# Patient Record
Sex: Female | Born: 1965 | Race: Black or African American | Hispanic: No | Marital: Single | Smoking: Never smoker
Health system: Southern US, Community
[De-identification: ages and names within clinical notes are randomized; demographics above are authoritative.]

## PROBLEM LIST (undated history)

## (undated) DIAGNOSIS — Z931 Gastrostomy status: Secondary | ICD-10-CM

## (undated) DIAGNOSIS — G8929 Other chronic pain: Secondary | ICD-10-CM

## (undated) DIAGNOSIS — G2582 Stiff-man syndrome: Secondary | ICD-10-CM

## (undated) DIAGNOSIS — I619 Nontraumatic intracerebral hemorrhage, unspecified: Secondary | ICD-10-CM

## (undated) DIAGNOSIS — E46 Unspecified protein-calorie malnutrition: Secondary | ICD-10-CM

---

## 2007-03-03 ENCOUNTER — Other Ambulatory Visit: Payer: Self-pay

## 2007-03-03 ENCOUNTER — Emergency Department: Payer: Self-pay | Admitting: Emergency Medicine

## 2007-04-06 ENCOUNTER — Emergency Department: Payer: Self-pay

## 2007-07-12 ENCOUNTER — Emergency Department: Payer: Self-pay | Admitting: Emergency Medicine

## 2007-08-04 ENCOUNTER — Inpatient Hospital Stay: Payer: Self-pay | Admitting: Internal Medicine

## 2007-08-04 ENCOUNTER — Other Ambulatory Visit: Payer: Self-pay

## 2007-08-25 ENCOUNTER — Encounter: Payer: Self-pay | Admitting: Physical Medicine & Rehabilitation

## 2007-09-01 ENCOUNTER — Encounter: Payer: Self-pay | Admitting: Physical Medicine & Rehabilitation

## 2007-09-05 ENCOUNTER — Emergency Department: Payer: Self-pay | Admitting: Emergency Medicine

## 2007-09-05 ENCOUNTER — Other Ambulatory Visit: Payer: Self-pay

## 2007-10-01 ENCOUNTER — Encounter: Payer: Self-pay | Admitting: Physical Medicine & Rehabilitation

## 2007-10-10 ENCOUNTER — Other Ambulatory Visit: Payer: Self-pay

## 2007-10-10 ENCOUNTER — Inpatient Hospital Stay: Payer: Self-pay | Admitting: Internal Medicine

## 2007-11-01 ENCOUNTER — Encounter: Payer: Self-pay | Admitting: Physical Medicine & Rehabilitation

## 2007-11-16 ENCOUNTER — Emergency Department (HOSPITAL_COMMUNITY): Admission: EM | Admit: 2007-11-16 | Discharge: 2007-11-16 | Payer: Self-pay | Admitting: Emergency Medicine

## 2008-01-23 ENCOUNTER — Emergency Department (HOSPITAL_COMMUNITY): Admission: EM | Admit: 2008-01-23 | Discharge: 2008-01-23 | Payer: Self-pay | Admitting: Emergency Medicine

## 2008-01-24 ENCOUNTER — Inpatient Hospital Stay (HOSPITAL_COMMUNITY): Admission: EM | Admit: 2008-01-24 | Discharge: 2008-01-31 | Payer: Self-pay | Admitting: Emergency Medicine

## 2008-02-01 ENCOUNTER — Emergency Department (HOSPITAL_COMMUNITY): Admission: EM | Admit: 2008-02-01 | Discharge: 2008-02-01 | Payer: Self-pay | Admitting: Emergency Medicine

## 2009-12-07 ENCOUNTER — Inpatient Hospital Stay: Payer: Self-pay | Admitting: Specialist

## 2010-10-15 NOTE — Consult Note (Signed)
Katherine Fields, Katherine Fields             ACCOUNT NO.:  [id number]   MEDICAL RECORD NO.:  [id number]          PATIENT TYPE:  INP   LOCATION:  1227                         FACILITY:  [facility]   PHYSICIAN:  Levert Feinstein, MD          DATE OF BIRTH:  [birth year]   DATE OF CONSULTATION:  01/24/2008  DATE OF DISCHARGE:                                 CONSULTATION   CHIEF COMPLAINT:  Recurrent seizure.   HISTORY OF PRESENT ILLNESS:  The patient is a 45 year old African  American female, nursing home resident, I gathered history from  reviewing previous [facility] admission note, and talking with the patient, and  her daughter at [phone].  The daughter's name is Pearletha Forge.   The patient apparently suffered epilepsy since age 69, and per [facility]  record, it was partial onset, and previous EEG in December 08, 2006 has  documented frequent spike in the right hemisphere, mostly localized to  the right temporal head region.   The patient's daughter reported  different types of seizure.  She was  running and standing for hours previously.  Currently her seizure  changed to foaming, tongue biting, whole body shaking.  The patient  denies any warning signs prior to the seizure onset.   Apparently she had frequent recurrent seizure in May 2009, was admitted  to the [facility], later discharged to current [facility] nursing facility since that admission in May.   The patient also has profound ataxic speech, was able to provide limited  history, emotional, and reluctant to elaborate on the detail.  She had a  history of gradual onset of gait difficulty, speech difficulty, and  dysphagia over the past 2 years, had extensive evaluation at [facility] and  [facility] in the past, but I do not have any of the records.  Per daughter,  she has multiple brain scans, and EEGs.  By reviewing the record,  she  has tried different medications, Dilantin, Tegretol, Keppra, Lamictal,  but upon this admission she is taking  Dilantin 200 mg b.i.d., zonegran  100mg  qhs, and also Valium 2 mg every 8 hours.   REVIEW OF SYSTEMS:  She denies chest pain, headache.  She denies visual  change.   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  Also hypertension.   PAST SURGICAL HISTORY:  Unknown.   FAMILY HISTORY:  Unknown.   CURRENT MEDICATIONS:  Including Rocephin, Valium, folic acid, Protonix,  Dilantin and thiamine, Tylenol, Ativan.  Supposed taking Zonegran 50 mg  b.i.d.   ALLERGIES:  NO KNOWN DRUG ALLERGY.   PHYSICAL EXAMINATION:  VITAL SIGNS:  Temperature was 98.7, blood  pressure 118/80, heart rate of 93, respiration of 20.  CARDIAC:  Regular rate and rhythm.  PULMONARY:  Clear to auscultation bilaterally.  NECK:  Has profound rigidity, but no carotid bruits.  NEUROLOGIC:  She is awake, alert, and cooperative on most of the history  taking and neurological examination, but has profound, ataxic speech.  Cranial nerves 2-12, pupils  were equal, round, reactive to light, I was  not able to visualize fundi.  Difficult to test visual field testing.  Smooth pursuit was broken into small catch up saccade. She also has  dysmetric saccadic movement.  There was limitation on leftward gaze,  with end-gaze nystagmus.  Seems to do better with rightward gaze.  She  has right facial droopy, moderate eye closure, cheek puff weakness.  There was no tongue atrophy, deviated to the right side, there was slow  ataxic tongue movements.  She also had scant speech.  Head turning was  rigid, but fairly symmetric.  Motor examination: normal tone of  bilateral upper extremity.  Moderate to severe spasticity of bilateral  lower extremity.  Upper extremity motor strength is at least 4+/5  proximal and distally.  Bilateral lower extremity, slow, deliberate  movement.  Motor strength is at least 4/5, proximal  distally is 5/5.  Sensory was normal to light touch, pinprick and deep tendon reflex:  hyperreflexia, and plantar responses were flexor bilaterally.  Coordination:  She has dysmetria in finger-to-nose, and mild to degree  heel-to-shin.  Gait was deferred.   CT of the brain without contrast revealed, which was essentially normal.  In specific, there is no profound brainstem/cerebellum atrophy noticed.  There was also no caudate atrophy.   LAB EVALUATION:  Lab evaluation normal CMP, and normal CBC, and CBC has  demonstrated elevated RDW of 28.6.   ASSESSMENT:  A 45 year old [place] American female with previous history  of epilepsy, complex partial, presenting with increased seizure  frequency.  She is supposed to take Dilantin 200 mg b.i.d., Valium,, and  Zonogram at nursing home.  Has tried different antiepileptic agents in  the past.   1. Dilantin would not be a good choice.  The patient already has signs      of cerebellum/brain stem dysfunction, Dilantin will  exacerbate her      abnormality, and she already had gingival hypertrophy as well,      which is a long-term adverse effect from Dilantin.  In addition it      is an enzyme inducer.  2. Keppra might be a better choice.  We will load her with 500 and      starting on 500 b.i.d.  3. Keep Zonogram 100 mg every day.  4. She has apparently underwent extensive neurological evaluation in      the past at [facility] and [facility],  no need for another round of evaluation.  5. Talk with daughter.  She is planning on followup with neurology at      [facility], Dr. Jens Som,  in the near future.      Levert Feinstein, MD  Electronically Signed     YY/MEDQ  D:  01/24/2008  T:  01/24/2008  Job:  [id number]

## 2010-10-15 NOTE — Discharge Summary (Signed)
Katherine Fields, Katherine Fields             ACCOUNT NO.:  [id number]   MEDICAL RECORD NO.:  [id number]          PATIENT TYPE:  INP   LOCATION:  1402                         FACILITY:  [facility]   PHYSICIAN:  Elliot Cousin, M.D.    DATE OF BIRTH:  1966/03/31   DATE OF ADMISSION:  01/24/2008  DATE OF DISCHARGE:  01/31/2008                               DISCHARGE SUMMARY   DISCHARGE DIAGNOSES:  1. Epilepsy/complex partial seizures with generalization and recurrent      breakthrough seizures prior to the hospitalization.  2. 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  An EEG was not performed because of the patients wig being      stuck to her scalp.  3. Central nervous system degenerative disorder of unknown etiology      represented by cognitive deficit, gait disorder, increase in      muscular tone and rigidity, and dysmetria.  The patient had been      evaluated extensively by the neurology at [facility]      in the past.  No obvious etiology was ascertained.  4. Urinary tract infection, treated completely with Rocephin during      the hospitalization.   CONSULTATIONS:  Levert Feinstein, M.D.   PROCEDURE PERFORMED:  1. Chest x-ray on January 24, 2008.  The results revealed stable      minimal chronic bronchitic changes.  No acute abnormality.  2. Bilateral rib films.  Results were negative.  3. CT scan of the head on January 23, 2008.  The results revealed      negative for bleed or other acute intracranial process.  4. X-ray of left knee on January 23, 2008.  The results revealed no      left knee acute fracture or subluxation.  5. Left hip x-ray on January 23, 2008.  The results were negative.   HISTORY OF PRESENT ILLNESS:  The patient is a 45 year old woman with a  past medical history significant for complex  partial seizures and a  central nervous system degenerative disorder who presented to the  emergency department from [facility] for  recurrent seizures.  When the patient was evaluated in the emergency  department, she was lethargic and uncooperative with the exam.  She was  afebrile.  However, she was mildly tachycardiac.  The CT scan of her  head revealed no acute intracranial abnormalities.  Her lab data were  significant for a Dilantin level of 10.1 and an urinalysis that was  consistent with infection.  The patient was admitted for further  evaluation and management.   For additional details please see the dictated history and physical.   HOSPITAL COURSE:  1. 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      The patient was placed on seizure precautions.  The dosing of the  Valium and Dilantin was increased.  The Zonegran was continued at      100 mg nightly.  For further evaluation, neurologist Dr. [name] was      consulted.  She provided her consultation on January 24, 2008.  She      recommended discontinuing the Dilantin given that the patient      already had signs of cerebellum and brain stem dysfunction.  The      Dilantin would exacerbate these abnormalities.  It was also noted      by Dr. [name] that the patient had evidence of gingival hypertrophy,      as well which is a longstanding adverse side effect from Dilantin.      Dr. [name], therefore, recommended Keppra as a better choice starting      at 500 mg b.i.d.  She concurred with continuing the Zonegran      therapy.  In review of the records from [facility], the patient      had been treated in the past with a number of anticonvulsant      medications including Dilantin, Tegretol, Keppra, and Lamictal.      Dr. [name] ordered an EEG.  However when the EEG was attempted, the      technician could not take off the  patients wig as it was stuck to      her scalp.  Therefore, the EEG was aborted.  During the      hospitalization, the patient was witnessed to have two seizures in      the first 2 to 3 days of the hospitalization.  Therefore, the      patient was given a loading dose of intravenous Keppra followed by      an increase in the dose of oral Keppra to 750 mg b.i.d.  The      Zonegran was inadvertently increased to b.i.d.  When this was      realized, the Zonegran was decreased back to 100 mg nightly.  Over      the past 24 to 48 hours, the patient has demonstrated multiple      episodes of whole body shaking.  According to the nursing staff,      during these shaking episodes, the patient is completely alert and      very often requesting Ativan; there had been no post-ictal      sedation.  These episodes were felt to represent pseudoseizures and      not actual seizures.  The patient has somewhat of an abnormal      affect and often demonstrates abnormal muscular movements.      According to the notes sent from [facility], the patient has some      sort of neurological disorder that has not really been clarified,      although apparently she has been evaluated extensively there.  The      patient has dysarthria and abnormal movements of her upper and      lower extremities.  She was noted to have difficulty with      ambulation.  Often, she has dystonic movements with increased tone.      Because of increased sleepiness and dysarthria, the dose of the      Valium was decreased back to 2 mg b.i.d. rather than 2 mg t.i.d.      P.r.n. Ativan was used sparingly throughout the hospitalization for      breakthrough seizures.  2.  URINARY TRACT INFECTION.  The patient had evidence of leukocytes in      her urine.  She was started empirically on Rocephin.  Her urine      culture grew out greater than 100,000 colonies of multiple      bacteria, none predominant.  Blood cultures were ordered and       remained negative during the hospitalization.  After 5 days of      therapy, Rocephin was discontinued.   DISCHARGE MEDICATIONS:  1. Zonegran 100 mg nightly.  2. Valium 2 mg b.i.d.  3. Keppra 750 mg b.i.d.  4. Folic acid 1 mg daily.  5. Protonix 40 mg daily.  6. MiraLax laxative 17 grams in 8 ounces of favorite beverage daily.  7. Vitamin B1 (thiamine) 100 mg daily.  8. Tylenol 650 mg every 4 hours as needed for pain and fever.  9. Ativan 0.5 mg IM/p.o. t.i.d. p.r.n.  10.Stop Dilantin.   DISCHARGE DISPOSITION:  The patient is currently stable.  The plan is to  discharge her to [facility] tomorrow for  ongoing monitoring and therapies.      Elliot Cousin, M.D.  Electronically Signed     DF/MEDQ  D:  01/30/2008  T:  01/30/2008  Job:  [id number]   cc:   Frederica Kuster, MD  Fax: [phone]   Levert Feinstein, MD

## 2010-10-15 NOTE — H&P (Signed)
Katherine Fields, KISHBAUGH             ACCOUNT NO.:  [id number]   MEDICAL RECORD NO.:  [id number]          PATIENT TYPE:  INP   LOCATION:  0104                         [facility]ACILITY:  [facility]   PHYSICIAN:  Lonia Blood, M.D.       DATE OF BIRTH:  28-Jun-1965   DATE OF ADMISSION:  01/24/2008  DATE OF DISCHARGE:                              HISTORY & PHYSICAL   PRIMARY CARE PHYSICIAN:  Dr. Jacalyn Lefevre   CHIEF COMPLAINT:  Seizure.   HISTORY OF PRESENT ILLNESS:  [name] is a 45 year old woman with  history of epilepsy since age 26, who has been a longstanding patient of  the [facility] Neurology Department, who was sent today to the  emergency room from [facility] where she was  admitted for rehab back in May 2009.  Unfortunately, I have no records  to indicate where she came from to that nursing home or what was the  reason for her qualifying inpatient stay.  The patient is currently  awake, but she displays very disorganized thinking, and she is unable to  provide any history.  I have asked the skilled nursing facility to fax  me all the records but half an hour later, I still have no response.  I  have attempted to call the patient's contact persons listed on the  sheet, and finally I was able to find a sister by the name of Katherine Fields with  a phone number of [phone], who tells me that [name] has had  significant gait disturbance, cognitive deficits, and basically  uncontrolled epilepsy for years and that she would like her to be  referred to [facility] for the brain surgery.  Otherwise, she tells me  that even on a good day, [name] is not fully alert, oriented, or  able to have a good and full conversation with her family.  As she is  not at the bedside, she cannot tell me right now how far from her  baseline [name] is.   PAST MEDICAL HISTORY:  1. Epilepsy.  2. Hypertension.   MEDICATION AT THE NURSING HOME:  Valium, Dilantin, Zonegran,  MiraLax,  and Tylenol.   SOCIAL HISTORY:  The patient denies smoking cigarettes or drinking  alcohol.  She has a listed daughter, but I do not know if she has ever  been married.  The patient supposedly worked in a factory, but I do not  have any details regarding that.   FAMILY HISTORY:  Unobtainable.   REVIEW OF SYSTEMS:  The patient denies any pain.  She will like to be  left alone and denies any nausea, vomiting, and she is able to eat the  happy meal provided by the emergency room staff.   PHYSICAL EXAM UPON ADMISSION:  Temperature is 98.8, blood pressure  131/81, heart rate 110, respiratory rate is 21 saturation of 99% on room  air.  GENERAL APPEARANCE:  An African American woman in some moderate  distress.  She seems to be agitated, disorganized, unable to focus.  Pupils are mydriatic and reactive to light.  There is  no tonic-clonic  movements seen.  The patient is not oriented to place or situation.  She  does not know the month, date or time and appears normocephalic,  atraumatic.  Throat clear.  NECK:  Supple.  No JVD.  CHEST:  Clear without wheezes, rhonchi or crackles.  HEART:  Regular rate and rhythm without murmurs, rubs or gallops.  ABDOMEN:  Soft, nontender.  Bowel sounds are present.  LOWER EXTREMITIES:  Without edema.  Unable to perform a neurological exam.  I am unable to perform a full  exam due to the patient's uncooperation.  She seems to be having some  dystonic movements with her head, and she will cooperate some by  squeezing my finger with both hands.  I would did not ambulate the  patient.   LABORATORY VALUES ON ADMISSION:  White blood cell count is 5000,  hemoglobin 12, platelet count 229, phenytoin 10, sodium 136, potassium  3.5, chloride 107, glucose 71, BUN 9, creatinine 1, bicarbonate 22.  LFTs within normal limits.  UA positive for leukocyte esterase and  bacteria.   ASSESSMENT/PLAN:  1. Seizures - rule out status epilepticus.  The patient  does not seem      to be returning back to a state of complete consciousness.  I am      afraid that she might be having some ongoing partial seizures      versus she has significant cognitive and neurological deficits.      Obviously the whole exam and evaluation will have to be tailored      once I get the family members to tell me about the patient's      baseline.  I am also trying hard to get records from [facility] and [facility] to further help me      out for this patient.  For now, I will increase the patient's      Valium, Dilantin, continue her Zonegran and use Ativan as needed      for obvious tonic-clonic seizures.  I have asked to consult a      neurologist to see the patient in the emergency room, and I also      ordered an EEG.  2. Possible urinary tract infection.  I will send the urine culture      and placed empirically the patient on Rocephin.  3. Deep venous thrombosis prophylaxis will be done using PAS hoses.      Lonia Blood, M.D.  Electronically Signed     SL/MEDQ  D:  01/24/2008  T:  01/24/2008  Job:  [id number]

## 2011-02-27 LAB — COMPREHENSIVE METABOLIC PANEL
BUN: 8
CO2: 24
Calcium: 9.2
Chloride: 104
Creatinine, Ser: 1.07
GFR calc Af Amer: >60
GFR calc non Af Amer: 57 — ABNORMAL LOW
Glucose, Bld: 71
Total Bilirubin: 0.4

## 2011-02-27 LAB — CBC
HCT: 29.8 — ABNORMAL LOW
Hemoglobin: 9.7 — ABNORMAL LOW
MCHC: 32.5
MCV: 74.5 — ABNORMAL LOW
RBC: 4

## 2011-02-27 LAB — DIFFERENTIAL
Basophils Absolute: 0
Lymphocytes Relative: 19
Lymphs Abs: 0.8
Neutro Abs: 3.1
Neutrophils Relative %: 72

## 2011-02-27 LAB — URINALYSIS, ROUTINE W REFLEX MICROSCOPIC
Glucose, UA: NEGATIVE
Nitrite: NEGATIVE
Protein, ur: 30 — AB

## 2011-02-27 LAB — PHENYTOIN LEVEL, TOTAL: Phenytoin Lvl: 12

## 2011-02-27 LAB — URINE MICROSCOPIC-ADD ON

## 2011-03-05 LAB — DIFFERENTIAL
Basophils Relative: 1
Eosinophils Absolute: 0
Eosinophils Relative: 0
Lymphs Abs: 0.9
Monocytes Absolute: 0.2
Neutrophils Relative %: 75

## 2011-03-05 LAB — COMPREHENSIVE METABOLIC PANEL
ALT: 22
AST: 20
Alkaline Phosphatase: 74
CO2: 25
Calcium: 9.7
GFR calc Af Amer: >60
Glucose, Bld: 106 — ABNORMAL HIGH
Potassium: 3.7
Sodium: 139
Total Protein: 7.3

## 2011-03-05 LAB — CBC
Hemoglobin: 13
RBC: 4.97

## 2011-03-05 LAB — PHENYTOIN LEVEL, TOTAL: Phenytoin Lvl: <2.5 — ABNORMAL LOW

## 2011-03-05 LAB — GLUCOSE, CAPILLARY: Glucose-Capillary: 69 — ABNORMAL LOW

## 2011-09-21 ENCOUNTER — Emergency Department: Payer: Self-pay | Admitting: Emergency Medicine

## 2011-09-21 LAB — COMPREHENSIVE METABOLIC PANEL
Albumin: 4 g/dL (ref 3.4–5.0)
Alkaline Phosphatase: 87 U/L (ref 50–136)
Bilirubin,Total: 0.4 mg/dL (ref 0.2–1.0)
Creatinine: 1.23 mg/dL (ref 0.60–1.30)
EGFR (African American): >60
EGFR (Non-African Amer.): 53 — ABNORMAL LOW
Osmolality: 280 (ref 275–301)

## 2011-09-21 LAB — URINALYSIS, COMPLETE
Bilirubin,UR: NEGATIVE
Glucose,UR: NEGATIVE mg/dL (ref 0–75)
Leukocyte Esterase: NEGATIVE
Nitrite: NEGATIVE
Ph: 5 (ref 4.5–8.0)

## 2011-09-21 LAB — CBC
HCT: 27 % — ABNORMAL LOW (ref 35.0–47.0)
RBC: 3.8 10*6/uL (ref 3.80–5.20)
WBC: 7.5 10*3/uL (ref 3.6–11.0)

## 2011-09-21 LAB — ETHANOL
Ethanol %: <0.003 % (ref 0.000–0.080)
Ethanol: <3 mg/dL

## 2011-09-21 LAB — TROPONIN I: Troponin-I: 0.04 ng/mL

## 2011-09-21 LAB — PROTIME-INR: Prothrombin Time: 14 secs (ref 11.5–14.7)

## 2011-10-21 ENCOUNTER — Emergency Department: Payer: Self-pay | Admitting: Emergency Medicine

## 2011-10-21 LAB — CBC WITH DIFFERENTIAL/PLATELET
Basophil: 1 %
Comment - H1-Com4: NORMAL
MCH: 22.4 pg — ABNORMAL LOW (ref 26.0–34.0)
MCV: 73 fL — ABNORMAL LOW (ref 80–100)
Monocytes: 6 %
Platelet: 378 10*3/uL (ref 150–440)
Segmented Neutrophils: 80 %
WBC: 8.6 10*3/uL (ref 3.6–11.0)

## 2011-10-21 LAB — BASIC METABOLIC PANEL
BUN: 11 mg/dL (ref 7–18)
Co2: 24 mmol/L (ref 21–32)
EGFR (African American): >60
EGFR (Non-African Amer.): >60
Potassium: 3.9 mmol/L (ref 3.5–5.1)
Sodium: 137 mmol/L (ref 136–145)

## 2011-10-22 LAB — URINALYSIS, COMPLETE
Bacteria: NONE SEEN
Bilirubin,UR: NEGATIVE
Blood: NEGATIVE
Glucose,UR: NEGATIVE mg/dL (ref 0–75)
Ketone: NEGATIVE
Leukocyte Esterase: NEGATIVE
Nitrite: NEGATIVE
Ph: 8 (ref 4.5–8.0)
Protein: NEGATIVE
RBC,UR: <1 /HPF (ref 0–5)
Specific Gravity: 1.006 (ref 1.003–1.030)
Squamous Epithelial: 2
WBC UR: <1 /HPF (ref 0–5)

## 2011-10-31 ENCOUNTER — Emergency Department: Payer: Self-pay | Admitting: Emergency Medicine

## 2011-10-31 LAB — CBC
HCT: 28.6 % — ABNORMAL LOW (ref 35.0–47.0)
HGB: 8.7 g/dL — ABNORMAL LOW (ref 12.0–16.0)
MCHC: 30.3 g/dL — ABNORMAL LOW (ref 32.0–36.0)
MCV: 72 fL — ABNORMAL LOW (ref 80–100)

## 2011-10-31 LAB — COMPREHENSIVE METABOLIC PANEL
Anion Gap: 13 (ref 7–16)
BUN: 9 mg/dL (ref 7–18)
Bilirubin,Total: 0.3 mg/dL (ref 0.2–1.0)
Calcium, Total: 9.3 mg/dL (ref 8.5–10.1)
Co2: 22 mmol/L (ref 21–32)
Creatinine: 0.87 mg/dL (ref 0.60–1.30)
EGFR (Non-African Amer.): >60
Potassium: 3.9 mmol/L (ref 3.5–5.1)
SGOT(AST): 18 U/L (ref 15–37)
Total Protein: 8 g/dL (ref 6.4–8.2)

## 2011-10-31 LAB — URINALYSIS, COMPLETE
Hyaline Cast: 12
Leukocyte Esterase: NEGATIVE
Nitrite: NEGATIVE
Protein: 100
RBC,UR: 1 /HPF (ref 0–5)
Squamous Epithelial: 3

## 2011-10-31 LAB — PHENYTOIN LEVEL, TOTAL: Dilantin: <0.4 ug/mL — ABNORMAL LOW (ref 10.0–20.0)

## 2012-10-06 DIAGNOSIS — Z931 Gastrostomy status: Secondary | ICD-10-CM

## 2014-03-02 DIAGNOSIS — I69359 Hemiplegia and hemiparesis following cerebral infarction affecting unspecified side: Secondary | ICD-10-CM

## 2014-10-03 ENCOUNTER — Encounter: Payer: Self-pay | Admitting: Emergency Medicine

## 2014-10-03 ENCOUNTER — Emergency Department: Payer: Medicaid Other

## 2014-10-03 ENCOUNTER — Emergency Department
Admission: EM | Admit: 2014-10-03 | Discharge: 2014-10-03 | Disposition: A | Discharge status: Home / Self Care | Payer: Medicaid Other | Attending: Emergency Medicine | Admitting: Emergency Medicine

## 2014-10-03 DIAGNOSIS — Z431 Encounter for attention to gastrostomy: Secondary | ICD-10-CM

## 2014-10-03 DIAGNOSIS — T85528A Displacement of other gastrointestinal prosthetic devices, implants and grafts, initial encounter: Secondary | ICD-10-CM

## 2014-10-03 DIAGNOSIS — K9429 Other complications of gastrostomy: Secondary | ICD-10-CM | POA: Insufficient documentation

## 2014-10-03 HISTORY — DX: Gastrostomy status: Z93.1

## 2014-10-03 HISTORY — DX: Stiff-man syndrome: G25.82

## 2014-10-03 MED ORDER — DIATRIZOATE MEGLUMINE & SODIUM 66-10 % PO SOLN
30.0000 mL | Freq: Once | ORAL | Status: AC
  Administered 2014-10-03: 30 mL

## 2014-10-03 NOTE — ED Provider Notes (Signed)
[facility] Emergency Department Provider Note   Time seen: 8:30 PM  I have reviewed the triage vital signs and the nursing notes. Level V caveat. Exam limited by nonverbal status.  HISTORY  Chief Complaint GI Problem    HPI Katherine Fields is a 49 y.o. female who is brought in by EMS from home for G-tube displacement. According to EMS, for patient is nonverbal, the tube was displaced from 1930 tonight. The original size 20 [place]. Patient states she is not in any pain by nodding her head. G-tubes load located in the upper abdomen. She does not take food or liquid by mouth requires tube feeds for everything.    No past medical history on file.  There are no active problems to display for this patient.   No past surgical history on file.  No current outpatient prescriptions on file.  Allergies Review of patient's allergies indicates not on file.  No family history on file.  Social History History  Substance Use Topics  . Smoking status: Not on file  . Smokeless tobacco: Not on file  . Alcohol Use: Not on file    Review of Systems Unknown, patient states she is not in any pain by nodding her head  ____________________________________________   PHYSICAL EXAM:  VITAL SIGNS: ED Triage Vitals  Enc Vitals Group     BP 10/03/14 2035 157/97 mmHg     Pulse Rate 10/03/14 2035 64     Resp 10/03/14 2035 16     Temp 10/03/14 2035 97.6 F (36.4 C)     Temp Source 10/03/14 2035 Oral     SpO2 10/03/14 2035 97 %     Weight 10/03/14 2035 126 lb (57.153 kg)     Height 10/03/14 2035  (1.651 m)     Head Cir --      Peak Flow --      Pain Score --      Pain Loc --      Pain Edu? --      Excl. in GC? --     Constitutional: Alert and oriented. Well appearing and in no distress. Eyes: Conjunctivae are normal. PERRL. Normal extraocular movements. ENT    Mouth/Throat: Mucous membranes are moist.   Neck: No stridor.  Cardiovascular:  Normal rate, regular rhythm. Normal and symmetric distal pulses are present in all extremities. No murmurs, rubs, or gallops. Respiratory: Normal respiratory effort without tachypnea nor retractions. Breath sounds are clear and equal bilaterally. No wheezes/rales/rhonchi. Gastrointestinal: Soft and nontender. G-tube site appears to be rapidly closing  Musculoskeletal: Nontender with normal range of motion in all extremities. No joint effusions.  No lower extremity tenderness nor edema. Neurologic:  Nonverbal  Skin:  Skin is warm, dry and intact. No rash noted.   ____________________________________________    LABS (pertinent positives/negatives)  None  ____________________________________________  20 French G-tube was replaced with some difficulty and secured in proper position after inflating the balloon with 20 cc of normal saline. X-ray was ordered which confirmed proper placement.     RADIOLOGY  KUB confirms G-tube placement  ____________________________________________    IMPRESSION / ASSESSMENT AND PLAN / ED COURSE  Pertinent labs & imaging results that were available during my care of the patient were reviewed by me and considered in my medical decision making (see chart for details).  G-tube dislodgment and replacement.  Patient presents for G-tube replacement which was placed with some difficulty were was successful. Stable for outpatient follow-up  Emily FilbertWilliams, Jonathan E, MD   Emily FilbertJonathan E Klumpp, MD 10/03/14 2125

## 2014-10-03 NOTE — Discharge Instructions (Signed)
Gastric Tube Replacement °You are having your gastric tube (the tube that goes into the stomach) changed. This is usually a very minor procedure. If medications are prescribed, take them as directed. Only take over-the-counter or prescription medications for pain, discomfort, or fever as directed by your caregiver.  °SEEK IMMEDIATE MEDICAL CARE IF:  °· You develop chills, fever, or show signs of generalized illness. °· You develop bleeding around the tube. °· Your new tube does not seem to be working properly. °· You are unable to get feedings into the tube. °· Your tube comes out for any reason. °Document Released: 02/11/2001 Document Revised: 08/11/2011 Document Reviewed: 05/19/2005 °ExitCare® Patient Information ©2015 [employer]. This information is not intended to replace advice given to you by your health care provider. Make sure you discuss any questions you have with your health care provider. ° °

## 2014-10-03 NOTE — ED Notes (Signed)
Pt to ED via EMS from home c/o g-tube displacement.  EMS states displaced around 1930 tonight, original size 71F.

## 2014-10-03 NOTE — ED Notes (Signed)
XR at bedside

## 2014-10-18 DIAGNOSIS — I69391 Dysphagia following cerebral infarction: Secondary | ICD-10-CM

## 2015-04-11 ENCOUNTER — Emergency Department: Payer: Medicaid Other

## 2015-04-11 ENCOUNTER — Emergency Department
Admission: EM | Admit: 2015-04-11 | Discharge: 2015-04-11 | Disposition: A | Discharge status: Home / Self Care | Payer: Medicaid Other | Attending: Emergency Medicine | Admitting: Emergency Medicine

## 2015-04-11 DIAGNOSIS — Z431 Encounter for attention to gastrostomy: Secondary | ICD-10-CM | POA: Diagnosis not present

## 2015-04-11 DIAGNOSIS — Z79899 Other long term (current) drug therapy: Secondary | ICD-10-CM | POA: Insufficient documentation

## 2015-04-11 DIAGNOSIS — Z931 Gastrostomy status: Secondary | ICD-10-CM

## 2015-04-11 DIAGNOSIS — R109 Unspecified abdominal pain: Secondary | ICD-10-CM | POA: Diagnosis present

## 2015-04-11 LAB — CBC WITH DIFFERENTIAL/PLATELET
BASOS ABS: 0 10*3/uL (ref 0–0.1)
Basophils Relative: 1 %
EOS ABS: 0 10*3/uL (ref 0–0.7)
Eosinophils Relative: 0 %
HCT: 36.7 % (ref 35.0–47.0)
HEMOGLOBIN: 12.1 g/dL (ref 12.0–16.0)
LYMPHS ABS: 1.2 10*3/uL (ref 1.0–3.6)
Lymphocytes Relative: 21 %
MCH: 29.4 pg (ref 26.0–34.0)
MCHC: 33 g/dL (ref 32.0–36.0)
MCV: 89.2 fL (ref 80.0–100.0)
Monocytes Absolute: 0.3 10*3/uL (ref 0.2–0.9)
Monocytes Relative: 6 %
NEUTROS PCT: 72 %
Neutro Abs: 4.3 10*3/uL (ref 1.4–6.5)
PLATELETS: 214 10*3/uL (ref 150–440)
RBC: 4.11 MIL/uL (ref 3.80–5.20)
RDW: 14 % (ref 11.5–14.5)
WBC: 5.9 10*3/uL (ref 3.6–11.0)

## 2015-04-11 LAB — COMPREHENSIVE METABOLIC PANEL
ALK PHOS: 72 U/L (ref 38–126)
ALT: 8 U/L — AB (ref 14–54)
AST: 24 U/L (ref 15–41)
Albumin: 3.7 g/dL (ref 3.5–5.0)
Anion gap: 6 (ref 5–15)
BUN: 14 mg/dL (ref 6–20)
CALCIUM: 9.8 mg/dL (ref 8.9–10.3)
CO2: 29 mmol/L (ref 22–32)
CREATININE: 0.88 mg/dL (ref 0.44–1.00)
Chloride: 104 mmol/L (ref 101–111)
GFR calc Af Amer: >60 mL/min (ref >60)
GFR calc non Af Amer: >60 mL/min (ref >60)
Glucose, Bld: 78 mg/dL (ref 65–99)
Potassium: 5.3 mmol/L — ABNORMAL HIGH (ref 3.5–5.1)
Sodium: 139 mmol/L (ref 135–145)
TOTAL PROTEIN: 7.4 g/dL (ref 6.5–8.1)
Total Bilirubin: 0.7 mg/dL (ref 0.3–1.2)

## 2015-04-11 LAB — URINALYSIS COMPLETE WITH MICROSCOPIC (ARMC ONLY)
Bilirubin Urine: NEGATIVE
Glucose, UA: NEGATIVE mg/dL
Hgb urine dipstick: NEGATIVE
KETONES UR: NEGATIVE mg/dL
Leukocytes, UA: NEGATIVE
Nitrite: NEGATIVE
PROTEIN: NEGATIVE mg/dL
RBC / HPF: NONE SEEN RBC/hpf (ref 0–5)
Specific Gravity, Urine: 1.019 (ref 1.005–1.030)
Squamous Epithelial / LPF: NONE SEEN
pH: 7 (ref 5.0–8.0)

## 2015-04-11 LAB — LIPASE, BLOOD: Lipase: 38 U/L (ref 11–51)

## 2015-04-11 MED ORDER — HYDROMORPHONE HCL 1 MG/ML IJ SOLN
1.0000 mg | Freq: Once | INTRAMUSCULAR | Status: AC
  Administered 2015-04-11: 1 mg via INTRAVENOUS
  Filled 2015-04-11: qty 1

## 2015-04-11 MED ORDER — ONDANSETRON 4 MG PO TBDP
4.0000 mg | ORAL_TABLET | Freq: Once | ORAL | Status: AC
  Administered 2015-04-11: 4 mg via ORAL
  Filled 2015-04-11: qty 1

## 2015-04-11 MED ORDER — LORAZEPAM 2 MG/ML IJ SOLN
2.0000 mg | Freq: Once | INTRAMUSCULAR | Status: AC
  Administered 2015-04-11: 2 mg via INTRAMUSCULAR
  Filled 2015-04-11: qty 1

## 2015-04-11 NOTE — ED Notes (Signed)
Bladder scan completed per MD request.  106 ml noted

## 2015-04-11 NOTE — Care Management Note (Signed)
Case Management Note  Patient Details  Name: Katherine Fields MRN: [id number] Date of Birth: 25-Sep-1965  Subjective/Objective:     Contacted daughter of patient, Tamera PuntMiranda by calling her At [phone] . She let the phone ring and answered my VM left. Dr Huel CoteQuigley asked if the daughter will come get the patient, or come sit with her. She says she will come to get the patient.              Action/Plan:   Expected Discharge Date:                  Expected Discharge Plan:     In-House Referral:     Discharge planning Services     Post Acute Care Choice:    Choice offered to:     DME Arranged:    DME Agency:     HH Arranged:    HH Agency:     Status of Service:     Medicare Important Message Given:    Date Medicare IM Given:    Medicare IM give by:    Date Additional Medicare IM Given:    Additional Medicare Important Message give by:     If discussed at Long Length of Stay Meetings, dates discussed:    Additional Comments:  Berna BueCheryl Brailon Don, RN 04/11/2015, 1:54 PM

## 2015-04-11 NOTE — ED Notes (Signed)
Patient brought in via EMS from home for abdominal pain and G-tube "falling out".

## 2015-04-11 NOTE — ED Notes (Signed)
Spoke with indo, for Kangaroo [id number], 20cc G-Tube. Indo to call back.

## 2015-04-11 NOTE — Discharge Instructions (Signed)
Gastrostomy Tube Home Guide, Adult °A gastrostomy tube is a tube that is surgically placed into the stomach. It is also called a "G-tube." G-tubes are used when a person is unable to eat and drink enough on their own to stay healthy. The tube is inserted into the stomach through a small cut (incision) in the skin. This tube is used for: °· Feeding. °· Giving medication. °GASTROSTOMY TUBE CARE °· Wash your hands with soap and water. °· Remove the old dressing (if any). Some styles of G-tubes may need a dressing inserted between the skin and the G-tube. Other types of G-tubes do not require a dressing. Ask your health care provider if a dressing is needed. °· Check the area where the tube enters the skin (insertion site) for redness, swelling, or pus-like (purulent) drainage. A small amount of clear or tan liquid drainage is normal. Check to make sure scar tissue (skin) is not growing around the insertion site. This could have a raised, bumpy appearance. °· A cotton swab can be used to clean the skin around the tube: °¨ When the G-tube is first put in, a normal saline solution or water can be used to clean the skin. °¨ Mild soap and warm water can be used when the skin around the G-tube site has healed. °¨ Roll the cotton swab around the G-tube insertion site to remove any drainage or crusting at the insertion site. °STOMACH RESIDUALS °Feeding tube residuals are the amount of liquids that are in the stomach at any given time. Residuals may be checked before giving feedings, medications, or as instructed by your health care provider. °· Ask your health care provider if there are instances when you would not start tube feedings depending on the amount or type of contents withdrawn from the stomach. °· Check residuals by attaching a syringe to the G-tube and pulling back on the syringe plunger. Note the amount, and return the residual back into the stomach. °FLUSHING THE G-TUBE °· The G-tube should be periodically  flushed with clean warm water to keep it from clogging. °¨ Flush the G-tube after feedings or medications. Draw up 30 mL of warm water in a syringe. Connect the syringe to the G-tube and slowly push the water into the tube. °¨ Do not push feedings, medications, or flushes rapidly. Flush the G-tube gently and slowly. °¨ Only use syringes made for G-tubes to flush medications or feedings. °¨ Your health care provider may want the G-tube flushed more often or with more water. If this is the case, follow your health care provider's instructions. °FEEDINGS °Your health care provider will determine whether feedings are given as a bolus (a certain amount given at one time and at scheduled times) or whether feedings will be given continuously on a feeding pump.  °· Formulas should be given at room temperature. °· If feedings are continuous, no more than 4 hours worth of feedings should be placed in the feeding bag. This helps prevent spoilage or accidental excess infusion. °· Cover and place unused formula in the refrigerator. °· If feedings are continuous, stop the feedings when medications or flushes are given. Be sure to restart the feedings. °· Feeding bags and syringes should be replaced as instructed by your health care provider. °GIVING MEDICATION  °· In general, it is best if all medications are in a liquid form for G-tube administration. Liquid medications are less likely to clog the G-tube. °¨ Mix the liquid medication with 30 mL (or amount recommended by   your health care provider) of warm water. °¨ Draw up the medication into the syringe. °¨ Attach the syringe to the G-tube and slowly push the mixture into the G-tube. °¨ After giving the medication, draw up 30 mL of warm water in the syringe and slowly flush the G-tube. °· For pills or capsules, check with your health care provider first before crushing medications. Some pills are not effective if they are crushed. Some capsules are sustained-release  medications. °¨ If appropriate, crush the pill or capsule and mix with 30 mL of warm water. Using the syringe, slowly push the medication through the tube, then flush the tube with another 30 mL of tap water. °G-TUBE PROBLEMS °G-tube was pulled out. °· Cause: May have been pulled out accidentally. °· Solutions: Cover the opening with clean dressing and tape. Call your health care provider right away. The G-tube should be put in as soon as possible (within 4 hours) so the G-tube opening (tract) does not close. The G-tube needs to be put in at a health care setting. An X-ray needs to be done to confirm placement before the G-tube can be used again. °Redness, irritation, soreness, or foul odor around the gastrostomy site. °· Cause: May be caused by leakage or infection. °· Solutions: Call your health care provider right away. °Large amount of leakage of fluid or mucus-like liquid present (a large amount means it soaks clothing). °· Cause: Many reasons could cause the G-tube to leak. °· Solutions: Call your health care provider to discuss the amount of leakage. °Skin or scar tissue appears to be growing where tube enters skin.  °· Cause: Tissue growth may develop around the insertion site if the G-tube is moved or pulled on excessively. °· Solutions: Secure tube with tape so that excess movement does not occur. Call your health care provider. °G-tube is clogged. °· Cause: Thick formula or medication. °· Solutions: Try to slowly push warm water into the tube with a large syringe. Never try to push any object into the tube to unclog it. Do not force fluid into the G-tube. If you are unable to unclog the tube, call your health care provider right away. °TIPS °· Head of bed (HOB) position refers to the upright position of a person's upper body. °¨ When giving medications or a feeding bolus, keep the HOB up as told by your health care provider. Do this during the feeding and for 1 hour after the feeding or medication  administration. °¨ If continuous feedings are being given, it is best to keep the HOB up as told by your health care provider. When ADLs (activities of daily living) are performed and the HOB needs to be flat, be sure to turn the feeding pump off. Restart the feeding pump when the HOB is returned to the recommended height. °· Do not pull or put tension on the tube. °· To prevent fluid backflow, kink the G-tube before removing the cap or disconnecting a syringe. °· Check the G-tube length every day. Measure from the insertion site to the end of the G-tube. If the length is longer than previous measurements, the tube may be coming out. Call your health care provider if you notice increasing G-tube length. °· Oral care, such as brushing teeth, must be continued. °· You may need to remove excess air (vent) from the G-tube. Your health care provider will tell you if this is needed. °· Always call your health care provider if you have questions or problems with the   G-tube. SEEK IMMEDIATE MEDICAL CARE IF:   You have severe abdominal pain, tenderness, or abdominal bloating (distension).  You have nausea or vomiting.  You are constipated or have problems moving your bowels.  The G-tube insertion site is red, swollen, has a foul smell, or has yellow or brown drainage.  You have difficulty breathing or shortness of breath.  You have a fever.  You have a large amount of feeding tube residuals.  The G-tube is clogged and cannot be flushed. MAKE SURE YOU:   Understand these instructions.  Will watch your condition.  Will get help right away if you are not doing well or get worse.   This information is not intended to replace advice given to you by your health care provider. Make sure you discuss any questions you have with your health care provider.   Document Released: 07/28/2001 Document Revised: 10/03/2014 Document Reviewed: 01/24/2013 Elsevier Interactive Patient Education [employer]2016 [employer]  Please return immediately if condition worsens. Please contact her primary physician or the physician you were given for referral. If you have any specialist physicians involved in her treatment and plan please also contact them. Thank you for using [facility].

## 2015-04-11 NOTE — ED Notes (Signed)
Pt very tense in room, g-tube placement by MD x2 unsuccessful, IM medications given.

## 2015-04-11 NOTE — ED Notes (Signed)
New G-Tube placement by Dr. Huel CoteQuigley. 16F, 20cc balloon. 2 stitches placed to secure placement.

## 2015-04-11 NOTE — ED Provider Notes (Signed)
Time Seen: Approximately 11 AM  I have reviewed the triage notes  Chief Complaint: Abdominal Pain   History of Present Illness: Katherine Fields is a 49 y.o. 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 Patient apparently had her G-tube out "" fell out "" at home. Patient denies any other physical complaints at this point. She denies any nausea or vomiting though she is a difficult historian and there was no family members to speak with at the bedside. There's been no history stated of fever, persistent vomiting, or any other concerns. The G-tube apparently "" fell out" "" this morning. Examination of the G-tube shows a balloon appears to have ruptured.  Past Medical History  Diagnosis Date  . Stiffman syndrome   . G tube feedings (HCC)     There are no active problems to display for this patient.   History reviewed. No pertinent past surgical history.  History reviewed. No pertinent past surgical history.  Current Outpatient Rx  Name  Route  Sig  Dispense  Refill  . baclofen (LIORESAL) 10 MG tablet   Oral   Take 10 mg by mouth every 6 (six) hours.         . diazepam (VALIUM) 1 MG/ML solution   Per Tube   Place 2 mg into feeding tube every 8 (eight) hours as needed for anxiety.         . DULoxetine (CYMBALTA) 60 MG capsule   Oral   Take 60 mg by mouth daily.         . famotidine (PEPCID) 20 MG tablet   Oral   Take 20 mg by mouth 2 (two) times daily.         Marland Kitchen lacosamide (VIMPAT) 10 MG/ML oral solution   Oral   Take 150 mg by mouth 2 (two) times daily.         Marland Kitchen levETIRAcetam (KEPPRA) 100 MG/ML solution   Oral   Take 750 mg by mouth 2 (two) times daily.         Marland Kitchen lisinopril (PRINIVIL,ZESTRIL) 10 MG tablet   Oral   Take 10 mg by mouth daily.         Marland Kitchen ZONISAMIDE PO   Per Tube   Place 20 mLs into feeding tube 2 (two) times daily. Zonisamide /ml give 20 mls by G tube twice daily           Allergies:  Review of patient's  allergies indicates no known allergies.  Family History: History reviewed. No pertinent family history.  Social History: Social History  Substance Use Topics  . Smoking status: Never Smoker   . Smokeless tobacco: None  . Alcohol Use: No     Review of Systems:   10 point review of systems was performed and was otherwise negative:  Constitutional: No fever Eyes: No visual disturbances ENT: No sore throat, ear pain Cardiac: No chest pain Respiratory: No shortness of breath, wheezing, or stridor Abdomen: No abdominal pain, no vomiting, No diarrhea Endocrine: No weight loss, No night sweats Extremities: No peripheral edema, cyanosis Skin: No rashes, easy bruising Neurologic: No focal weakness, trouble with speech or swollowing Urologic: No dysuria, Hematuria, or urinary frequency No obvious new findings on review of systems is difficult to establish with the patient's baseline is but further discussion with the family and after they arrived did not show any other abnormalities  Physical Exam:  ED Triage Vitals  Enc Vitals Group     BP  04/11/15 1040 145/79 mmHg     Pulse Rate 04/11/15 1040 78     Resp 04/11/15 1040 15     Temp 04/11/15 1040 97.9 F (36.6 C)     Temp Source 04/11/15 1040 Oral     SpO2 04/11/15 1040 100 %     Weight 04/11/15 1040 115 lb (52.164 kg)     Height 04/11/15 1040 5\' 5"  (1.651 m)     Head Cir --      Peak Flow --      Pain Score --      Pain Loc --      Pain Edu? --      Excl. in GC? --     General: Awake , Alert , and Oriented times 3; GCS 15 Head: Normal cephalic , atraumatic Eyes: Pupils equal , round, reactive to light Nose/Throat: No nasal drainage, patent upper airway without erythema or exudate.  Neck: Supple, Full range of motion, No anterior adenopathy or palpable thyroid masses Lungs: Clear to ascultation without wheezes , rhonchi, or rales Heart: Regular rate, regular rhythm without murmurs , gallops , or rubs Abdomen: Soft, non  tender without rebound, guarding , or rigidity; bowel sounds positive and symmetric in all 4 quadrants. No organomegaly .        Extremities: 2 plus symmetric pulses. No edema, clubbing or cyanosis Neurologic: normal ambulation, Motor symmetric without deficits, sensory intact Skin: warm, dry, no rashes   Labs:   All laboratory work was reviewed including any pertinent negatives or positives listed below:  Labs Reviewed  COMPREHENSIVE METABOLIC PANEL - Abnormal; Notable for the following:    Potassium 5.3 (*)    ALT 8 (*)    All other components within normal limits  URINALYSIS COMPLETEWITH MICROSCOPIC ([facility] ONLY) - Abnormal; Notable for the following:    Color, Urine YELLOW (*)    APPearance CLOUDY (*)    Bacteria, UA RARE (*)    All other components within normal limits  CBC WITH DIFFERENTIAL/PLATELET  LIPASE, BLOOD  CBC WITH DIFFERENTIAL/PLATELET   Patient's otherwise hemodynamically stable  Radiology: EXAM: DG ABDOMEN ACUTE W/ 1V CHEST  COMPARISON: 10/03/2014  FINDINGS: There is no evidence of dilated bowel loops or free intraperitoneal air. No radiopaque calculi or other significant radiographic abnormality is seen. Gastrostomy tube over the left upper quadrant.  Heart size and mediastinal contours are within normal limits. Both lungs are clear.  IMPRESSION: Negative abdominal radiographs. No acute cardiopulmonary disease.   Pending is a contrast study and a three-way of the abdomen to make sure the NG tube in the right position I personally reviewed the radiologic studies   Procedures:  G-tube replacement The patient's previous G-tube was checked and appeared to have a balloon down and was a 16 [place] tube. Patient had attempted trying to just put the 3116 [place] new G-tube in place but ran into obstruction. We tried a smaller G-tube at 3514 [place] but there seemed to bees some difficulty based on the patient getting more stiff and reacting which may be due to  her previous history. This made any tube difficult to pass the track seems to be still patent at the surface. Patient had some Ativan and Dilaudid for pain. Patient's area was again assessed with a hemostat we were able to establish the past 12 of the previous G-tube and successfully insert a 16 French tube. Patient appeared to tolerate procedure well and it easily flushes though we cannot pull back any gastric juice.  Patient will have placement of the tube checked by radiology evaluation The patient had her G-tube sutured in place for stability  ED Course:  Patient be observed here and apparently her daughter's left information take the patient home once the G-tube is checked and establish to be still adequately functioning.  Assessment:  G-tube placement   Final Clinical Impression:  * Final diagnoses:  Gastrostomy tube dependent (HCC)  G tube feedings [facility])     Plan:  Outpatient management           Jennye Moccasin, MD 04/11/15 [id number]

## 2015-04-11 NOTE — ED Notes (Signed)
Attempted to contact family by way of home number provided with a answer of "you have the wrong number." I provided my name and why I was calling. Receiving caller would not provide name.

## 2015-04-11 NOTE — ED Notes (Signed)
Indo to ED with G-Tube. Dr. Truitt MerleNotified.

## 2015-07-20 ENCOUNTER — Emergency Department
Admission: EM | Admit: 2015-07-20 | Discharge: 2015-07-21 | Disposition: A | Discharge status: Home / Self Care | Payer: Medicaid Other | Attending: Emergency Medicine | Admitting: Emergency Medicine

## 2015-07-20 DIAGNOSIS — Z431 Encounter for attention to gastrostomy: Secondary | ICD-10-CM

## 2015-07-20 DIAGNOSIS — Z79899 Other long term (current) drug therapy: Secondary | ICD-10-CM | POA: Insufficient documentation

## 2015-07-20 DIAGNOSIS — K9423 Gastrostomy malfunction: Secondary | ICD-10-CM | POA: Diagnosis present

## 2015-07-20 DIAGNOSIS — T85528A Displacement of other gastrointestinal prosthetic devices, implants and grafts, initial encounter: Secondary | ICD-10-CM

## 2015-07-20 DIAGNOSIS — G2582 Stiff-man syndrome: Secondary | ICD-10-CM | POA: Diagnosis not present

## 2015-07-20 MED ORDER — ONDANSETRON HCL 4 MG/2ML IJ SOLN
4.0000 mg | Freq: Once | INTRAMUSCULAR | Status: AC
  Administered 2015-07-20: 4 mg via INTRAVENOUS
  Filled 2015-07-20: qty 2

## 2015-07-20 MED ORDER — DIAZEPAM 5 MG/ML IJ SOLN
1.0000 mg | Freq: Once | INTRAMUSCULAR | Status: AC
  Administered 2015-07-20: 1 mg via INTRAVENOUS
  Filled 2015-07-20: qty 2

## 2015-07-20 MED ORDER — MORPHINE SULFATE (PF) 2 MG/ML IV SOLN
2.0000 mg | Freq: Once | INTRAVENOUS | Status: AC
  Administered 2015-07-20: 2 mg via INTRAVENOUS
  Filled 2015-07-20: qty 1

## 2015-07-20 NOTE — ED Provider Notes (Signed)
[facility] Provider Note  ____________________________________________  Time seen: Approximately 11:25 PM  I have reviewed the triage vital signs and the nursing notes.   HISTORY  Chief Complaint GI Problem    HPI Katherine Fields is a 50 y.o. female who presents to the ED from home via EMS for G-tube dislodgment. 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 Per EMS report and chart review, patient has a 80F feeding tube.Denies recent fever, chills, chest pain, shortness of breath, abdominal pain, vomiting, diarrhea. Denies recent travel or trauma. Nothing makes her symptoms better or worse.   Past Medical History  Diagnosis Date  . Stiffman syndrome   . G tube feedings (HCC)     There are no active problems to display for this patient.   No past surgical history on file.  Current Outpatient Rx  Name  Route  Sig  Dispense  Refill  . baclofen (LIORESAL) 10 MG tablet   Oral   Take 10 mg by mouth every 6 (six) hours.         . diazepam (VALIUM) 1 MG/ML solution   Per Tube   Place 2 mg into feeding tube every 8 (eight) hours as needed for anxiety.         . DULoxetine (CYMBALTA) 60 MG capsule   Oral   Take 60 mg by mouth daily.         . famotidine (PEPCID) 20 MG tablet   Oral   Take 20 mg by mouth 2 (two) times daily.         Marland Kitchen lacosamide (VIMPAT) 10 MG/ML oral solution   Oral   Take 150 mg by mouth 2 (two) times daily.         Marland Kitchen levETIRAcetam (KEPPRA) 100 MG/ML solution   Oral   Take 750 mg by mouth 2 (two) times daily.         Marland Kitchen lisinopril (PRINIVIL,ZESTRIL) 10 MG tablet   Oral   Take 10 mg by mouth daily.         Marland Kitchen ZONISAMIDE PO   Per Tube   Place 20 mLs into feeding tube 2 (two) times daily. Zonisamide /ml give 20 mls by G tube twice daily           Allergies Review of patient's allergies indicates no known allergies.  No  family history on file.  Social History Social History  Substance Use Topics  . Smoking status: Never Smoker   . Smokeless tobacco: Not on file  . Alcohol Use: No    Review of Systems Constitutional: No fever/chills Eyes: No visual changes. ENT: No sore throat. Cardiovascular: Denies chest pain. Respiratory: Denies shortness of breath. Gastrointestinal: Positive for feeding tube dislodgment. No abdominal pain.  No nausea, no vomiting.  No diarrhea.  No constipation. Genitourinary: Negative for dysuria. Musculoskeletal: Negative for back pain. Skin: Negative for rash. Neurological: Negative for headaches, focal weakness or numbness.  10-point ROS otherwise negative.  ____________________________________________   PHYSICAL EXAM:  VITAL SIGNS: ED Triage Vitals  Enc Vitals Group     BP 07/20/15 2224 146/96 mmHg     Pulse Rate 07/20/15 2224 82     Resp 07/20/15 2224 18     Temp 07/20/15 2224 97.4 F (36.3 C)     Temp Source 07/20/15 2224 Oral     SpO2 07/20/15 2224 100 %     Weight 07/20/15 2224 120  lb (54.432 kg)     Height 07/20/15 2224  (1.626 m)     Head Cir --      Peak Flow --      Pain Score --      Pain Loc --      Pain Edu? --      Excl. in GC? --     Constitutional: Alert and oriented. Chronically ill appearing and in no acute distress. Eyes: Conjunctivae are normal. PERRL. EOMI. Head: Atraumatic. Nose: No congestion/rhinnorhea. Mouth/Throat: Mucous membranes are moist.  Oropharynx non-erythematous. Neck: No stridor.   Cardiovascular: Normal rate, regular rhythm. Grossly normal heart sounds.  Good peripheral circulation. Respiratory: Normal respiratory effort.  No retractions. Lungs CTAB. Gastrointestinal: Soft and nontender. LUQ stoma. No distention. No abdominal bruits. No CVA tenderness. Musculoskeletal: Stiff extremities 4. No lower extremity tenderness nor edema.  No joint effusions. Neurologic:  Normal speech and language. No gross focal  neurologic deficits are appreciated.  Skin:  Skin is warm, dry and intact. No rash noted. Psychiatric: Mood and affect are normal. Speech and behavior are normal.  ____________________________________________   LABS (all labs ordered are listed, but only abnormal results are displayed)  Labs Reviewed - No data to display ____________________________________________  EKG  None ____________________________________________  RADIOLOGY  KUB (viewed by me, interpreted per Dr. Gwenyth Bender): Percutaneous gastrostomy with tip in the stomach. ____________________________________________   PROCEDURES  Procedure(s) performed:   Gastrostomy tube replacement Performed by: Irean Hong Consent: Verbal consent obtained. Risks and benefits: risks, benefits and alternatives were discussed Required items: required blood products, implants, devices, and special equipment available Patient identity confirmed: hospital-assigned identification number Time out: Immediately prior to procedure a "time out" was called to verify the correct patient, procedure, equipment, support staff and site/side marked as required. Preparation: Patient was prepped and draped in the usual sterile fashion. IV Valium and morphine was given ahead of procedure for patient's comfort.  Patient tolerance: Patient tolerated the procedure well with no immediate complications.  Comments: 16 french Gastrostomy tube placed without difficulty  Critical Care performed: No  ____________________________________________   INITIAL IMPRESSION / ASSESSMENT AND PLAN / ED COURSE  Pertinent labs & imaging results that were available during my care of the patient were reviewed by me and considered in my medical decision making (see chart for details).  50 year old female with a history of Stiffman syndrome who presents with feeding tube dislodgment. Prior chart review from November 2016 reveals that patient required analgesia and  benzodiazepine to replace feeding tube. Will administer IV medications and attempt G-tube replacement.  ----------------------------------------- 12:57 AM on 07/21/2015 -----------------------------------------  G-tube in good position, verified by x-ray. Updated patient and given strict return precautions. Patient verbalizes understanding and agrees with plan of care. ____________________________________________   FINAL CLINICAL IMPRESSION(S) / ED DIAGNOSES  Final diagnoses:  Dislodged gastrostomy tube (HCC)      Irean Hong, MD 07/21/15 [id number]

## 2015-07-20 NOTE — ED Notes (Addendum)
Per EMS, patient comes from home. Patients feeding tube was dislodged this evening and family is requesting to have it fixed. Feeding tube was in the LUQ and was a 23F. Due to EMS feeding tube was placed "in case patient wasn't able to eat any more". Patient is able to take food, fluid, and medications PO. When patient was asked why it was placed she states, "Lord I have no idea". Patient is agitated upon assessment. EMS states she is A&O x4. Patient will not respond to orientation questions at this time. Patient is yelling, "no".

## 2015-07-21 ENCOUNTER — Emergency Department: Payer: Medicaid Other

## 2015-07-21 MED ORDER — IOTHALAMATE MEGLUMINE 17.2 % UR SOLN: 60.0000 mL | Freq: Once | URETHRAL | Status: DC | PRN

## 2015-07-21 NOTE — Discharge Instructions (Signed)
1. You may resume use of your G-tube as usual. 2. Return to the ER for worsening symptoms, persistent vomiting, difficult to breathing or other concerns.  Gastrostomy Tube Replacement, Care After Refer to this sheet in the next few weeks. These instructions provide you with information on caring for yourself after your procedure. Your health care provider may also give you more specific instructions. Your treatment has been planned according to current medical practices, but problems sometimes occur. Call your health care provider if you have any problems or questions after your procedure. WHAT TO EXPECT AFTER THE PROCEDURE  After your procedure, it is typical to have the following:   Mild abdominal pain.  A small amount of blood-tinged fluid leaking from the replacement site. HOME CARE INSTRUCTIONS  You may resume your normal level of activity.  You may resume your normal feedings.  Care for your gastrostomy tube as you did before, or as directed by your health care provider. SEEK MEDICAL CARE IF:  You have a fever or chills.  You have redness or irritation near the insertion site.  You continue to have abdominal pain or leaking around your gastrostomy tube. SEEK IMMEDIATE MEDICAL CARE IF:   You develop bleeding or significant discharge around the tube.  You have severe abdominal pain.  Your new tube does not seem to be working properly.  You are unable to get feedings into the tube.  Your tube comes out for any reason.    This information is not intended to replace advice given to you by your health care provider. Make sure you discuss any questions you have with your health care provider.   Document Released: 12/14/2013 Document Reviewed: 12/14/2013 Elsevier Interactive Patient Education [employer]

## 2015-07-21 NOTE — ED Notes (Signed)
Family called to come and pick patient up after d/c.

## 2016-03-14 ENCOUNTER — Encounter: Payer: Self-pay | Admitting: Emergency Medicine

## 2016-03-14 ENCOUNTER — Emergency Department: Payer: Medicaid Other

## 2016-03-14 ENCOUNTER — Emergency Department
Admission: EM | Admit: 2016-03-14 | Discharge: 2016-03-14 | Disposition: A | Discharge status: Home / Self Care | Payer: Medicaid Other | Attending: Internal Medicine | Admitting: Internal Medicine

## 2016-03-14 DIAGNOSIS — G2582 Stiff-man syndrome: Secondary | ICD-10-CM | POA: Insufficient documentation

## 2016-03-14 DIAGNOSIS — R569 Unspecified convulsions: Secondary | ICD-10-CM

## 2016-03-14 DIAGNOSIS — R4182 Altered mental status, unspecified: Secondary | ICD-10-CM | POA: Insufficient documentation

## 2016-03-14 HISTORY — DX: Nontraumatic intracerebral hemorrhage, unspecified: I61.9

## 2016-03-14 HISTORY — DX: Unspecified protein-calorie malnutrition: E46

## 2016-03-14 HISTORY — DX: Other chronic pain: G89.29

## 2016-03-14 LAB — CBC WITH DIFFERENTIAL/PLATELET
Basophils Absolute: 0 10*3/uL (ref 0–0.1)
Basophils Relative: 1 %
EOS ABS: 0 10*3/uL (ref 0–0.7)
EOS PCT: 0 %
HCT: 40.2 % (ref 35.0–47.0)
Hemoglobin: 13.6 g/dL (ref 12.0–16.0)
LYMPHS ABS: 1.5 10*3/uL (ref 1.0–3.6)
Lymphocytes Relative: 39 %
MCH: 29.9 pg (ref 26.0–34.0)
MCHC: 33.9 g/dL (ref 32.0–36.0)
MCV: 88.1 fL (ref 80.0–100.0)
MONO ABS: 0.3 10*3/uL (ref 0.2–0.9)
Monocytes Relative: 7 %
Neutro Abs: 2.1 10*3/uL (ref 1.4–6.5)
Neutrophils Relative %: 53 %
PLATELETS: 245 10*3/uL (ref 150–440)
RBC: 4.56 MIL/uL (ref 3.80–5.20)
RDW: 16 % — AB (ref 11.5–14.5)
WBC: 3.9 10*3/uL (ref 3.6–11.0)

## 2016-03-14 LAB — COMPREHENSIVE METABOLIC PANEL
ALT: 17 U/L (ref 14–54)
ANION GAP: 10 (ref 5–15)
AST: 30 U/L (ref 15–41)
Albumin: 4 g/dL (ref 3.5–5.0)
Alkaline Phosphatase: 70 U/L (ref 38–126)
BUN: 18 mg/dL (ref 6–20)
CHLORIDE: 104 mmol/L (ref 101–111)
CO2: 28 mmol/L (ref 22–32)
CREATININE: 0.79 mg/dL (ref 0.44–1.00)
Calcium: 10 mg/dL (ref 8.9–10.3)
Glucose, Bld: 96 mg/dL (ref 65–99)
POTASSIUM: 3.2 mmol/L — AB (ref 3.5–5.1)
SODIUM: 142 mmol/L (ref 135–145)
Total Bilirubin: 0.7 mg/dL (ref 0.3–1.2)
Total Protein: 8.3 g/dL — ABNORMAL HIGH (ref 6.5–8.1)

## 2016-03-14 LAB — URINALYSIS COMPLETE WITH MICROSCOPIC (ARMC ONLY)
BILIRUBIN URINE: NEGATIVE
Bacteria, UA: NONE SEEN
GLUCOSE, UA: NEGATIVE mg/dL
HGB URINE DIPSTICK: NEGATIVE
LEUKOCYTES UA: NEGATIVE
NITRITE: NEGATIVE
Protein, ur: 30 mg/dL — AB
SPECIFIC GRAVITY, URINE: 1.023 (ref 1.005–1.030)
pH: 5 (ref 5.0–8.0)

## 2016-03-14 LAB — TROPONIN I

## 2016-03-14 LAB — CK: CK TOTAL: 140 U/L (ref 38–234)

## 2016-03-14 MED ORDER — ALBUTEROL SULFATE (2.5 MG/3ML) 0.083% IN NEBU: 2.5000 mg | INHALATION_SOLUTION | RESPIRATORY_TRACT | Status: DC | PRN

## 2016-03-14 MED ORDER — ONDANSETRON HCL 4 MG/2ML IJ SOLN: 4.0000 mg | Freq: Four times a day (QID) | INTRAMUSCULAR | Status: DC | PRN

## 2016-03-14 MED ORDER — SODIUM CHLORIDE 0.9 % IV SOLN: 200.0000 mg | Freq: Once | INTRAVENOUS | Status: DC

## 2016-03-14 MED ORDER — ONDANSETRON HCL 4 MG PO TABS: 4.0000 mg | ORAL_TABLET | Freq: Four times a day (QID) | ORAL | Status: DC | PRN

## 2016-03-14 MED ORDER — ACETAMINOPHEN 325 MG PO TABS: 650.0000 mg | ORAL_TABLET | Freq: Four times a day (QID) | ORAL | Status: DC | PRN

## 2016-03-14 MED ORDER — LEVETIRACETAM 100 MG/ML PO SOLN: 750.0000 mg | Freq: Two times a day (BID) | ORAL | Status: DC

## 2016-03-14 MED ORDER — LACOSAMIDE 10 MG/ML PO SOLN: 200.0000 mg | Freq: Two times a day (BID) | ORAL | Status: DC

## 2016-03-14 MED ORDER — POTASSIUM CHLORIDE IN NACL 20-0.9 MEQ/L-% IV SOLN: INTRAVENOUS | Status: DC

## 2016-03-14 MED ORDER — LEVETIRACETAM 500 MG/5ML IV SOLN: 1000.0000 mg | Freq: Two times a day (BID) | INTRAVENOUS | Status: DC

## 2016-03-14 MED ORDER — POLYETHYLENE GLYCOL 3350 17 G PO PACK: 17.0000 g | PACK | Freq: Every day | ORAL | Status: DC | PRN

## 2016-03-14 MED ORDER — BISACODYL 10 MG RE SUPP: 10.0000 mg | Freq: Every day | RECTAL | Status: DC | PRN

## 2016-03-14 MED ORDER — ENOXAPARIN SODIUM 40 MG/0.4ML ~~LOC~~ SOLN: 40.0000 mg | SUBCUTANEOUS | Status: DC

## 2016-03-14 MED ORDER — ACETAMINOPHEN 650 MG RE SUPP: 650.0000 mg | Freq: Four times a day (QID) | RECTAL | Status: DC | PRN

## 2016-03-14 MED ORDER — SODIUM CHLORIDE 0.9 % IV SOLN
1000.0000 mL | Freq: Once | INTRAVENOUS | Status: AC
  Administered 2016-03-14: 1000 mL via INTRAVENOUS

## 2016-03-14 MED ORDER — POTASSIUM CHLORIDE 20 MEQ/15ML (10%) PO SOLN: 40.0000 meq | Freq: Once | ORAL | Status: DC

## 2016-03-14 NOTE — ED Notes (Signed)
Called patients daughter, Tamera PuntMiranda, and informed her patient has not been picked up yet. Per Tamera PuntMiranda she is on her way back up here.

## 2016-03-14 NOTE — ED Notes (Signed)
Spoke with patients daughter, Tamera PuntMiranda, Per Tamera PuntMiranda, her sister is coming to pick patient up.

## 2016-03-14 NOTE — ED Provider Notes (Signed)
-----------------------------------------   4:00 PM on 03/14/2016 -----------------------------------------   Blood pressure (!) 155/109, pulse (!) 111, resp. rate 18, height 5' (1.524 m), weight 93 lb (42.2 kg), SpO2 99 %.  Assuming care from Dr. Cyril LoosenKinner.  In short, Katherine Fields is a 50 y.o. female with a chief complaint of Fall .  Refer to the original H&P for additional details.  The current plan of care is to discharge patient home. 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 Family has arrived and states that she is currently at her normal mental status and the patient was seen by the hospitalist team (please see their consult note), and the patient was felt to be stable for discharge. Findings cited on her CAT scan apparently were old and the family has reassured us that they aren't confident that this is how she normally responds. She did not seem to suffer any significant injury from her fall. Have resources at home available    Jennye MoccasinBrian S Tacy Chavis, MD 03/14/16 1601

## 2016-03-14 NOTE — ED Triage Notes (Signed)
[facility], per EMS patient reports that she fell 2 days ago and has been on the floor since then. EMS reports that patient has hx/o seizure and stroke, patient has had seizure since being in floor per her report.

## 2016-03-14 NOTE — Progress Notes (Signed)
LCSW received a call from APS worker Frances FurbishRandi Owenby [phone] who called as she had some safety concerns for this patient it was explained that the patient would be d/c home as per 3 separate doctors have engaged. APS will send a weekend officer from APS to do an in home inspection.   LCSW conquered this was a great idea. Information was exchanged to contact each other over weekend if necessary.   Delta Air LinesClaudine Amahri Dengel LCSW [phone]

## 2016-03-14 NOTE — Discharge Instructions (Signed)
Please return immediately if condition worsens. Please contact her primary physician or the physician you were given for referral. If you have any specialist physicians involved in her treatment and plan please also contact them. Thank you for using [facility]. ° °

## 2016-03-14 NOTE — ED Provider Notes (Signed)
[facility] Emergency Department Provider Note   ____________________________________________    I have reviewed the triage vital signs and the nursing notes.   HISTORY  Chief Complaint Fall History significantly limited  HPI Katherine Fields is a 50 y.o. female who is unable to provide significant history herself. EMS reports she was found down next to her bed and reports she has a history of epilepsy and stiffman syndrome. It is unclear how long she was down   Past Medical History:  Diagnosis Date  . G tube feedings (HCC)   . Stiffman syndrome     There are no active problems to display for this patient.   No past surgical history on file.  Prior to Admission medications   Medication Sig Start Date End Date Taking? Authorizing Provider  baclofen (LIORESAL) 10 MG tablet Take 10 mg by mouth every 6 (six) hours.    Historical Provider, MD  diazepam (VALIUM) 1 MG/ML solution Place 2 mg into feeding tube every 8 (eight) hours as needed for anxiety.    Historical Provider, MD  DULoxetine (CYMBALTA) 60 MG capsule Take 60 mg by mouth daily.    Historical Provider, MD  famotidine (PEPCID) 20 MG tablet Take 20 mg by mouth 2 (two) times daily.    Historical Provider, MD  lacosamide (VIMPAT) 10 MG/ML oral solution Take 150 mg by mouth 2 (two) times daily.    Historical Provider, MD  levETIRAcetam (KEPPRA) 100 MG/ML solution Take 750 mg by mouth 2 (two) times daily.    Historical Provider, MD  lisinopril (PRINIVIL,ZESTRIL) 10 MG tablet Take 10 mg by mouth daily.    Historical Provider, MD  ZONISAMIDE PO Place 20 mLs into feeding tube 2 (two) times daily. Zonisamide 10mg /ml give 20 mls by G tube twice daily    Historical Provider, MD     Allergies Tramadol  No family history on file.  Social History Social History  Substance Use Topics  . Smoking status: Never Smoker  . Smokeless tobacco: Never Used  . Alcohol use No    L5 caveat: Unable to  obtain Review of Systems due to altered mental status    ____________________________________________   PHYSICAL EXAM:  VITAL SIGNS: ED Triage Vitals  Enc Vitals Group     BP 03/14/16 1356 (!) 155/109     Pulse Rate 03/14/16 1356 (!) 111     Resp 03/14/16 1356 18     Temp --      Temp src --      SpO2 03/14/16 1336 100 %     Weight 03/14/16 1357 93 lb (42.2 kg)     Height 03/14/16 1357 5' (1.524 m)     Head Circumference --      Peak Flow --      Pain Score --      Pain Loc --      Pain Edu? --      Excl. in GC? --     Constitutional: Alert, Cachectic  Eyes: Conjunctivae are normal. PERRLA  Nose: No congestion/rhinnorhea. Mouth/Throat: Mucous membranes are moist.    Cardiovascular: Normal rate, regular rhythm. Grossly normal heart sounds.  Good peripheral circulation. Respiratory: Normal respiratory effort.  No retractions. Lungs CTAB. Gastrointestinal: Soft and nontender. No distention.  No CVA tenderness. G-tube noted Genitourinary: deferred Musculoskeletal:Warm and well perfused. Significant muscle wasting. Mild tenderness to palpation over the left lateral bony pelvis Neurologic:  Nonverbal. Does answer yes or no questions by nodding or shaking  head appears to move all extremities although limited on the right.  Skin:  Skin is warm, dry and intact. No rash noted.   ____________________________________________   LABS (all labs ordered are listed, but only abnormal results are displayed)  Labs Reviewed  CBC WITH DIFFERENTIAL/PLATELET - Abnormal; Notable for the following:       Result Value   RDW 16.0 (*)    All other components within normal limits  COMPREHENSIVE METABOLIC PANEL - Abnormal; Notable for the following:    Potassium 3.2 (*)    Total Protein 8.3 (*)    All other components within normal limits  CK  TROPONIN I  URINALYSIS COMPLETEWITH MICROSCOPIC ([facility] ONLY)   ____________________________________________  EKG  ED ECG REPORT I, Jene EveryKINNER,  Derika Eckles, the attending physician, personally viewed and interpreted this ECG.  Date: 03/14/2016 EKG Time: 1:45 PM Rate: 96 Rhythm: normal sinus rhythm QRS Axis: normal Intervals: normal ST/T Wave abnormalities: normal Conduction Disturbances: none Narrative Interpretation: unremarkable  ____________________________________________  RADIOLOGY  CT head shows new area of encephalomalacia Pelvis x-ray unremarkable ____________________________________________   PROCEDURES  Procedure(s) performed: No    Critical Care performed:No ____________________________________________   INITIAL IMPRESSION / ASSESSMENT AND PLAN / ED COURSE  Pertinent labs & imaging results that were available during my care of the patient were reviewed by me and considered in my medical decision making (see chart for details).  Patient presents with altered mental status, review of records seems to demonstrate that she is able to speak at her baseline however she is not able to do so currently. Her lab work is reassuring however her CT head shows a new area of encephalomalacia and given changes in her speech feel further evaluation is necessary.  Clinical Course  Patient did have brief period where her heart rate increased and jerked her upper extremities several times however this quickly resolves without intervention and she immediately was answering questions again after this ____________________________________________   FINAL CLINICAL IMPRESSION(S) / ED DIAGNOSES  Final diagnoses:  Altered mental status, unspecified altered mental status type      NEW MEDICATIONS STARTED DURING THIS VISIT:  New Prescriptions   No medications on file     Note:  This document was prepared using Dragon voice recognition software and may include unintentional dictation errors.    Jene Everyobert Diallo Ponder, MD 03/14/16 [phone]

## 2016-03-14 NOTE — ED Notes (Signed)
Marylene LandAngela, RN, Jae DireKate, RN, and Tammy, EMT-P at bedside, patient observed having seizure like activity with eye deviated to the right and increased heart rate. Patient able to open eyes and follow nurses commands to follow finger with her eyes.   Dr. Cyril LoosenKinner notified and came to bedside. Verbal order given for Ativan 1 mg.  When RN came back to room with Ativan, seizure like activity had resolved and heart rate was returning to baseline, per Dr. Cyril LoosenKinner hold Ativan 1 mg but keep at bedside in case activity occurs again.

## 2016-03-14 NOTE — ED Notes (Addendum)
Patient daughter and sister at beside. RN inquired about when the last time patient was seen by family. Per patient daughter, patient stayed the night at her house last night. When patient is there she sleeps on a mattress on the floor to prevent her from falling. This morning patient daughter left to go to work and her sister was at the house with the patient.  Per patient daughter, she was at work and received a call stating that police had found patient in the floor and she was soaking wet. Informed patient daughter that we were informed patient had not had her medication in about 2 days and had not eaten in 2 days. Per patients daughter patient has not missed any doses of her medication and has been eating regularly. Patient daughter states that patient is currently at her baseline.  RN informed patients daughter that patient was not wet when she was brought in and when UA was collected from patient she her brief was still dry.

## 2016-03-14 NOTE — Consult Note (Signed)
[facility] [place] [facility]   PATIENT NAME: Katherine Fields    MR#:  [id number]  DATE OF BIRTH:  12-27-1965  DATE OF ADMISSION:  03/14/2016  PRIMARY CARE PHYSICIAN: No primary care provider on file.   CONSULT REQUESTING/REFERRING PHYSICIAN: Dr. Cyril LoosenKinner  REASON FOR CONSULT: Confusion  CHIEF COMPLAINT:   Chief Complaint  Patient presents with  . Fall    HISTORY OF PRESENT ILLNESS:  Katherine Fields  is a 50 y.o. 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 Patient was brought in for confusion laying in bed for 2 days. Initial concern was if patient had a new stroke or recurrent seizures. It is unclear who called EMS. Here CT scan of the head showed left frontal encephalomalacia. No signs of infection. Normal CK level. Normal CBC and BMP.  Initially decision was made to admit the patient to the medical floor. No family was available. Later patient's daughter arrived and mentions that she saw the patient earlier today and is at baseline. Patient is confused at baseline due to dementia.  Took her medications in the morning and is doing well.  PAST MEDICAL HISTORY:   Past Medical History:  Diagnosis Date  . Chronic pain   . G tube feedings (HCC)   . ICH (intracerebral hemorrhage) (HCC)   . Protein calorie malnutrition (HCC)   . Stiffman syndrome     PAST SURGICAL HISTOIRY:  No past surgical history on file.  SOCIAL HISTORY:   Social History  Substance Use Topics  . Smoking status: Never Smoker  . Smokeless tobacco: Never Used  . Alcohol use No    FAMILY HISTORY:   Family History  Problem Relation Age of Onset  . Seizures Father     DRUG ALLERGIES:   Allergies  Allergen Reactions  . Tramadol     Other reaction(s): Unknown    REVIEW OF SYSTEMS:   Review of Systems  Unable to perform ROS:  Dementia      MEDICATIONS AT HOME:   Prior to Admission medications   Medication Sig Start Date End Date Taking? Authorizing Provider  baclofen (LIORESAL) 10 MG tablet Take 10 mg by mouth every 6 (six) hours.    Historical Provider, MD  diazepam (VALIUM) 1 MG/ML solution Place 2 mg into feeding tube every 8 (eight) hours as needed for anxiety.    Historical Provider, MD  DULoxetine (CYMBALTA) 60 MG capsule Take 60 mg by mouth daily.    Historical Provider, MD  famotidine (PEPCID) 20 MG tablet Take 20 mg by mouth 2 (two) times daily.    Historical Provider, MD  lacosamide (VIMPAT) 10 MG/ML oral solution Take 150 mg by mouth 2 (two) times daily.    Historical Provider, MD  levETIRAcetam (KEPPRA) 100 MG/ML solution Take 750 mg by mouth 2 (two) times daily.    Historical Provider, MD  lisinopril (PRINIVIL,ZESTRIL) 10 MG tablet Take 10 mg by mouth daily.    Historical Provider, MD  ZONISAMIDE PO Place 20 mLs into feeding tube 2 (two) times daily. Zonisamide 10mg /ml give 20 mls by G tube twice daily    Historical Provider, MD      VITAL SIGNS:  Blood pressure (!) 155/109, pulse (!) 111, resp. rate 18, height 5' (1.524 m), weight 42.2 kg (93 lb), SpO2 99 %.  PHYSICAL EXAMINATION:  GENERAL:  50 y.o.-year-old patient lying in the  bed with no acute distress.  EYES: Pupils equal, round, reactive to light and accommodation. No scleral icterus. Extraocular muscles intact.  HEENT: Head atraumatic, normocephalic. Oropharynx and nasopharynx clear.  NECK:  Supple, no jugular venous distention. No thyroid enlargement, no tenderness.  LUNGS: Normal breath sounds bilaterally, no wheezing, rales,rhonchi or crepitation. No use of accessory muscles of respiration.  CARDIOVASCULAR: S1, S2 normal. No murmurs, rubs, or gallops.  ABDOMEN: Soft, nontender, nondistended. Bowel sounds present. No organomegaly or mass.  EXTREMITIES: No pedal edema, cyanosis, or clubbing.  NEUROLOGIC: moves all 4 extremities.   PSYCHIATRIC: The patient is alert and awake. confused  LABORATORY PANEL:   CBC  Recent Labs Lab 03/14/16 1346  WBC 3.9  HGB 13.6  HCT 40.2  PLT 245   ------------------------------------------------------------------------------------------------------------------  Chemistries   Recent Labs Lab 03/14/16 1346  NA 142  K 3.2*  CL 104  CO2 28  GLUCOSE 96  BUN 18  CREATININE 0.79  CALCIUM 10.0  AST 30  ALT 17  ALKPHOS 70  BILITOT 0.7   ------------------------------------------------------------------------------------------------------------------  Cardiac Enzymes  Recent Labs Lab 03/14/16 1346  TROPONINI <0.03   ------------------------------------------------------------------------------------------------------------------  RADIOLOGY:  Dg Pelvis 1-2 Views  Result Date: 03/14/2016 CLINICAL DATA:  Initial encounter for Patient brought in by [facility] from home, per EMS patient reports that she fell 2 days ago and has been on the floor since then. EMS reports that patient has hx/o seizure and stroke, patient has had seizure since being in floor per .*comment was truncated* EXAM: PELVIS - 1-2 VIEW COMPARISON:  04/11/2015 FINDINGS: Suboptimal positioning, presumably secondary to patient clinical status. Given this factor, no fracture or dislocation identified. Sacroiliac joints are symmetric. IMPRESSION: No acute osseous abnormality. Electronically Signed   By: Jeronimo Greaves M.D.   On: 03/14/2016 14:26   Ct Head Wo Contrast  Result Date: 03/14/2016 CLINICAL DATA:  Pain following fall.  History of seizures EXAM: CT HEAD WITHOUT CONTRAST TECHNIQUE: Contiguous axial images were obtained from the base of the skull through the vertex without intravenous contrast. COMPARISON:  Oct 31, 2011 and September 21, 2011 FINDINGS: Brain: There is mild diffuse atrophy with ventricles and sulci are more prominent than on prior study. There is uniformly decreased attenuation in the mid left  frontal lobe at the level of the mid upper lateral ventricles, consistent with encephalomalacia from previous hemorrhagic lesion noted in this area on prior studies from 2013. The previously noted vasogenic edema has resolved. No new lesion is identified in this region. There is slight enlargement of the frontal horn of the left lateral ventricle in this area due to ex vacuo phenomenon. There is no mass effect. There is no acute hemorrhage. There is no extra-axial fluid or midline shift. No acute infarct is evident. There is a slight degree of small vessel disease in right anterior centrum semiovale. Vascular: There is no hyperdense vessel. There is calcification in the each carotid siphon, more on the right than on the left. Skull: The bony calvarium appears intact. Sinuses/Orbits: Visualized paranasal sinuses are clear. Visualized orbits appear symmetric bilaterally. Other: Mastoid air cells are clear. IMPRESSION: Compared to prior study, there is now mild generalized atrophy. 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 There is no demonstrable acute hemorrhage, mass effect, edema, or acute appearing infarct. There is slight small vessel disease in the anterior right centrum semiovale. There is carotid siphon calcification, more on the right than on the left. Electronically Signed   By: Bretta Bang  III M.D.   On: 03/14/2016 14:16    EKG:   Orders placed or performed during the hospital encounter of 03/14/16  . ED EKG  . ED EKG  . EKG 12-Lead  . EKG 12-Lead  . EKG 12-Lead  . EKG 12-Lead    IMPRESSION AND PLAN:   * Confusion Patient seems to be at baseline as per daughter at bedside. No change in medications.  * Seizures Well-controlled on Keppra, Vimpat and zoisamide.  Discussed with Dr. Huel Cote of emergency room. Admitting orders discontinued.  Patient is being discharged home by emergency room, as patient is at baseline.  All the records are reviewed and case  discussed with Consulting provider. Management plans discussed with the patient, family and they are in agreement.  TOTAL TIME TAKING CARE OF THIS PATIENT: 45 minutes.   Milagros Loll R M.D on 03/14/2016 at 3:45 PM  Between 7am to 6pm - Pager - [phone]  After 6pm go to [email or web address] - password [email or web address] [facility]  [facility]  Office  [phone]  HQ:[email or web address] care Physician: No primary care provider on file.  Note: This dictation was prepared with Dragon dictation along with smaller phrase technology. Any transcriptional errors that result from this process are unintentional.

## 2016-09-24 ENCOUNTER — Emergency Department: Payer: Medicaid Other

## 2016-09-24 ENCOUNTER — Encounter: Payer: Self-pay | Admitting: Emergency Medicine

## 2016-09-24 ENCOUNTER — Inpatient Hospital Stay
Admission: EM | Admit: 2016-09-24 | Discharge: 2016-09-30 | DRG: 101 | Disposition: A | Discharge status: Skilled Nursing Facility | Payer: Medicaid Other | Attending: Internal Medicine | Admitting: Internal Medicine

## 2016-09-24 DIAGNOSIS — G2582 Stiff-man syndrome: Secondary | ICD-10-CM | POA: Diagnosis present

## 2016-09-24 DIAGNOSIS — Z931 Gastrostomy status: Secondary | ICD-10-CM

## 2016-09-24 DIAGNOSIS — E440 Moderate protein-calorie malnutrition: Secondary | ICD-10-CM | POA: Diagnosis present

## 2016-09-24 DIAGNOSIS — G894 Chronic pain syndrome: Secondary | ICD-10-CM | POA: Diagnosis present

## 2016-09-24 DIAGNOSIS — R1310 Dysphagia, unspecified: Secondary | ICD-10-CM | POA: Diagnosis not present

## 2016-09-24 DIAGNOSIS — R569 Unspecified convulsions: Secondary | ICD-10-CM | POA: Diagnosis present

## 2016-09-24 DIAGNOSIS — Z79899 Other long term (current) drug therapy: Secondary | ICD-10-CM

## 2016-09-24 DIAGNOSIS — G40309 Generalized idiopathic epilepsy and epileptic syndromes, not intractable, without status epilepticus: Secondary | ICD-10-CM

## 2016-09-24 DIAGNOSIS — G40409 Other generalized epilepsy and epileptic syndromes, not intractable, without status epilepticus: Secondary | ICD-10-CM | POA: Diagnosis present

## 2016-09-24 DIAGNOSIS — G4089 Other seizures: Secondary | ICD-10-CM

## 2016-09-24 DIAGNOSIS — I10 Essential (primary) hypertension: Secondary | ICD-10-CM | POA: Diagnosis present

## 2016-09-24 DIAGNOSIS — I69359 Hemiplegia and hemiparesis following cerebral infarction affecting unspecified side: Secondary | ICD-10-CM | POA: Diagnosis not present

## 2016-09-24 DIAGNOSIS — Z681 Body mass index (BMI) 19 or less, adult: Secondary | ICD-10-CM

## 2016-09-24 DIAGNOSIS — E876 Hypokalemia: Secondary | ICD-10-CM | POA: Diagnosis present

## 2016-09-24 DIAGNOSIS — Z885 Allergy status to narcotic agent status: Secondary | ICD-10-CM | POA: Diagnosis not present

## 2016-09-24 DIAGNOSIS — R1313 Dysphagia, pharyngeal phase: Secondary | ICD-10-CM | POA: Diagnosis present

## 2016-09-24 DIAGNOSIS — Z993 Dependence on wheelchair: Secondary | ICD-10-CM

## 2016-09-24 DIAGNOSIS — Z8679 Personal history of other diseases of the circulatory system: Secondary | ICD-10-CM | POA: Diagnosis not present

## 2016-09-24 LAB — COMPREHENSIVE METABOLIC PANEL
ALT: 16 U/L (ref 14–54)
ANION GAP: 9 (ref 5–15)
AST: 30 U/L (ref 15–41)
Albumin: 4.3 g/dL (ref 3.5–5.0)
Alkaline Phosphatase: 119 U/L (ref 38–126)
BUN: 10 mg/dL (ref 6–20)
CHLORIDE: 103 mmol/L (ref 101–111)
CO2: 29 mmol/L (ref 22–32)
Calcium: 9.8 mg/dL (ref 8.9–10.3)
Creatinine, Ser: 0.81 mg/dL (ref 0.44–1.00)
Glucose, Bld: 81 mg/dL (ref 65–99)
Potassium: 3.4 mmol/L — ABNORMAL LOW (ref 3.5–5.1)
SODIUM: 141 mmol/L (ref 135–145)
Total Bilirubin: 0.5 mg/dL (ref 0.3–1.2)
Total Protein: 8.7 g/dL — ABNORMAL HIGH (ref 6.5–8.1)

## 2016-09-24 LAB — CBC WITH DIFFERENTIAL/PLATELET
Basophils Absolute: 0 10*3/uL (ref 0–0.1)
Basophils Relative: 0 %
EOS ABS: 0 10*3/uL (ref 0–0.7)
Eosinophils Relative: 0 %
HEMATOCRIT: 42.1 % (ref 35.0–47.0)
HEMOGLOBIN: 14.2 g/dL (ref 12.0–16.0)
LYMPHS ABS: 0.4 10*3/uL — AB (ref 1.0–3.6)
LYMPHS PCT: 5 %
MCH: 28.2 pg (ref 26.0–34.0)
MCHC: 33.6 g/dL (ref 32.0–36.0)
MCV: 83.8 fL (ref 80.0–100.0)
MONOS PCT: 5 %
Monocytes Absolute: 0.4 10*3/uL (ref 0.2–0.9)
NEUTROS ABS: 7.5 10*3/uL — AB (ref 1.4–6.5)
NEUTROS PCT: 90 %
Platelets: 251 10*3/uL (ref 150–440)
RBC: 5.02 MIL/uL (ref 3.80–5.20)
RDW: 17.9 % — ABNORMAL HIGH (ref 11.5–14.5)
WBC: 8.3 10*3/uL (ref 3.6–11.0)

## 2016-09-24 LAB — MRSA PCR SCREENING: MRSA BY PCR: NEGATIVE

## 2016-09-24 MED ORDER — DIAZEPAM 1 MG/ML PO SOLN: 2.0000 mg | Freq: Three times a day (TID) | ORAL | Status: DC | PRN

## 2016-09-24 MED ORDER — ONDANSETRON HCL 4 MG PO TABS: 4.0000 mg | ORAL_TABLET | Freq: Four times a day (QID) | ORAL | Status: DC | PRN

## 2016-09-24 MED ORDER — DULOXETINE HCL 30 MG PO CPEP
60.0000 mg | ORAL_CAPSULE | Freq: Every day | ORAL | Status: DC
  Administered 2016-09-25 – 2016-09-30 (×6): 60 mg via ORAL
  Filled 2016-09-24 (×5): qty 2
  Filled 2016-09-24: qty 1

## 2016-09-24 MED ORDER — ENOXAPARIN SODIUM 40 MG/0.4ML ~~LOC~~ SOLN
40.0000 mg | SUBCUTANEOUS | Status: DC
  Administered 2016-09-24 – 2016-09-29 (×6): 40 mg via SUBCUTANEOUS
  Filled 2016-09-24 (×6): qty 0.4

## 2016-09-24 MED ORDER — SODIUM CHLORIDE 0.9 % IV SOLN
1000.0000 mg | Freq: Once | INTRAVENOUS | Status: AC
  Administered 2016-09-24: 1000 mg via INTRAVENOUS
  Filled 2016-09-24: qty 10

## 2016-09-24 MED ORDER — ACETAMINOPHEN 325 MG PO TABS: 650.0000 mg | ORAL_TABLET | Freq: Four times a day (QID) | ORAL | Status: DC | PRN

## 2016-09-24 MED ORDER — LORAZEPAM 2 MG/ML PO CONC
2.0000 mg | Freq: Three times a day (TID) | ORAL | Status: DC | PRN
  Administered 2016-09-26: 06:00:00 2 mg via ORAL
  Filled 2016-09-24: qty 1

## 2016-09-24 MED ORDER — ZONISAMIDE 100 MG PO CAPS
100.0000 mg | ORAL_CAPSULE | Freq: Two times a day (BID) | ORAL | Status: DC
  Administered 2016-09-24 – 2016-09-30 (×12): 100 mg via ORAL
  Filled 2016-09-24 (×12): qty 1

## 2016-09-24 MED ORDER — LEVETIRACETAM 100 MG/ML PO SOLN
750.0000 mg | Freq: Two times a day (BID) | ORAL | Status: DC
  Administered 2016-09-24 – 2016-09-25 (×2): 750 mg
  Filled 2016-09-24 (×4): qty 7.5

## 2016-09-24 MED ORDER — LACOSAMIDE 50 MG PO TABS
200.0000 mg | ORAL_TABLET | Freq: Two times a day (BID) | ORAL | Status: DC
  Administered 2016-09-24 – 2016-09-30 (×12): 200 mg via ORAL
  Filled 2016-09-24 (×10): qty 4
  Filled 2016-09-24: qty 1
  Filled 2016-09-24 (×2): qty 4

## 2016-09-24 MED ORDER — LACOSAMIDE 10 MG/ML PO SOLN: 200.0000 mg | Freq: Two times a day (BID) | ORAL | Status: DC

## 2016-09-24 MED ORDER — LIDOCAINE HCL 2 % EX GEL
CUTANEOUS | Status: AC
  Administered 2016-09-24: 1
  Filled 2016-09-24: qty 10

## 2016-09-24 MED ORDER — LORAZEPAM 2 MG/ML IJ SOLN: 1.0000 mg | INTRAMUSCULAR | Status: DC | PRN

## 2016-09-24 MED ORDER — LEVETIRACETAM 100 MG/ML PO SOLN
750.0000 mg | Freq: Two times a day (BID) | ORAL | Status: DC
  Filled 2016-09-24 (×2): qty 7.5

## 2016-09-24 MED ORDER — SENNOSIDES-DOCUSATE SODIUM 8.6-50 MG PO TABS: 1.0000 | ORAL_TABLET | Freq: Every evening | ORAL | Status: DC | PRN

## 2016-09-24 MED ORDER — ONDANSETRON HCL 4 MG/2ML IJ SOLN: 4.0000 mg | Freq: Four times a day (QID) | INTRAMUSCULAR | Status: DC | PRN

## 2016-09-24 MED ORDER — SODIUM CHLORIDE 0.9 % IV SOLN
INTRAVENOUS | Status: DC
  Administered 2016-09-24 – 2016-09-26 (×4): via INTRAVENOUS

## 2016-09-24 MED ORDER — LORAZEPAM 2 MG/ML IJ SOLN
INTRAMUSCULAR | Status: AC
  Filled 2016-09-24: qty 1

## 2016-09-24 MED ORDER — BACLOFEN 10 MG PO TABS
5.0000 mg | ORAL_TABLET | Freq: Four times a day (QID) | ORAL | Status: DC
  Administered 2016-09-24 – 2016-09-25 (×3): 5 mg
  Filled 2016-09-24 (×5): qty 1

## 2016-09-24 MED ORDER — HYDROCODONE-ACETAMINOPHEN 5-325 MG PO TABS: 1.0000 | ORAL_TABLET | ORAL | Status: DC | PRN

## 2016-09-24 MED ORDER — ACETAMINOPHEN 650 MG RE SUPP: 650.0000 mg | Freq: Four times a day (QID) | RECTAL | Status: DC | PRN

## 2016-09-24 MED ORDER — HYDROCODONE-ACETAMINOPHEN 5-325 MG PO TABS
1.0000 | ORAL_TABLET | ORAL | Status: DC | PRN
  Administered 2016-09-24: 1
  Filled 2016-09-24: qty 1

## 2016-09-24 MED ORDER — IOPAMIDOL (ISOVUE-300) INJECTION 61%
10.0000 mL | Freq: Once | INTRAVENOUS | Status: AC | PRN
  Administered 2016-09-24: 10 mL

## 2016-09-24 MED ORDER — LIDOCAINE HCL 2 % EX GEL
1.0000 "application " | Freq: Once | CUTANEOUS | Status: AC
  Administered 2016-09-24: 1

## 2016-09-24 NOTE — ED Triage Notes (Signed)
Pt to ED via EMS from home c/o seizures x2 today.  Per EMS patient has hx of seizures and is taking medications, patient pulled out g-tube during seizure.  Pt postictal for EMS, answers yes/no with appropriate nods.  Pt presents alert, chest rise even and unlabored, able to follow commands.  EMS vitals 188/120 BP, 126 HR, 98% RA.

## 2016-09-24 NOTE — ED Notes (Signed)
This RN was walking by room, saw patient having a seizure. White foam was noted at her mouth. This RN was turned on her right side and yelled for Dr Darnelle Catalan. EDP came to bedside and patient stopped seizing.

## 2016-09-24 NOTE — ED Notes (Signed)
Witnessed seizure by RN, note placed for observation.  See neuro assessment.

## 2016-09-24 NOTE — ED Notes (Signed)
MD and family at bedside discussing patient care.

## 2016-09-24 NOTE — H&P (Signed)
[facility] [place] [facility]   PATIENT NAME: Katherine Fields    MR#:  [id number]  DATE OF BIRTH:  [birth year]  DATE OF ADMISSION:  09/24/2016  PRIMARY CARE PHYSICIAN: No PCP Per Patient   REQUESTING/REFERRING PHYSICIAN: dr Darnelle Catalan  CHIEF COMPLAINT:    seziures HISTORY OF PRESENT ILLNESS:  Katherine Fields  is a 51 y.o. female with a known history of Stiffman syndrome and seizure disorder who presented to ED due to seizure andshe pulled out her NG tube during her seziure. She is currently on multiple medications for seizures. Apparently she has seizures fairly frequently. While emergency room she had 2 witnessed tonic-clonic seizures. She is now loaded with Keppra. Neurology has been consulted and I have discussed the case with the neurologist on-call. Patient had her G-tube repositioned by interventional radiology on the emergency room. Patient is nonverbal at baseline and I am unable to obtain history of present illness or ROS.  PAST MEDICAL HISTORY:   Past Medical History:  Diagnosis Date  . Chronic pain   . G tube feedings (HCC)   . ICH (intracerebral hemorrhage) (HCC)   . Protein calorie malnutrition (HCC)   . Stiffman syndrome   Seizures  PAST SURGICAL HISTORY:  History reviewed. No pertinent surgical history.  SOCIAL HISTORY:   Social History  Substance Use Topics  . Smoking status: Never Smoker  . Smokeless tobacco: Never Used  . Alcohol use No    FAMILY HISTORY:   Family History  Problem Relation Age of Onset  . Seizures Father     DRUG ALLERGIES:   Allergies  Allergen Reactions  . Tramadol     Other reaction(s): Unknown    REVIEW OF SYSTEMS:   Review of Systems  Unable to perform ROS: Acuity of condition    MEDICATIONS AT HOME:   Prior to Admission medications   Medication Sig Start Date End Date Taking? Authorizing Provider  baclofen (LIORESAL) 10 MG tablet Place 5 mg into feeding tube every 6 (six) hours.    Yes  Historical Provider, MD  diazepam (VALIUM) 1 MG/ML solution Place 2 mg into feeding tube every 8 (eight) hours as needed for anxiety.   Yes Historical Provider, MD  DULoxetine (CYMBALTA) 30 MG capsule Take 60 mg by mouth daily.   Yes Historical Provider, MD  lacosamide (VIMPAT) 10 MG/ML oral solution Take 200 mg by mouth 2 (two) times daily.    Yes Historical Provider, MD  levETIRAcetam (KEPPRA) 100 MG/ML solution Take 750 mg by mouth 2 (two) times daily.   Yes Historical Provider, MD  ZONISAMIDE PO Place 20 mLs into feeding tube 2 (two) times daily. Zonisamide /ml give 20 mls by G tube twice daily   Yes Historical Provider, MD      VITAL SIGNS:  Blood pressure (!) 188/99, pulse (!) 106, temperature 97.7 F (36.5 C), temperature source Oral, resp. rate (!) 27, height  (1.676 m), weight 45.4 kg (100 lb), SpO2 90 %.  PHYSICAL EXAMINATION:   Physical Exam  Constitutional: She is oriented to person, place, and time. No distress.  Chronically ill-appearing  HENT:  Head: Normocephalic.  Eyes: No scleral icterus.  Neck: Neck supple. No JVD present. No tracheal deviation present.  Cardiovascular: Normal rate, regular rhythm and normal heart sounds.  Exam reveals no gallop and no friction rub.   No murmur [facility]: Effort normal and breath sounds normal. No respiratory distress. She has no wheezes. She has no rales. She exhibits no  tenderness.  Abdominal: Soft. Bowel sounds are normal. She exhibits no distension and no mass. There is no tenderness. There is no rebound and no guarding.  G tube in place  Musculoskeletal: She exhibits tenderness. She exhibits no edema or deformity.  Lymphadenopathy:    She has no cervical adenopathy.  Neurological: She is alert and oriented to person, place, and time.  Patient opens eyes but does not say anything She has bit her inner lip and is postictal  Skin: Skin is warm. No rash noted. No erythema.      LABORATORY PANEL:    CBC  Recent Labs Lab 09/24/16 1216  WBC 8.3  HGB 14.2  HCT 42.1  PLT 251   ------------------------------------------------------------------------------------------------------------------  Chemistries   Recent Labs Lab 09/24/16 1216  NA 141  K 3.4*  CL 103  CO2 29  GLUCOSE 81  BUN 10  CREATININE 0.81  CALCIUM 9.8  AST 30  ALT 16  ALKPHOS 119  BILITOT 0.5   ------------------------------------------------------------------------------------------------------------------  Cardiac Enzymes No results for input(s): TROPONINI in the last 168 hours. ------------------------------------------------------------------------------------------------------------------  RADIOLOGY:  Dg Chest Portable 1 View  Result Date: 09/24/2016 CLINICAL DATA:  Seizure today. EXAM: PORTABLE CHEST 1 VIEW COMPARISON:  Acute abdominal series 04/11/2015 FINDINGS: The heart size is normal. Superior segment right lower lobe airspace disease is concerning for pneumonia or aspiration. The left lung is clear. There is no edema or effusion. The visualized soft tissues and bony thorax are unremarkable. IMPRESSION: 1. Right-sided airspace disease appears to be in the superior segment right lower lobe representing either right lower lobe pneumonia or aspiration. Electronically Signed   By: Marin Roberts M.D.   On: 09/24/2016 18:07   Dg Perc Gastrostomy Tube Insert W/fluoro  Result Date: 09/24/2016 INDICATION: Intracranial hemorrhage, dysphagia, chronic gastrostomy use, feeding tube accidentally removed EXAM: FLUOROSCOPIC REPLACEMENT OF THE FEEDING TUBE MEDICATIONS: None. ANESTHESIA/SEDATION: None. The patient was continuously monitored during the procedure by the interventional radiology nurse under my direct supervision. CONTRAST:  10 cc - administered into the gastric lumen. FLUOROSCOPY TIME:  Fluoroscopy Time:  12 seconds (1.2 mGy). COMPLICATIONS: None immediate. PROCEDURE: Informed written consent  was obtained from the patient's family after a thorough discussion of the procedural risks, benefits and alternatives. All questions were addressed. Maximal Sterile Barrier Technique was utilized including caps, mask, sterile gowns, sterile gloves, sterile drape, hand hygiene and skin antiseptic. A timeout was performed prior to the initiation of the procedure. Existing percutaneous gastrostomy site was cannulated with a catheter without difficulty. Amplatz guidewire inserted. Tract dilatation performed to advance 16 French balloon retention gastrostomy. Balloon inflated with 6 cc saline and retracted against anterior gastric wall. Contrast injection confirms position in the stomach. Images obtained for documentation. Feeding tube secured externally. Access ready for use. IMPRESSION: Successful fluoroscopic replacement of a 16 French balloon retention gastrostomy. Electronically Signed   By: Judie Petit.  Shick M.D.   On: 09/24/2016 16:36    EKG:   Sinus tachycardia no ST elevation or depression  IMPRESSION AND PLAN:   51 year old female who is nonverbal at baseline with a history of stiffman syndrome and seizures on multiple medications for seizures who has had 2 seizures within 1 hour in the emergency room.  1. Seizures: Patient was loaded with Keppra. I discussed case with neurologist on call who is recommending to continue outpatient medications. I will check urine analysis to make sure patient is have a UTI causing seizures. Continue seizure precautions   2.Stiffman syndrome with PEG tube now  repositioned  3. Hypokalemia: replete    All the records are reviewed and case discussed with ED provider.  CODE STATUS: FULL  CC TOTAL TIME TAKING CARE OF THIS PATIENT: 45 minutes.  High risk of aspiration/seziures   Camille Thau M.D on 09/24/2016 at 6:17 PM  Between 7am to 6pm - Pager - [phone]  After 6pm go to [email or web address] - Social research officer, government  [facility]  Office   [phone]  CC: Primary care physician; No PCP Per Patient

## 2016-09-24 NOTE — ED Notes (Signed)
Pt back from X-ray.  

## 2016-09-24 NOTE — ED Notes (Signed)
Family called for update. Per family please call when ready for discharge.

## 2016-09-24 NOTE — ED Notes (Signed)
MD requesting family at bedside to verify baseline of patient. Family called and stated they would be on their way.

## 2016-09-24 NOTE — ED Notes (Signed)
Dr. Darnelle Catalan attempted to replace g-tube without success.  MD to consult GI.

## 2016-09-24 NOTE — Progress Notes (Signed)
Name: Katherine Fields MRN: [id number] DOB: Nov 19, 1965    ADMISSION DATE:  09/24/2016  CHIEF COMPLAINT:  Seizures  Referring physician; Dr. Juliene Pina for seizures.   BRIEF PATIENT DESCRIPTION: 51 year old female with known History of Stiffman syndrome and seizure disorder, presenting with seizures and apparently pulling out her peg tube during her seizure event.  SIGNIFICANT EVENTS  4/25  Patient admitted to the ICU S/P  Seizures.  STUDIES:  None   HISTORY OF PRESENT ILLNESS:  Katherine Fields is a 51 year old female with known history of Stiffman syndrome, Chronic Pain,ICH, Chronic pain  And G-tube feedings.  Patient was presented to ED on 4/25 with seizures and during seizures she pulled her G-tube out.Apparently her seizures seems fairly frequently.  She was loaded up with keppra .  Neurologist was consulted for further recommendation.  G- Tube was replaced at Interventional radiology.  Patient was sent to the ICU for closer monitoring of seizures.Marland Kitchen  PAST MEDICAL HISTORY :   has a past medical history of Chronic pain; G tube feedings (HCC); ICH (intracerebral hemorrhage) (HCC); Protein calorie malnutrition (HCC); and Stiffman syndrome.  has no past surgical history on file. Prior to Admission medications   Medication Sig Start Date End Date Taking? Authorizing Provider  baclofen (LIORESAL) 10 MG tablet Place 5 mg into feeding tube every 6 (six) hours.    Yes Historical Provider, MD  diazepam (VALIUM) 1 MG/ML solution Place 2 mg into feeding tube every 8 (eight) hours as needed for anxiety.   Yes Historical Provider, MD  DULoxetine (CYMBALTA) 30 MG capsule Take 60 mg by mouth daily.   Yes Historical Provider, MD  lacosamide (VIMPAT) 10 MG/ML oral solution Take 200 mg by mouth 2 (two) times daily.    Yes Historical Provider, MD  levETIRAcetam (KEPPRA) 100 MG/ML solution Take 750 mg by mouth 2 (two) times daily.   Yes Historical Provider, MD  ZONISAMIDE PO Place 20 mLs into feeding tube 2  (two) times daily. Zonisamide /ml give 20 mls by G tube twice daily   Yes Historical Provider, MD   Allergies  Allergen Reactions  . Tramadol     Other reaction(s): Unknown    FAMILY HISTORY:  family history includes Seizures in her father. SOCIAL HISTORY:  reports that she has never smoked. She has never used smokeless tobacco. She reports that she does not drink alcohol or use drugs.  REVIEW OF SYSTEMS:   Constitutional: Negative for fever, chills, weight loss, malaise/fatigue and diaphoresis.  HENT: Negative for hearing loss, ear pain, nosebleeds, congestion, sore throat, neck pain, tinnitus and ear discharge.   Eyes: Negative for blurred vision, double vision, photophobia, pain, discharge and redness.  Respiratory: Negative for cough, hemoptysis, sputum production, shortness of breath, wheezing and stridor.   Cardiovascular: Negative for chest pain, palpitations, orthopnea, claudication, leg swelling and PND.  Gastrointestinal: Negative for heartburn, nausea, vomiting, abdominal pain, diarrhea, constipation, blood in stool and melena.  Genitourinary: Negative for dysuria, urgency, frequency, hematuria and flank pain.  Musculoskeletal: Negative for myalgias, back pain, joint pain and falls.  Skin: Negative for itching and rash.  Neurological: Negative for dizziness, tingling, tremors, sensory change, speech change, focal weakness, seizures, loss of consciousness, weakness and headaches.  Endo/Heme/Allergies: Negative for environmental allergies and polydipsia. Does not bruise/bleed easily.  SUBJECTIVE:  Patient is non verbal at the baseline  VITAL SIGNS: Temp:  [97.7 F (36.5 C)] 97.7 F (36.5 C) (04/25 1140) Pulse Rate:  [96-112] 105 (04/25 1930) Resp:  [  14-27] 14 (04/25 1930) BP: (146-188)/(92-108) 168/103 (04/25 1930) SpO2:  [90 %-100 %] 100 % (04/25 1930) Weight:  [100 lb (45.4 kg)] 100 lb (45.4 kg) (04/25 1141)  PHYSICAL EXAMINATION: General:  51 year old female, on  RA, in no acute distress Neuro:  Non verbal HEENT: AT,[place],opens eyes spontaneously Cardiovascular:  S1S2,regular, no m/r/g Lungs:  Clear bilaterally , no wheezes, crackles, rhonchi noted Abdomen:  Soft, NT,ND, G-tube in place Musculoskeletal: No edema/cyanosis noted Skin:  Warm ,dry and intact   Recent Labs Lab 09/24/16 1216  NA 141  K 3.4*  CL 103  CO2 29  BUN 10  CREATININE 0.81  GLUCOSE 81    Recent Labs Lab 09/24/16 1216  HGB 14.2  HCT 42.1  WBC 8.3  PLT 251   Dg Chest Portable 1 View  Result Date: 09/24/2016 CLINICAL DATA:  Seizure today. EXAM: PORTABLE CHEST 1 VIEW COMPARISON:  Acute abdominal series 04/11/2015 FINDINGS: The heart size is normal. Superior segment right lower lobe airspace disease is concerning for pneumonia or aspiration. The left lung is clear. There is no edema or effusion. The visualized soft tissues and bony thorax are unremarkable. IMPRESSION: 1. Right-sided airspace disease appears to be in the superior segment right lower lobe representing either right lower lobe pneumonia or aspiration. Electronically Signed   By: Marin Roberts M.D.   On: 09/24/2016 18:07   Dg Perc Gastrostomy Tube Insert W/fluoro  Result Date: 09/24/2016 INDICATION: Intracranial hemorrhage, dysphagia, chronic gastrostomy use, feeding tube accidentally removed EXAM: FLUOROSCOPIC REPLACEMENT OF THE FEEDING TUBE MEDICATIONS: None. ANESTHESIA/SEDATION: None. The patient was continuously monitored during the procedure by the interventional radiology nurse under my direct supervision. CONTRAST:  10 cc - administered into the gastric lumen. FLUOROSCOPY TIME:  Fluoroscopy Time:  12 seconds (1.2 mGy). COMPLICATIONS: None immediate. PROCEDURE: Informed written consent was obtained from the patient's family after a thorough discussion of the procedural risks, benefits and alternatives. All questions were addressed. Maximal Sterile Barrier Technique was utilized including caps, mask,  sterile gowns, sterile gloves, sterile drape, hand hygiene and skin antiseptic. A timeout was performed prior to the initiation of the procedure. Existing percutaneous gastrostomy site was cannulated with a catheter without difficulty. Amplatz guidewire inserted. Tract dilatation performed to advance 16 French balloon retention gastrostomy. Balloon inflated with 6 cc saline and retracted against anterior gastric wall. Contrast injection confirms position in the stomach. Images obtained for documentation. Feeding tube secured externally. Access ready for use. IMPRESSION: Successful fluoroscopic replacement of a 16 French balloon retention gastrostomy. Electronically Signed   By: Judie Petit.  Shick M.D.   On: 09/24/2016 16:36    ASSESSMENT / PLAN:  Seizures Hx of Stiffman Syndrome/chronic pain Accidental dislodgement of peg tube in the event of seizures  Plan Patient was loaded with Keppra Continue home dose keppa and Vimpat Urine negative for any infection causing seizures Peg tube replaced by IR PRN lorazepam for breakthrough seizures.   Bincy Varughese,AG-ACNP Pulmonary and Critical Care Medicine [facility]   09/24/2016, 9:38 PM   Patient was seen and examined with NP, above note as amended reflects my findings, assessment, plan. The patient is a 51 year old female with a history of neurodegenerative disorder, Stiff Person Syndrome (follows with Dr. 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 She is wheelchair dependent and uses G-tube feeds for nutrition. She is immobile, minimally verbal, and is fully dependent on others for ADLs and all care.  Regards to  her seizures, she follows with a neurologist at [facility] (Dr. Sherlean Foot) , she is maintained on Vimpat 200 mg twice daily, Keppra 750 mg 2 times daily, Zonisamide 200 daily, baclofent 5 mg qid.   The patient has seizures frequently. Apparently, she  had 2 tonic-clonic seizures witnessed in the emergency room which was the reason she was brought there, during the seizure. She had her G-tube pulled out, this was repositioned by interventional radiology. However, unfortunately this was once again were removed by the patient. The case was discussed with neurology. Overnight, the patient was given a Loading dose, she has had no further seizures since that time. Case was discussed with the hospitalist, the patient does take a dysphagia diet, therefore, we will try to start this, as well as obtain a speech therapy consult to see if we can avoid having to replace the feeding tube once again. I personally reviewed the patient's chest x-ray imaging, this shows a questionable area of infiltrate in the right middle lobe, however, given the lack of symptoms. This does not appear to be representative of a pneumonia at this time, this could represent an aspiration pneumonitis, we'll continue to monitor the patient's respiratory status.   Wells Guiles, M.D.  09/25/2016

## 2016-09-24 NOTE — ED Notes (Signed)
Hooked pt up to monitor and gave pt a blanket.

## 2016-09-24 NOTE — Procedures (Signed)
s/p fluoro replacement of the 19fr Gtube  Confirmed in the stomach with contrast  Ready for use Full report in pacs

## 2016-09-24 NOTE — ED Provider Notes (Addendum)
[facility] Emergency Department Provider Note   ____________________________________________   First MD Initiated Contact with Patient 09/24/16 1138     (approximate)  I have reviewed the triage vital signs and the nursing notes.   HISTORY  Chief Complaint Seizures and g-tube replacement History per EMS unable to obtain history from patient because of her rare neurological disorders stiff person syndrome.  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 Uncertain of when the NG tube was pulled out. Patient is reported by the nursing home to be at her baseline. She apparently has seizures fairly frequently. He did not come in with the G-tube but old records indicate it was a 22 [place]. 16 French G-tube was obtained and attempted to replace the G-tube but the muscle of the abdominal wall have some spasm type. And patient is yelling in pain. I got a Urojet and applied some lidocaine jelly to the G-tube and an attempted to inject the Urojet material into the G-tube however was unable to do so. I waited approximate 5 minutes repeatedly attempting he tried to use the Urojet tapered injector to slightly dilate the whole but again was unsuccessful. 5 more minutes well a lapse in a tried again patient still having her abdominal wall type she did not appear to be in any way near as much pain anymore. Still unable to insert a G-tube. Surgery asked me to do and call GI. GI has no coverage today. Intending to call surgery again. Surgery wants me to call interventional radiology intervention radiology agrees to put in the G-tube. I should add that the nurse discussed patient with family. Seizures are not uncommon for her. She does not appear to be doing any different than usual. So we will replace the G-tube and send her back so she can get her doses of antiseizure medicine. We later talked to a different  family member who states that she hasn't had a seizure since 2017 when she was at [facility] and controlled on medicines. This changes that look considerably as she had another tonic-clonic seizure in the ER that lasted just about a minute.  Past Medical History:  Diagnosis Date  . Chronic pain   . G tube feedings (HCC)   . ICH (intracerebral hemorrhage) (HCC)   . Protein calorie malnutrition (HCC)   . Stiffman syndrome     Patient Active Problem List   Diagnosis Date Noted  . Seizure (HCC) 03/14/2016    History reviewed. No pertinent surgical history.  Prior to Admission medications   Medication Sig Start Date End Date Taking? Authorizing Provider  baclofen (LIORESAL) 10 MG tablet Take 10 mg by mouth every 6 (six) hours.    Historical Provider, MD  diazepam (VALIUM) 1 MG/ML solution Place 2 mg into feeding tube every 8 (eight) hours as needed for anxiety.    Historical Provider, MD  DULoxetine (CYMBALTA) 60 MG capsule Take 60 mg by mouth daily.    Historical Provider, MD  famotidine (PEPCID) 20 MG tablet Take 20 mg by mouth 2 (two) times daily.    Historical Provider, MD  lacosamide (VIMPAT) 10 MG/ML oral solution Take 150 mg by mouth 2 (two) times daily.    Historical Provider, MD  levETIRAcetam (KEPPRA) 100 MG/ML solution Take 750 mg by mouth 2 (two) times daily.    Historical Provider, MD  lisinopril (PRINIVIL,ZESTRIL) 10 MG tablet Take 10 mg by mouth  daily.    Historical Provider, MD  ZONISAMIDE PO Place 20 mLs into feeding tube 2 (two) times daily. Zonisamide /ml give 20 mls by G tube twice daily    Historical Provider, MD    Allergies Tramadol  Family History  Problem Relation Age of Onset  . Seizures Father     Social History Social History  Substance Use Topics  . Smoking status: Never Smoker  . Smokeless tobacco: Never Used  . Alcohol use No    Review of Systems  Unable to obtain   ____________________________________________   PHYSICAL EXAM:  VITAL  SIGNS: ED Triage Vitals  Enc Vitals Group     BP 09/24/16 1140 (!) 158/108     Pulse Rate 09/24/16 1140 (!) 112     Resp 09/24/16 1140 16     Temp 09/24/16 1140 97.7 F (36.5 C)     Temp Source 09/24/16 1140 Oral     SpO2 09/24/16 1140 95 %     Weight 09/24/16 1141 100 lb (45.4 kg)     Height 09/24/16 1141  (1.676 m)     Head Circumference --      Peak Flow --      Pain Score --      Pain Loc --      Pain Edu? --      Excl. in GC? --     Constitutional: Alert Follows some commandsll appearing and in no acute distress. Eyes: Conjunctivae are normal. PERRL. EOMI. Head: Atraumatic. Nose: No congestion/rhinnorhea. Mouth/Throat: Mucous membranes are moist.  Oropharynx non-erythematous. extremely poor dentition  Neck: No stridor.  Cardiovascular: Normal rate, regular rhythm. Grossly normal heart sounds.  Good peripheral circulation. Respiratory: Normal respiratory effort.  No retractions. Lungs CTAB. Gastrointestinal: Soft and nontender. No distention. No abdominal bruits. No CVA tenderness.Hole from G-tube resident is not leaking Musculoskeletal: No lower extremity tenderness nor edema.   ____________________________________________   LABS (all labs ordered are listed, but only abnormal results are displayed)  Labs Reviewed  COMPREHENSIVE METABOLIC PANEL - Abnormal; Notable for the following:       Result Value   Potassium 3.4 (*)    Total Protein 8.7 (*)    All other components within normal limits  CBC WITH DIFFERENTIAL/PLATELET - Abnormal; Notable for the following:    RDW 17.9 (*)    Neutro Abs 7.5 (*)    Lymphs Abs 0.4 (*)    All other components within normal limits   ____________________________________________  EKG  EKG shows sinus tachycardia rate of 122 with a normal axis no acute ST-T wave changes. Patient's heart rate generally goes up if we are attempting to place the G-tube and if she is resting comfortably is less than 100 normal sinus rhythm on the  monitor. ____________________________________________  RADIOLOGY   ____________________________________________   PROCEDURES  Procedure(s) performed:   Procedures  Critical Care performed:  Critical care time 45 minutes this includes checking her after the first seizure talking to neurology attempting to put the G-tube and etc. ____________________________________________   INITIAL IMPRESSION / ASSESSMENT AND PLAN / ED COURSE  Pertinent labs & imaging results that were available during my care of the patient were reviewed by me and considered in my medical decision making (see chart for details).   No seizures in the ER. See below     ____________________________________________   FINAL CLINICAL IMPRESSION(S) / ED DIAGNOSES  Final diagnoses:  Feeding by G-tube (HCC)  Seizure (HCC)   G-tube replaced  NEW MEDICATIONS STARTED DURING THIS VISIT:  New Prescriptions   No medications on file     Note:  This document was prepared using Dragon voice recognition software and may include unintentional dictation errors.    Arnaldo Natal, MD 09/24/16 1637    Arnaldo Natal, MD 09/24/16 1638    Arnaldo Natal, MD 09/24/16 1639    Arnaldo Natal, MD 09/24/16 1650 Patient had 1 seizure with some foaming at the mouth nurse saw her in toTurn on her side lungs were clear afterwards she then woke up and appeared to come close to her baseline but then had another tonic-clonic seizure.we will give her Ativan and then loaded her with Keppra and admit her.   Arnaldo Natal, MD 09/24/16 1747    Arnaldo Natal, MD 09/24/16 [phone]

## 2016-09-24 NOTE — ED Notes (Signed)
Pt desats to low 80s while sleeping. Pt placed on 2L nasal cannula for preventive measures.

## 2016-09-25 DIAGNOSIS — R569 Unspecified convulsions: Secondary | ICD-10-CM

## 2016-09-25 DIAGNOSIS — G40309 Generalized idiopathic epilepsy and epileptic syndromes, not intractable, without status epilepticus: Secondary | ICD-10-CM

## 2016-09-25 DIAGNOSIS — R1310 Dysphagia, unspecified: Secondary | ICD-10-CM

## 2016-09-25 LAB — BASIC METABOLIC PANEL
ANION GAP: 7 (ref 5–15)
BUN: 13 mg/dL (ref 6–20)
CO2: 30 mmol/L (ref 22–32)
Calcium: 9.7 mg/dL (ref 8.9–10.3)
Chloride: 104 mmol/L (ref 101–111)
Creatinine, Ser: 0.97 mg/dL (ref 0.44–1.00)
GFR calc Af Amer: >60 mL/min (ref >60)
GLUCOSE: 68 mg/dL (ref 65–99)
POTASSIUM: 4.3 mmol/L (ref 3.5–5.1)
Sodium: 141 mmol/L (ref 135–145)

## 2016-09-25 LAB — LEVETIRACETAM LEVEL: LEVETIRACETAM: NOT DETECTED ug/mL (ref 10.0–40.0)

## 2016-09-25 LAB — GLUCOSE, CAPILLARY: Glucose-Capillary: 85 mg/dL (ref 65–99)

## 2016-09-25 MED ORDER — LEVETIRACETAM 100 MG/ML PO SOLN
1000.0000 mg | Freq: Two times a day (BID) | ORAL | Status: DC
  Administered 2016-09-25 – 2016-09-30 (×10): 1000 mg via ORAL
  Filled 2016-09-25 (×11): qty 10

## 2016-09-25 MED ORDER — HYDROCODONE-ACETAMINOPHEN 5-325 MG PO TABS
1.0000 | ORAL_TABLET | ORAL | Status: DC | PRN
  Administered 2016-09-25: 1 via ORAL
  Administered 2016-09-28: 20:00:00 2 via ORAL
  Administered 2016-09-28: 15:00:00 1 via ORAL
  Administered 2016-09-30: 2 via ORAL
  Filled 2016-09-25 (×2): qty 2
  Filled 2016-09-25 (×2): qty 1

## 2016-09-25 MED ORDER — BACLOFEN 10 MG PO TABS
5.0000 mg | ORAL_TABLET | Freq: Four times a day (QID) | ORAL | Status: DC
  Administered 2016-09-25 – 2016-09-30 (×21): 5 mg via ORAL
  Filled 2016-09-25 (×9): qty 1
  Filled 2016-09-25: qty 0.5
  Filled 2016-09-25 (×10): qty 1

## 2016-09-25 NOTE — Progress Notes (Signed)
[facility]   PATIENT NAME: Katherine Fields    MR#:  [id number]  DATE OF BIRTH:  1965-12-12  SUBJECTIVE:  CHIEF COMPLAINT:   Chief Complaint  Patient presents with  . Seizures  . g-tube replacement   More awake and less confused. Pulled PEG out  Follows commands  REVIEW OF SYSTEMS:    Review of Systems  Unable to perform ROS: Mental acuity    DRUG ALLERGIES:   Allergies  Allergen Reactions  . Tramadol     Other reaction(s): Unknown    VITALS:  Blood pressure (!) 146/84, pulse 84, temperature 98.6 F (37 C), temperature source Oral, resp. rate 19, height  (1.676 m), weight 45.4 kg (100 lb), SpO2 94 %.  PHYSICAL EXAMINATION:   Physical Exam  GENERAL:  51 y.o.-year-old patient lying in the bed with no acute distress.  EYES: Pupils equal, round, reactive to light and accommodation. No scleral icterus. Extraocular muscles intact.  HEENT: Head atraumatic, normocephalic. Oropharynx and nasopharynx clear.  NECK:  Supple, no jugular venous distention. No thyroid enlargement, no tenderness.  LUNGS: Normal breath sounds bilaterally, no wheezing, rales, rhonchi. No use of accessory muscles of respiration.  CARDIOVASCULAR: S1, S2 normal. No murmurs, rubs, or gallops.  ABDOMEN: Soft, non tender. PEG site has no bleeding EXTREMITIES: No cyanosis, clubbing or edema b/l.    NEUROLOGIC: Moves arms PSYCHIATRIC: The patient is alert and awake. Follows commands but non verbal  LABORATORY PANEL:   CBC  Recent Labs Lab 09/24/16 1216  WBC 8.3  HGB 14.2  HCT 42.1  PLT 251   ------------------------------------------------------------------------------------------------------------------ Chemistries   Recent Labs Lab 09/24/16 1216 09/25/16 0505  NA 141 141  K 3.4* 4.3  CL 103 104  CO2 29 30  GLUCOSE 81 68  BUN 10 13  CREATININE 0.81 0.97  CALCIUM 9.8 9.7  AST 30  --   ALT 16  --   ALKPHOS 119  --   BILITOT 0.5  --     ------------------------------------------------------------------------------------------------------------------  Cardiac Enzymes No results for input(s): TROPONINI in the last 168 hours. ------------------------------------------------------------------------------------------------------------------  RADIOLOGY:  Dg Chest Portable 1 View  Result Date: 09/24/2016 CLINICAL DATA:  Seizure today. EXAM: PORTABLE CHEST 1 VIEW COMPARISON:  Acute abdominal series 04/11/2015 FINDINGS: The heart size is normal. Superior segment right lower lobe airspace disease is concerning for pneumonia or aspiration. The left lung is clear. There is no edema or effusion. The visualized soft tissues and bony thorax are unremarkable. IMPRESSION: 1. Right-sided airspace disease appears to be in the superior segment right lower lobe representing either right lower lobe pneumonia or aspiration. Electronically Signed   By: Marin Roberts M.D.   On: 09/24/2016 18:07   Dg Perc Gastrostomy Tube Insert W/fluoro  Result Date: 09/24/2016 INDICATION: Intracranial hemorrhage, dysphagia, chronic gastrostomy use, feeding tube accidentally removed EXAM: FLUOROSCOPIC REPLACEMENT OF THE FEEDING TUBE MEDICATIONS: None. ANESTHESIA/SEDATION: None. The patient was continuously monitored during the procedure by the interventional radiology nurse under my direct supervision. CONTRAST:  10 cc - administered into the gastric lumen. FLUOROSCOPY TIME:  Fluoroscopy Time:  12 seconds (1.2 mGy). COMPLICATIONS: None immediate. PROCEDURE: Informed written consent was obtained from the patient's family after a thorough discussion of the procedural risks, benefits and alternatives. All questions were addressed. Maximal Sterile Barrier Technique was utilized including caps, mask, sterile gowns, sterile gloves, sterile drape, hand hygiene and skin antiseptic. A timeout was performed prior to the initiation of the procedure. Existing percutaneous  gastrostomy site was cannulated with a catheter without difficulty. Amplatz guidewire inserted. Tract dilatation performed to advance 16 French balloon retention gastrostomy. Balloon inflated with 6 cc saline and retracted against anterior gastric wall. Contrast injection confirms position in the stomach. Images obtained for documentation. Feeding tube secured externally. Access ready for use. IMPRESSION: Successful fluoroscopic replacement of a 16 French balloon retention gastrostomy. Electronically Signed   By: Judie Petit.  Shick M.D.   On: 09/24/2016 16:36     ASSESSMENT AND PLAN:   * Recurrent seizures This has been a chronic issue On Vimpat, Zonisamide, Keppra. Will request Neurology to see patient. Seizure precautions  * Dysphagia with PEG tube Patient pulled out her tube. Second instance She does tolerate mechanical soft diet and PEG is for supplements per PCP notes. Will get Swallow eval and calorie count. Hopefully can avoid PEG in the future.  * Post ictal state has resolved  * May need palliative evaluation  Discussed with Dr. Nicholos Johns in ICU.  Ordered speech, dietician and Neurology consults after discussing  All the records are reviewed and case discussed with Care Management/Social Worker Management plans discussed with the patient, family and they are in agreement.  CODE STATUS: FULL CODE  DVT Prophylaxis: SCDs  TOTAL TIME TAKING CARE OF THIS PATIENT: 30 minutes.   POSSIBLE D/C IN 1-2 DAYS, DEPENDING ON CLINICAL CONDITION.  Milagros Loll R M.D on 09/25/2016 at 8:31 AM  Between 7am to 6pm - Pager - [phone]  After 6pm go to [email or web address] - password [email or web address] [facility]  [facility]  Office  [phone]  CC: Primary care physician; No PCP Per Patient  Note: This dictation was prepared with Dragon dictation along with smaller phrase technology. Any transcriptional errors that result from this process are unintentional.

## 2016-09-25 NOTE — Consult Note (Signed)
Reason for Consult:seizures Referring Physician: Dr. Elpidio Anis  CC: seizures  HPI: Katherine Fields is an 51 y.o. female female with a known history of Stiffman syndrome and seizure disorder who presented to ED due to seizure andshe pulled out her NG tube during her seziure. Pt has frequent seizures and had 2 seizures prior to presentation.  At home pt is on Keppra, Vimpat and zonisemide.  Currently appears close to baseline.        Past Medical History:  Diagnosis Date  . Chronic pain   . G tube feedings (HCC)   . ICH (intracerebral hemorrhage) (HCC)   . Protein calorie malnutrition (HCC)   . Stiffman syndrome     History reviewed. No pertinent surgical history.  Family History  Problem Relation Age of Onset  . Seizures Father     Social History:  reports that she has never smoked. She has never used smokeless tobacco. She reports that she does not drink alcohol or use drugs.  Allergies  Allergen Reactions  . Tramadol     Other reaction(s): Unknown    Medications: I have reviewed the patient's current medications.  ROS: Unable to obtain as can't provide full history   Physical Examination: Blood pressure (!) 148/76, pulse 74, temperature 98.6 F (37 C), temperature source Oral, resp. rate 18, height  (1.676 m), weight 54.7 kg (120 lb 11.2 oz), SpO2 99 %.  Pt is awake, alert to her name only Dysarthric speech EOM intact No facial droop   Laboratory Studies:   Basic Metabolic Panel:  Recent Labs Lab 09/24/16 1216 09/25/16 0505  NA 141 141  K 3.4* 4.3  CL 103 104  CO2 29 30  GLUCOSE 81 68  BUN 10 13  CREATININE 0.81 0.97  CALCIUM 9.8 9.7    Liver Function Tests:  Recent Labs Lab 09/24/16 1216  AST 30  ALT 16  ALKPHOS 119  BILITOT 0.5  PROT 8.7*  ALBUMIN 4.3   No results for input(s): LIPASE, AMYLASE in the last 168 hours. No results for input(s): AMMONIA in the last 168 hours.  CBC:  Recent Labs Lab 09/24/16 1216  WBC 8.3   NEUTROABS 7.5*  HGB 14.2  HCT 42.1  MCV 83.8  PLT 251    Cardiac Enzymes: No results for input(s): CKTOTAL, CKMB, CKMBINDEX, TROPONINI in the last 168 hours.  BNP: Invalid input(s): POCBNP  CBG:  Recent Labs Lab 09/24/16 2025  GLUCAP 85    Microbiology: Results for orders placed or performed during the hospital encounter of 09/24/16  MRSA PCR Screening     Status: None   Collection Time: 09/24/16  8:37 PM  Result Value Ref Range Status   MRSA by PCR NEGATIVE NEGATIVE Final    Comment:        The GeneXpert MRSA Assay (FDA approved for NASAL specimens only), is one component of a comprehensive MRSA colonization surveillance program. It is not intended to diagnose MRSA infection nor to guide or monitor treatment for MRSA infections.     Coagulation Studies: No results for input(s): LABPROT, INR in the last 72 hours.  Urinalysis: No results for input(s): COLORURINE, LABSPEC, PHURINE, GLUCOSEU, HGBUR, BILIRUBINUR, KETONESUR, PROTEINUR, UROBILINOGEN, NITRITE, LEUKOCYTESUR in the last 168 hours.  Invalid input(s): APPERANCEUR  Lipid Panel:  No results found for: CHOL, TRIG, HDL, CHOLHDL, VLDL, LDLCALC  HgbA1C: No results found for: HGBA1C  Urine Drug Screen:  No results found for: LABOPIA, COCAINSCRNUR, LABBENZ, AMPHETMU, THCU, LABBARB  Alcohol Level: No results  for input(s): ETH in the last 168 hours.  Other results: EKG: normal EKG, normal sinus rhythm, unchanged from previous tracings.  Imaging: Dg Chest Portable 1 View  Result Date: 09/24/2016 CLINICAL DATA:  Seizure today. EXAM: PORTABLE CHEST 1 VIEW COMPARISON:  Acute abdominal series 04/11/2015 FINDINGS: The heart size is normal. Superior segment right lower lobe airspace disease is concerning for pneumonia or aspiration. The left lung is clear. There is no edema or effusion. The visualized soft tissues and bony thorax are unremarkable. IMPRESSION: 1. Right-sided airspace disease appears to be in the  superior segment right lower lobe representing either right lower lobe pneumonia or aspiration. Electronically Signed   By: Marin Roberts M.D.   On: 09/24/2016 18:07   Dg Perc Gastrostomy Tube Insert W/fluoro  Result Date: 09/24/2016 INDICATION: Intracranial hemorrhage, dysphagia, chronic gastrostomy use, feeding tube accidentally removed EXAM: FLUOROSCOPIC REPLACEMENT OF THE FEEDING TUBE MEDICATIONS: None. ANESTHESIA/SEDATION: None. The patient was continuously monitored during the procedure by the interventional radiology nurse under my direct supervision. CONTRAST:  10 cc - administered into the gastric lumen. FLUOROSCOPY TIME:  Fluoroscopy Time:  12 seconds (1.2 mGy). COMPLICATIONS: None immediate. PROCEDURE: Informed written consent was obtained from the patient's family after a thorough discussion of the procedural risks, benefits and alternatives. All questions were addressed. Maximal Sterile Barrier Technique was utilized including caps, mask, sterile gowns, sterile gloves, sterile drape, hand hygiene and skin antiseptic. A timeout was performed prior to the initiation of the procedure. Existing percutaneous gastrostomy site was cannulated with a catheter without difficulty. Amplatz guidewire inserted. Tract dilatation performed to advance 16 French balloon retention gastrostomy. Balloon inflated with 6 cc saline and retracted against anterior gastric wall. Contrast injection confirms position in the stomach. Images obtained for documentation. Feeding tube secured externally. Access ready for use. IMPRESSION: Successful fluoroscopic replacement of a 16 French balloon retention gastrostomy. Electronically Signed   By: Judie Petit.  Shick M.D.   On: 09/24/2016 16:36     Assessment/Plan:  51 y.o. female female with a known history of Stiffman syndrome and seizure disorder who presented to ED due to seizure andshe pulled out her NG tube during her seziure. Pt has frequent seizures and had 2 seizures  prior to presentation.  At home pt is on Keppra, Vimpat and zonisemide.  Currently appears close to baseline.   - Continue same home dose of Vimpat, Zonegram. - Keppra increased to 1gm BID - Observation overnight for feeding and possibly d/c tomorrow.   09/25/2016, 2:05 PM

## 2016-09-25 NOTE — Evaluation (Signed)
Clinical/Bedside Swallow Evaluation Patient Details  Name: Katherine Fields MRN: [id number] Date of Birth: Oct 09, 1965  Today's Date: 09/25/2016 Time: SLP Start Time (ACUTE ONLY): 0945 SLP Stop Time (ACUTE ONLY): 1045 SLP Time Calculation (min) (ACUTE ONLY): 60 min  Past Medical History:  Past Medical History:  Diagnosis Date  . Chronic pain   . G tube feedings (HCC)   . ICH (intracerebral hemorrhage) (HCC)   . Protein calorie malnutrition (HCC)   . Stiffman syndrome    Past Surgical History: History reviewed. No pertinent surgical history. HPI:      Assessment / Plan / Recommendation Clinical Impression  Pt appears to present w/ moderate oral phase dysphagia thus increasing risk for pharyngeal phase dysphagia and increasing risk for aspiration to occur w/ oral intake. Pt appeared to adequately tolerate trials of Dysphagia diet (puree) w/ Nectar liquids following aspiration precautions; feeding support. Pt has been eating an oral diet as well as taking TFs via her PEG per report (at home) prior to this hospitalization.  SLP Visit Diagnosis: Dysphagia, oropharyngeal phase (R13.12)    Aspiration Risk  Mild aspiration risk;Moderate aspiration risk    Diet Recommendation  Dysphagia level 1(Puree) w/ Nectar consistency liquids; strict aspiration precautions; feeding support  Medication Administration: Crushed with puree    Other  Recommendations Recommended Consults:  (Dietician f/u) Oral Care Recommendations: Oral care BID;Staff/trained caregiver to provide oral care Other Recommendations: Order thickener from pharmacy;Prohibited food (jello, ice cream, thin soups);Remove water pitcher;Have oral suction available   Follow up Recommendations Skilled Nursing facility      Frequency and Duration min 3x week  2 weeks       Prognosis Prognosis for Safe Diet Advancement: Fair Barriers to Reach Goals: Cognitive deficits;Severity of deficits;Time post onset      Swallow Study    General Date of Onset: 09/24/16 Type of Study: Bedside Swallow Evaluation Previous Swallow Assessment: no recent report indicated Diet Prior to this Study:  ("eats and drinks" by mouth; also PEG TFs at home) Temperature Spikes Noted: No (wbc not elevated) Respiratory Status: Nasal cannula (2 liters) History of Recent Intubation: No Behavior/Cognition: Alert;Cooperative;Pleasant mood;Distractible;Requires cueing (nonverbal baseline) Oral Cavity Assessment: Dry Oral Care Completed by SLP: Recent completion by staff Oral Cavity - Dentition: Poor condition;Missing dentition Vision:  (n/a) Self-Feeding Abilities: Total assist Patient Positioning: Upright in bed Baseline Vocal Quality:  (nonverbal baseline) Volitional Cough: Cognitively unable to elicit Volitional Swallow: Unable to elicit    Oral/Motor/Sensory Function Overall Oral Motor/Sensory Function: Moderate impairment Facial ROM: Within Functional Limits (grossly) Facial Symmetry: Within Functional Limits Lingual ROM: Reduced left Lingual Symmetry: Abnormal symmetry right Lingual Strength:  (Fair) Velum:  (NT)   Ice Chips Ice chips: Impaired Presentation: Spoon (1 trial) Oral Phase Impairments: Reduced labial seal;Poor awareness of bolus;Reduced lingual movement/coordination Oral Phase Functional Implications: Oral holding;Prolonged oral transit Pharyngeal Phase Impairments:  (none)   Thin Liquid Thin Liquid: Not tested    Nectar Thick Nectar Thick Liquid: Impaired Presentation: Spoon;Straw (fed; 5 trials via tsp; ~2 ozs via straw) Oral Phase Impairments: Reduced labial seal;Reduced lingual movement/coordination Oral phase functional implications: Prolonged oral transit Pharyngeal Phase Impairments:  (none)   Honey Thick Honey Thick Liquid: Not tested   Puree Puree: Impaired Presentation: Spoon (fed; ~2 ozs) Oral Phase Impairments: Reduced labial seal;Reduced lingual movement/coordination Oral Phase Functional  Implications: Prolonged oral transit Pharyngeal Phase Impairments:  (none)   Solid   GO   Solid: Not tested Other Comments: d/t overall presentation and oral phase dysphagia  Katherine Som, MS, CCC-SLP Katherine Fields 09/25/2016,4:38 PM

## 2016-09-25 NOTE — Progress Notes (Signed)
Pt pulled her EKG leads, PIV site and Peg Tube out, pt denies any pain, calm. NP notified, new PIV placed and green soft mitt applied to both arm. Will cont to monitor.

## 2016-09-25 NOTE — Progress Notes (Signed)
Initial Nutrition Assessment  DOCUMENTATION CODES:   Not applicable  INTERVENTION:  -Mighty Shakes TID between meals, Magic Cup at [employer] and [employer] -Calorie count ordered to assess nutritional intake  -Feeding assistance as needed -Food preferences discussed with pt and daughter  NUTRITION DIAGNOSIS:   Reassess on follow-up  GOAL:   Patient will meet greater than or equal to 90% of their needs  MONITOR:   PO intake, Supplement acceptance, Labs, Weight trends  REASON FOR ASSESSMENT:   Consult Assessment of nutrition requirement/status  ASSESSMENT:   51 yo female admitted with seizures. Pt with hx of ICH, stiffman syndrome, protein calorie malnutrition. Pt had G-tube but pt pulled out during the night 4/26.Pt is nonverbal  Pt is alert on visit, able to shake head "yes" or "no". Daughter at bedside who pt lives with. Per daughter, pt has been eating well at home on Regular Diet with Thin Liquids. Pt eats 3 meals per day: Breakfast is waffle or eggs/sausage, Lunch: sandwich or burger or similar, Dinner: meat/fish with sides. Pt also has a snack of vanilla sandwich cookies. Pt also drinks 3 Ensure per day. G-tube is out at present with no plans to replace. Daughter reports pt has not used G-tube for nutrition since October 2017. Pt was utilizing tube for med administration.  Today pt ate all but the dessert on her lunch meal trays. Pt typically feeds herself at home but has been assisted in hospital SLP has evaluated pt and pt currently on Dysphagia I with Nectar  Daughter reports pt has gained weight since October when tube feedings stopped Pt is bed bound and has not walked in several years per daughter  Nutrition-Focused physical exam completed. Findings are mild to severe fat depletion, no muscle depletion, and no edema. 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 Yes with Assist; Fluid consistency: Nectar Thick  Skin:  Reviewed, no issues  Last BM:  no documented BM  Height:   Ht Readings from Last 1 Encounters:  09/24/16  (1.676 m)    Weight:   Wt Readings from Last 1 Encounters:  09/25/16 120 lb 11.2 oz (54.7 kg)    BMI:  Body mass index is 19.48 kg/m.  Estimated Nutritional Needs:   Kcal:  1400-1650 kcals  Protein:  70-85  Fluid:  >/= 1.4 L  EDUCATION NEEDS:   No education needs identified at this time  Romelle Starcher MS, RD, LDN [phone] Pager  [phone] Weekend/On-Call Pager

## 2016-09-26 LAB — CBC
HCT: 34.5 % — ABNORMAL LOW (ref 35.0–47.0)
Hemoglobin: 11.6 g/dL — ABNORMAL LOW (ref 12.0–16.0)
MCH: 28.2 pg (ref 26.0–34.0)
MCHC: 33.5 g/dL (ref 32.0–36.0)
MCV: 84 fL (ref 80.0–100.0)
PLATELETS: 212 10*3/uL (ref 150–440)
RBC: 4.11 MIL/uL (ref 3.80–5.20)
RDW: 18.3 % — ABNORMAL HIGH (ref 11.5–14.5)
WBC: 5.9 10*3/uL (ref 3.6–11.0)

## 2016-09-26 LAB — BASIC METABOLIC PANEL
Anion gap: 7 (ref 5–15)
BUN: 15 mg/dL (ref 6–20)
CO2: 26 mmol/L (ref 22–32)
Calcium: 9.1 mg/dL (ref 8.9–10.3)
Chloride: 108 mmol/L (ref 101–111)
Creatinine, Ser: 0.87 mg/dL (ref 0.44–1.00)
GFR calc Af Amer: >60 mL/min (ref >60)
GLUCOSE: 80 mg/dL (ref 65–99)
POTASSIUM: 3.5 mmol/L (ref 3.5–5.1)
Sodium: 141 mmol/L (ref 135–145)

## 2016-09-26 LAB — HIV ANTIBODY (ROUTINE TESTING W REFLEX): HIV Screen 4th Generation wRfx: NONREACTIVE

## 2016-09-26 MED ORDER — LORAZEPAM 2 MG/ML PO CONC
0.5000 mg | Freq: Three times a day (TID) | ORAL | Status: DC | PRN
  Administered 2016-09-28: 12:00:00 0.5 mg via ORAL
  Filled 2016-09-26: qty 1

## 2016-09-26 MED ORDER — TUBERCULIN PPD 5 UNIT/0.1ML ID SOLN
5.0000 [IU] | Freq: Once | INTRADERMAL | Status: AC
  Administered 2016-09-26: 16:00:00 5 [IU] via INTRADERMAL
  Filled 2016-09-26: qty 0.1

## 2016-09-26 NOTE — Progress Notes (Signed)
Calorie Count Note  Calorie count ordered. Day 1 results below:  Diet: Dysphagia 1 with Nectar Thick liquids Supplements: Mighty Shakes BID between meals, Magic Cup at lunch and dinner  Lunch 4/26: 525 kcal, 28 grams of protein Dinner 4/26: 424 kcal, 20 grams of protein Breakfast 4/27: 372 kcal, 15 grams of protein Supplements: N/A (not recorded on receipts and daughter unsure if patient has had any)  Total intake: 1391 kcal (99% of minimum estimated needs)  63 grams of protein (90% of minimum estimated needs)  Estimated Nutritional Needs:  Kcal:  1400-1650 kcals Protein:  70-85 Fluid:  >/= 1.4 L  Goal: Patient will meet greater than or equal to 90% of their needs  Intervention:  Mighty Shake II BID between meals, each supplement provides 480-500 kcals and 20-23 grams of protein. Magic cup BID with lunch and dinner, each supplement provides 290 kcal and 9 grams of protein.  Katherine Rima, MS, RD, LDN Pager: [phone] After Hours Pager: [phone]

## 2016-09-26 NOTE — Plan of Care (Signed)
Problem: Nutrition: Goal: Adequate nutrition will be maintained Outcome: Progressing Calorie count continues per [facility] order; pt total assist/feed with meals

## 2016-09-26 NOTE — Progress Notes (Signed)
[facility] - [place] at [facility]   PATIENT NAME: Katherine Fields    MR#:  [id number]  DATE OF BIRTH:  09-16-1965  SUBJECTIVE:  CHIEF COMPLAINT:   Chief Complaint  Patient presents with  . Seizures  . g-tube replacement   Awake but confused. Discussed with daughter.  On dysphagia diet and tolerating well. Calorie count in progress.  REVIEW OF SYSTEMS:    Review of Systems  Unable to perform ROS: Mental acuity    DRUG ALLERGIES:   Allergies  Allergen Reactions  . Tramadol     Other reaction(s): Unknown    VITALS:  Blood pressure 134/83, pulse 93, temperature 97.9 F (36.6 C), temperature source Oral, resp. rate 20, height  (1.676 m), weight 54.7 kg (120 lb 11.2 oz), SpO2 100 %.  PHYSICAL EXAMINATION:   Physical Exam  GENERAL:  51 y.o.-year-old patient lying in the bed with no acute distress.  EYES: Pupils equal, round, reactive to light and accommodation. No scleral icterus. Extraocular muscles intact.  HEENT: Head atraumatic, normocephalic. Oropharynx and nasopharynx clear.  NECK:  Supple, no jugular venous distention. No thyroid enlargement, no tenderness.  LUNGS: Normal breath sounds bilaterally, no wheezing, rales, rhonchi. No use of accessory muscles of respiration.  CARDIOVASCULAR: S1, S2 normal. No murmurs, rubs, or gallops.  ABDOMEN: Soft, non tender. PEG site has no bleeding EXTREMITIES: No cyanosis, clubbing or edema b/l.    NEUROLOGIC: Moves arms PSYCHIATRIC: The patient is alert and awake. Follows commands but non verbal  LABORATORY PANEL:   CBC  Recent Labs Lab 09/26/16 0457  WBC 5.9  HGB 11.6*  HCT 34.5*  PLT 212   ------------------------------------------------------------------------------------------------------------------ Chemistries   Recent Labs Lab 09/24/16 1216  09/26/16 0457  NA 141  < > 141  K 3.4*  < > 3.5  CL 103  < > 108  CO2 29  < > 26  GLUCOSE 81  < > 80  BUN 10  < > 15  CREATININE 0.81  <  > 0.87  CALCIUM 9.8  < > 9.1  AST 30  --   --   ALT 16  --   --   ALKPHOS 119  --   --   BILITOT 0.5  --   --   < > = values in this interval not displayed. ------------------------------------------------------------------------------------------------------------------  Cardiac Enzymes No results for input(s): TROPONINI in the last 168 hours. ------------------------------------------------------------------------------------------------------------------  RADIOLOGY:  Dg Chest Portable 1 View  Result Date: 09/24/2016 CLINICAL DATA:  Seizure today. EXAM: PORTABLE CHEST 1 VIEW COMPARISON:  Acute abdominal series 04/11/2015 FINDINGS: The heart size is normal. Superior segment right lower lobe airspace disease is concerning for pneumonia or aspiration. The left lung is clear. There is no edema or effusion. The visualized soft tissues and bony thorax are unremarkable. IMPRESSION: 1. Right-sided airspace disease appears to be in the superior segment right lower lobe representing either right lower lobe pneumonia or aspiration. Electronically Signed   By: Katherine Fields M.D.   On: 09/24/2016 18:07     ASSESSMENT AND PLAN:   * Recurrent seizures This has been a chronic issue On Vimpat, Zonisamide, Keppra. Seizure precautions Procedures in the hospital  * Dysphagia with PEG tube Patient pulled out her tube. Second instance Tolerating dysphagia diet Discussed with daughter regarding not placing the PEG tube again due to high risk for pulling it out. She agrees. Calorie count in progress  * Post ictal state has resolved  * May need palliative  evaluation  All the records are reviewed and case discussed with Care Management/Social Worker Management plans discussed with the patient, family and they are in agreement.  CODE STATUS: FULL CODE  DVT Prophylaxis: SCDs  TOTAL TIME TAKING CARE OF THIS PATIENT: 30 minutes.   Katherine Fields R M.D on 09/26/2016 at 4:23 PM  Between 7am  to 6pm - Pager - [phone]  After 6pm go to [email or web address] - password [email or web address] [facility]  [facility] [place] [facility]  Office  [phone]  CC: Primary care physician; No PCP Per Patient  Note: This dictation was prepared with Dragon dictation along with smaller phrase technology. Any transcriptional errors that result from this process are unintentional.

## 2016-09-26 NOTE — Progress Notes (Signed)
Pt restless and pulling off telemetry, making vocalizations but incomprehensible. Blood pressure mildly elevated. Oral ativan given. Henriette Combs RN

## 2016-09-26 NOTE — Plan of Care (Signed)
Problem: Nutrition: Goal: Adequate nutrition will be maintained Outcome: Progressing Calorie count in progress

## 2016-09-26 NOTE — Plan of Care (Signed)
Problem: Safety: Goal: Verbalization of understanding the information provided will improve Outcome: Progressing Seizure pads remain in place on all 4 siderails; pt remains seizure free to date this shift

## 2016-09-26 NOTE — Plan of Care (Signed)
Problem: Safety: Goal: Ability to remain free from injury will improve Outcome: Progressing Pt's Fall Risk remains High; free from injury to date this shift

## 2016-09-26 NOTE — Care Management (Addendum)
Admitted to this facility with the diagnosis of seizure activity. Lives with family  in [place]. Unable to get in touch with Darrick Meigs [phone]). Last seen Dr. Ludwig Clarks 09/17/16. History of multiple visits to the emergency room for falls and seizure activity. Pulled G-tube out x 2. Speech evaluation completed. Started on Dysphagia I diet, Calorie count started. Discharged to [facility] 10/03/2010 Gwenette Greet RN MSN CCM Care Management [phone]

## 2016-09-27 NOTE — Progress Notes (Signed)
[facility] - [place] at [facility]   PATIENT NAME: Katherine Fields    MR#:  [id number]  DATE OF BIRTH:  [birth year]  SUBJECTIVE:  CHIEF COMPLAINT:   Chief Complaint  Patient presents with  . Seizures  . g-tube replacement   Awake. Follows commands. Nonverbal. Tolerating diet well.  REVIEW OF SYSTEMS:    Review of Systems  Unable to perform ROS: Mental acuity    DRUG ALLERGIES:   Allergies  Allergen Reactions  . Tramadol     Other reaction(s): Unknown    VITALS:  Blood pressure (!) 140/96, pulse 89, temperature 98.8 F (37.1 C), temperature source Oral, resp. rate 16, height  (1.676 m), weight 54.7 kg (120 lb 11.2 oz), SpO2 98 %.  PHYSICAL EXAMINATION:   Physical Exam  GENERAL:  51 y.o.-year-old patient lying in the bed with no acute distress.  EYES: Pupils equal, round, reactive to light and accommodation. No scleral icterus. Extraocular muscles intact.  HEENT: Head atraumatic, normocephalic. Oropharynx and nasopharynx clear.  NECK:  Supple, no jugular venous distention. No thyroid enlargement, no tenderness.  LUNGS: Normal breath sounds bilaterally, no wheezing, rales, rhonchi. No use of accessory muscles of respiration.  CARDIOVASCULAR: S1, S2 normal. No murmurs, rubs, or gallops.  ABDOMEN: Soft, non tender. PEG site has no bleeding EXTREMITIES: No cyanosis, clubbing or edema b/l.    NEUROLOGIC: Moves arms PSYCHIATRIC: The patient is alert and awake. Follows commands but non verbal  LABORATORY PANEL:   CBC  Recent Labs Lab 09/26/16 0457  WBC 5.9  HGB 11.6*  HCT 34.5*  PLT 212   ------------------------------------------------------------------------------------------------------------------ Chemistries   Recent Labs Lab 09/24/16 1216  09/26/16 0457  NA 141  < > 141  K 3.4*  < > 3.5  CL 103  < > 108  CO2 29  < > 26  GLUCOSE 81  < > 80  BUN 10  < > 15  CREATININE 0.81  < > 0.87  CALCIUM 9.8  < > 9.1  AST 30  --   --    ALT 16  --   --   ALKPHOS 119  --   --   BILITOT 0.5  --   --   < > = values in this interval not displayed. ------------------------------------------------------------------------------------------------------------------  Cardiac Enzymes No results for input(s): TROPONINI in the last 168 hours. ------------------------------------------------------------------------------------------------------------------  RADIOLOGY:  No results found.   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 Seizure precautions No further seizures in the hospital  * Dysphagia with PEG tube Patient pulled out her tube.Second instance Tolerating dysphagia diet Discussed with daughter regarding not placing the PEG tube again due to high risk for pulling it out. She agrees. Patient meeting her nutritional needs even without insurance or mighty shake. Discussed with dietitian.  * Post ictal state has resolved  All the records are reviewed and case discussed with Care Management/Social Worker Management plans discussed with the patient, family and they are in agreement.  CODE STATUS: FULL CODE  DVT Prophylaxis: SCDs  TOTAL TIME TAKING CARE OF THIS PATIENT: 30 minutes.   Milagros Loll R M.D on 09/27/2016 at 10:49 AM  Between 7am to 6pm - Pager - [phone]  After 6pm go to [email or web address] - password [email or web address]  [facility] [place] [facility]  Office  [phone]  CC: Primary care physician; No PCP Per Patient  Note: This dictation was prepared with Dragon dictation along with smaller  Company secretary. Any transcriptional errors that result from this process are unintentional.

## 2016-09-27 NOTE — NC FL2 (Signed)
[place] MEDICAID FL2 LEVEL OF CARE SCREENING TOOL     IDENTIFICATION  Patient Name: Katherine Fields Birthdate: 1965-09-10 Sex: female Admission Date (Current Location): 09/24/2016  [place] and [place] Number:  Chiropodist and Address:  [facility], [address] [place], [place] [address]      Provider Number: [id number]  Attending Physician Name and Address:  Milagros Loll, MD  Relative Name and Phone Number:       Current Level of Care: Hospital Recommended Level of Care: Skilled Nursing Facility Prior Approval Number:    Date Approved/Denied:   PASRR Number: [id number]  Discharge Plan: SNF    Current Diagnoses: Patient Active Problem List   Diagnosis Date Noted  . Epilepsy, generalized, convulsive (HCC)   . Dysphagia   . Seizures (HCC) 09/24/2016  . Seizure (HCC) 03/14/2016    Orientation RESPIRATION BLADDER Height & Weight     Self, Situation  Normal Continent Weight: 120 lb 11.2 oz (54.7 kg) Height:   (167.6 cm)  BEHAVIORAL SYMPTOMS/MOOD NEUROLOGICAL BOWEL NUTRITION STATUS    Convulsions/Seizures Continent Diet (Dysphagia 1 with nectar thick liquids.)  AMBULATORY STATUS COMMUNICATION OF NEEDS Skin   Extensive Assist Non-Verbally Normal                       Personal Care Assistance Level of Assistance  Bathing, Feeding, Dressing Bathing Assistance: Limited assistance Feeding assistance: Limited assistance Dressing Assistance: Limited assistance     Functional Limitations Info  Speech     Speech Info: Impaired (nonverbal)    SPECIAL CARE FACTORS FREQUENCY  PT (By licensed PT), Speech therapy     PT Frequency: By licensed PT in facility.       Speech Therapy Frequency: PRN to advance diet via swallow study      Contractures Contractures Info: Present    Additional Factors Info  Allergies   Allergies Info: Tramadol           Current Medications (09/27/2016):  This is the current  hospital active medication list Current Facility-Administered Medications  Medication Dose Route Frequency Provider Last Rate Last Dose  . acetaminophen (TYLENOL) tablet 650 mg  650 mg Oral Q6H PRN Adrian Saran, MD       Or  . acetaminophen (TYLENOL) suppository 650 mg  650 mg Rectal Q6H PRN Adrian Saran, MD      . baclofen (LIORESAL) tablet 5 mg  5 mg Oral Q6H Srikar Sudini, MD   5 mg at 09/27/16 1538  . DULoxetine (CYMBALTA) DR capsule 60 mg  60 mg Oral Daily Adrian Saran, MD   60 mg at 09/27/16 0947  . enoxaparin (LOVENOX) injection 40 mg  40 mg Subcutaneous Q24H Adrian Saran, MD   40 mg at 09/27/16 0116  . HYDROcodone-acetaminophen (NORCO/VICODIN) 5-325 MG per tablet 1-2 tablet  1-2 tablet Oral Q4H PRN Milagros Loll, MD   1 tablet at 09/25/16 2043  . lacosamide (VIMPAT) tablet 200 mg  200 mg Oral BID Adrian Saran, MD   200 mg at 09/27/16 0946  . levETIRAcetam (KEPPRA) 100 MG/ML solution 1,000 mg  1,000 mg Oral BID Milagros Loll, MD   1,000 mg at 09/27/16 0946  . LORazepam (ATIVAN) 2 MG/ML concentrated solution 0.5 mg  0.5 mg Oral Q8H PRN Srikar Sudini, MD      . ondansetron (ZOFRAN) tablet 4 mg  4 mg Oral Q6H PRN Adrian Saran, MD       Or  . ondansetron (  ZOFRAN) injection 4 mg  4 mg Intravenous Q6H PRN Sital Mody, MD      . senna-docusate (Senokot-S) tablet 1 tablet  1 tablet Oral QHS PRN Adrian Saran, MD      . tuberculin injection 5 Units  5 Units Intradermal Once Milagros Loll, MD   5 Units at 09/26/16 1602  . zonisamide (ZONEGRAN) capsule 100 mg  100 mg Oral BID Gwendolyn Fill, NP   100 mg at 09/27/16 1610     Discharge Medications: Please see discharge summary for a list of discharge medications.  Relevant Imaging Results:  Relevant Lab Results:   Additional Information SS# [id number]  Judi Cong, LCSW

## 2016-09-27 NOTE — Progress Notes (Signed)
Speech Language Pathology Dysphagia Treatment Patient Details Name: Katherine Fields MRN: [id number] DOB: 02/08/1966 Today's Date: 09/27/2016 Time: 8295-6213 SLP Time Calculation (min) (ACUTE ONLY): 17 min  Assessment / Plan / Recommendation Clinical Impression   pt continues to present with a moderate oral pharyngeal dysphagia characterized by poor oral motor status and an impaired ability to functionally masticate solids. Pt had difficulty with moving solids a-p utilizing lingual movements but can move from roof of mouth. Pt had audible swallow and multiple swallows noted for thin liquids. SLP continues to recommend dys 1 with nectar due to poor oral control and diet safety at this time. NSG educated and agreed to plan.     Diet Recommendation    Dys 1 with nectar   SLP Plan Continue with current plan of care   Pertinent Vitals/Pain None reported    Swallowing Goals     General Behavior/Cognition: Alert;Cooperative;Pleasant mood Patient Positioning: Upright in bed Oral care provided: N/A  Oral Cavity - Oral Hygiene     Dysphagia Treatment Treatment Methods: Skilled observation;Upgraded PO texture trial;Patient/caregiver education Patient observed directly with PO's: Yes Feeding: Total assist Liquids provided via: Teaspoon;Cup Oral Phase Signs & Symptoms: Prolonged mastication;Prolonged bolus formation;Anterior loss/spillage Pharyngeal Phase Signs & Symptoms: Multiple swallows;Audible swallow;Suspected delayed swallow initiation Type of cueing: Verbal;Tactile Amount of cueing: Moderate   GO     Meredith Pel Sauber 09/27/2016, 11:23 AM

## 2016-09-27 NOTE — Plan of Care (Signed)
Problem: Health Behavior/Discharge Planning: Goal: Compliance with prescribed medication regimen will improve Outcome: Progressing Pt remains seizure free to date this shift; discharge plans for pt to be discharged to either assisted living or SNF; Care Management involved

## 2016-09-27 NOTE — Evaluation (Signed)
Physical Therapy Evaluation Patient Details Name: Katherine Fields MRN: [id number] DOB: September 10, 1965 Today's Date: 09/27/2016   History of Present Illness  Pt presented to ED with seizures and pt pulled out NG tube during seizures. Pt with hx of ICH, Stiffman syndrome, and gastrostomy per MD note.  Clinical Impression  Pt is a 51y/o female presenting to hospital with seizures. Pt reported 2 falls in the last 6 months and requires assist during all ADLs at home and has not ambulated in years. Family member not present to verify information, will need to f/u with family. Pt reports she uses w/c for mobility and relies on family for everything. Pt required mod A during bed mobility and min A to mod A to maintain seated balance while sitting EOB. PT did not attempt amb. 2/2 pt reports of not ambulating or standing in years and 2/2 weakness, impaired balance, and decreased ROM. Pt tolerated therex well and did require cues to improve eccentric control. No c/o of pain during session. PT recommending SNF, as pt would benefit from skilled PT to address above deficits and promote optimal return to PLOF.     Follow Up Recommendations SNF    Equipment Recommendations  Other (comment) (verify with family )    Recommendations for Other Services       Precautions / Restrictions Precautions Precautions: Fall;Other (comment) (seizure precautions) Restrictions Weight Bearing Restrictions: No      Mobility  Bed Mobility Overal bed mobility: Needs Assistance Bed Mobility: Sit to Supine;Supine to Sit     Supine to sit: Mod assist;HOB elevated Sit to supine: Mod assist   General bed mobility comments: Pt provided mod A to guide BLEs in/out of bed and to guide trunk during txfs, as pt's B LEs locked into extension during txfs. Cues for technique.  Transfers                 General transfer comment: STS not attempted 2/2 weakness and  poor seated balance, pt reported she has not perform STS txf  in years.  Ambulation/Gait             General Gait Details: Not attempted 2/2 decr. strength/ROM and poor balance. Pt reported she has not amb. in years.  Stairs            Wheelchair Mobility    Modified Rankin (Stroke Patients Only)       Balance                                             Pertinent Vitals/Pain Pain Assessment: No/denies pain    Home Living Family/patient expects to be discharged to:: Private residence Living Arrangements: Children Available Help at Discharge: Family Type of Home: House Home Access: Level entry     Home Layout: One level Home Equipment: Wheelchair - manual Additional Comments: Need to verify living arrangements with pt's dtr, as pt reported she lives with dtr and requires assist during all txfs and ADLs. Pt stated she has manual w/c and no other equipment.     Prior Function Level of Independence: Needs assistance   Gait / Transfers Assistance Needed: Pt reported she has not amb. in years  ADL's / Homemaking Assistance Needed: Needs assist with all ADLs: bathing, dressing, eating  Comments: Pt reported 2 falls in last 6 months.     Hand Dominance  Extremity/Trunk Assessment   Upper Extremity Assessment Upper Extremity Assessment: Generalized weakness (Decr. ROM and weakness likely 2/2 Stiffman syndrome)    Lower Extremity Assessment Lower Extremity Assessment: RLE deficits/detail;LLE deficits/detail RLE Deficits / Details: PT unable to perform MMT 2/2 poor sitting balance but pt was able to move BLEs against gravity while seated EOB with PT providing min A to pt's trunk for balance. Pt's B ankle ROM decr. likely 2/2 Stiffman syndrome. Pt able to obtain full B knee ext with several attempts. Pt denied N/T in BLEs       Communication   Communication: Other (comment) (difficult to understand at times due to slurred speech)  Cognition Arousal/Alertness: Awake/alert Behavior During  Therapy: Flat affect Overall Cognitive Status: No family/caregiver present to determine baseline cognitive functioning                                        General Comments General comments (skin integrity, edema, etc.): PT noted pt's sheets had blood on Fields side of sheets upon entering room, it may have been from LUE IV site. PT called to notify RN but RN did not answer phone, PT will attempt again. PT did change sheets.    Exercises General Exercises - Upper Extremity Shoulder Flexion: Strengthening;Both;10 reps;Supine General Exercises - Lower Extremity Ankle Circles/Pumps: AROM;10 reps;Supine;Both Quad Sets: Both;Strengthening;5 reps;Supine Gluteal Sets: Strengthening;10 reps;Supine;Both Short Arc Quad: Strengthening;Both;10 reps;Supine Heel Slides: Strengthening;Both;10 reps Hip ABduction/ADduction: Strengthening;10 reps;Both;Supine Straight Leg Raises: Other (comment) (attempted but pt unable to perform) Other Exercises Other Exercises: Cues and min A to S during all exercises for proper technique.   Assessment/Plan    PT Assessment Patient needs continued PT services  PT Problem List Decreased strength;Decreased range of motion;Decreased activity tolerance;Decreased balance;Decreased mobility;Decreased coordination;Decreased knowledge of use of DME;Other (comment) (Stiffman syndrome)       PT Treatment Interventions DME instruction;Gait training;Functional mobility training;Therapeutic activities;Therapeutic exercise;Balance training;Neuromuscular re-education;Patient/family education;Wheelchair mobility training;Manual techniques    PT Goals (Current goals can be found in the Care Plan section)  Acute Rehab PT Goals Patient Stated Goal: To get out of here. PT Goal Formulation: With patient Time For Goal Achievement: 10/11/16 Potential to Achieve Goals: Fair    Frequency Min 2X/week   Barriers to discharge Other (comment) (need to verify home  environment with family)      Co-evaluation               End of Session   Activity Tolerance: Patient tolerated treatment well Patient left: in bed;with call bell/phone within reach;with bed alarm set   PT Visit Diagnosis: Other abnormalities of gait and mobility (R26.89);Muscle weakness (generalized) (M62.81);Difficulty in walking, not elsewhere classified (R26.2);Repeated falls (R29.6);Other symptoms and signs involving the nervous system (R29.898)    Time: 2956-2130 PT Time Calculation (min) (ACUTE ONLY): 22 min   Charges:   PT Evaluation $PT Eval Low Complexity: 1 Procedure PT Treatments $Therapeutic Exercise: 8-22 mins   PT G Codes:        Zerita Boers, PT,DPT 09/27/16 9:44 AM     Katherine Fields 09/27/2016, 9:40 AM

## 2016-09-28 NOTE — Clinical Social Work Note (Signed)
Clinical Social Work Assessment  Patient Details  Name: Katherine Fields MRN: [id number] Date of Birth: Apr 02, 1966  Date of referral:  09/28/16               Reason for consult:  Facility Placement                Permission sought to share information with:  Facility Industrial/product designer granted to share information::  Yes, Verbal Permission Granted  Name::        Agency::     Relationship::     Contact Information:     Housing/Transportation Living arrangements for the past 2 months:  Skilled Nursing Facility Source of Information:  Adult Children Patient Interpreter Needed:  None Criminal Activity/Legal Involvement Pertinent to Current Situation/Hospitalization:  No - Comment as needed Significant Relationships:  Adult Children Lives with:  Adult Children Do you feel safe going back to the place where you live?  Yes Need for family participation in patient care:  Yes (Comment)  Care giving concerns:  PT recommendation for SNF   Social Worker assessment / plan:  CSW attempted to discuss discharge planning with patient at bedside. The patient was sleeping soundly. CSW contacted the patient's daughter, Katherine Fields, who gave verbal permission to conduct SNF search and indicated that [facility] is the first choice. The CSW explained the referral process and Medicare guidelines.   At baseline, the patient is non-ambulatory and lives with her daughter. The daughter plans to discuss possible ALF placement with the facility social worker while her mother is completing STR.  CSW will continue to assist with discharge planning.  Employment status:  Retired Health and safety inspector:  [employer] PT Recommendations:  Skilled Nursing Facility Information / Referral to community resources:  Skilled Nursing Facility  Patient/Family's Response to care:  Patient's daughter thanked CSW.  Patient/Family's Understanding of and Emotional Response to Diagnosis, Current Treatment, and  Prognosis:  The patient's daughter understands and agrees with the discharge plan.  Emotional Assessment Appearance:  Appears stated age Attitude/Demeanor/Rapport:   (Patient was sleeping soundly) Affect (typically observed):   (Patient was sleeping soundly) Orientation:   (Patient was sleeping soundly) Alcohol / Substance use:  Never Used Psych involvement (Current and /or in the community):  No (Comment)  Discharge Needs  Concerns to be addressed:  Care Coordination Readmission within the last 30 days:  No Current discharge risk:  Chronically ill Barriers to Discharge:  Continued Medical Work up   [employer], LCSW 09/28/2016, 4:43 PM

## 2016-09-28 NOTE — Clinical Social Work Placement (Addendum)
   CLINICAL SOCIAL WORK PLACEMENT  NOTE  Date:  09/28/2016  Patient Details  Name: Damaria Vachon MRN: [id number] Date of Birth: Jun 17, 1965  Clinical Social Work is seeking post-discharge placement for this patient at the Skilled  Nursing Facility level of care (*CSW will initial, date and re-position this form in  chart as items are completed):  Yes   Patient/family provided with [place] Clinical Social Work Department's list of facilities offering this level of care within the geographic area requested by the patient (or if unable, by the patient's family).  Yes   Patient/family informed of their freedom to choose among providers that offer the needed level of care, that participate in Medicare, Medicaid or managed care program needed by the patient, have an available bed and are willing to accept the patient.  Yes   Patient/family informed of [place]'s ownership interest in [facility] and [facility], as well as of the fact that they are under no obligation to receive care at these facilities.  PASRR submitted to EDS on       PASRR number received on       Existing PASRR number confirmed on 09/28/16     FL2 transmitted to all facilities in geographic area requested by pt/family on 09/28/16     FL2 transmitted to all facilities within larger geographic area on       Patient informed that his/her managed care company has contracts with or will negotiate with certain facilities, including the following:         09-30-16   Patient/family informed of bed offers received. Windell Moulding, MSW, [place], updated 09-30-16)  Patient chooses bed at  [facility]  Windell Moulding, MSW, [place], updated 09-30-16)      Physician recommends and patient chooses bed at      Patient to be transferred to  [facility] on  09-30-16  Windell Moulding, MSW, [place], updated 09-30-16).  Patient to be transferred to facility by  [facility]  Windell Moulding, MSW, [place],  updated 09-30-16)    Patient family notified on  09-30-16 of transfer.  Windell Moulding, MSW, [place], updated 09-30-16)   Name of family member notified:    Miranda daughter    PHYSICIAN       Additional Comment:    _______________________________________________ Judi Cong, LCSW 09/28/2016, 4:46 PM

## 2016-09-28 NOTE — Progress Notes (Signed)
[facility] - [place] at [facility]   PATIENT NAME: Katherine Fields    MR#:  [id number]  DATE OF BIRTH:  [birth year]  SUBJECTIVE:  CHIEF COMPLAINT:   Chief Complaint  Patient presents with  . Seizures  . g-tube replacement  Admitted for recurrent seizures and G-tube replacement. Tube was replaced by IR but then patient pulled it out again.  She does have on and off seizures. Keppra dose increased and no seizures in the hospital.  Has been tolerating mechanical soft diet with nectar thickened liquids. Calorie count done. Meeting her nutritional needs without a PEG tube or nutritional supplements.  Awake. Follows commands. Nonverbal.   REVIEW OF SYSTEMS:    Review of Systems  Unable to perform ROS: Mental acuity    DRUG ALLERGIES:   Allergies  Allergen Reactions  . Tramadol     Other reaction(s): Unknown    VITALS:  Blood pressure (!) 156/90, pulse 94, temperature 98.1 F (36.7 C), temperature source Oral, resp. rate 18, height  (1.676 m), weight 54.7 kg (120 lb 11.2 oz), SpO2 98 %.  PHYSICAL EXAMINATION:   Physical Exam  GENERAL:  51 y.o.-year-old patient lying in the bed with no acute distress.  EYES: Pupils equal, round, reactive to light and accommodation. No scleral icterus. Extraocular muscles intact.  HEENT: Head atraumatic, normocephalic. Oropharynx and nasopharynx clear.  NECK:  Supple, no jugular venous distention. No thyroid enlargement, no tenderness.  LUNGS: Normal breath sounds bilaterally, no wheezing, rales, rhonchi. No use of accessory muscles of respiration.  CARDIOVASCULAR: S1, S2 normal. No murmurs, rubs, or gallops.  ABDOMEN: Soft, non tender. PEG site has no bleeding EXTREMITIES: No cyanosis, clubbing or edema b/l.    NEUROLOGIC: Moves arms PSYCHIATRIC: The patient is alert and awake. Follows commands but non verbal  LABORATORY PANEL:   CBC  Recent Labs Lab 09/26/16 0457  WBC 5.9  HGB 11.6*  HCT 34.5*  PLT 212    ------------------------------------------------------------------------------------------------------------------ Chemistries   Recent Labs Lab 09/24/16 1216  09/26/16 0457  NA 141  < > 141  K 3.4*  < > 3.5  CL 103  < > 108  CO2 29  < > 26  GLUCOSE 81  < > 80  BUN 10  < > 15  CREATININE 0.81  < > 0.87  CALCIUM 9.8  < > 9.1  AST 30  --   --   ALT 16  --   --   ALKPHOS 119  --   --   BILITOT 0.5  --   --   < > = values in this interval not displayed. ------------------------------------------------------------------------------------------------------------------  Cardiac Enzymes No results for input(s): TROPONINI in the last 168 hours. ------------------------------------------------------------------------------------------------------------------  RADIOLOGY:  No results found.   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 Seizure precautions No further seizures in the hospital  * Dysphagia with PEG tube Patient pulled out her tube.Second instance Tolerating dysphagia diet Discussed with daughter regarding not placing the PEG tube again due to high risk for pulling it out. She agrees. Patient meeting her nutritional needs even without insurance or mighty shake. Discussed with dietitian.  * Post ictal state has resolved  A PPD test was done to coordinate patient's transfer to assisted living facility if needed. This needs to be read after 7 PM today.  All the records are reviewed and case discussed with Care Management/Social Worker Management plans discussed with the patient,  family and they are in agreement.  CODE STATUS: FULL CODE  DVT Prophylaxis: SCDs  TOTAL TIME TAKING CARE OF THIS PATIENT: 30 minutes.   Likely discharge tomorrow to skilled nursing facility.  Milagros Loll R M.D on 09/28/2016 at 10:12 AM  Between 7am to 6pm - Pager - [phone]  After 6pm go to [email or web address] -  password [email or web address]  [employer] [facility]  Office  [phone]  CC: Primary care physician; No PCP Per Patient  Note: This dictation was prepared with Dragon dictation along with smaller phrase technology. Any transcriptional errors that result from this process are unintentional.

## 2016-09-28 NOTE — Progress Notes (Signed)
Calorie Count Note  Calorie count ordered. Day 2 results below:  Diet: Dysphagia 1 with Nectar Thick liquids Supplements: Mighty Shakes BID between meals, Magic Cup at lunch and dinner  Breakfast 4/27: 372 kcal, 15 grams of protein Lunch 4/27: 774 kcal, 32 grams of protein (100% meal and Magic Cup) Dinner 4/27: 1283 kcal, 48 grams of protein (100% meal and Magic Cup) Breakfast 4/28: 972 kcal, 27 grams of protein Lunch 4/28: 956 kcal, 37g protein (100% meal and Magic Cup) Dinner 4/28: 981 kcal, 37g protein (100% meal and Magic Cup) Supplements: Lunch 4/27- 100% Mighty Shake, Lunch 4/28- 100% Mighty Shake  Total intake: 4/27 2729 kcal (165% of minimum estimated needs)  106 grams of protein (125% of minimum estimated needs)  Total Intake 4/28 3209 kcal (195% of minimum estimated needs)  112 grams of protein (132% of minimum estimated needs)  Estimated Nutritional Needs:  Kcal:  1400-1650 kcals Protein:  70-85 Fluid:  >/= 1.4 L  Goal: Patient will meet greater than or equal to 90% of their needs  Intervention:  Mighty Shake II BID between meals, each supplement provides 480-500 kcals and 20-23 grams of protein. Magic cup BID with lunch and dinner, each supplement provides 290 kcal and 9 grams of protein.  Betsey Holiday, RD, LDN Pager #- [phone]

## 2016-09-29 MED ORDER — AMLODIPINE BESYLATE 5 MG PO TABS
5.0000 mg | ORAL_TABLET | Freq: Every day | ORAL | Status: DC
  Administered 2016-09-29 – 2016-09-30 (×2): 5 mg via ORAL
  Filled 2016-09-29 (×2): qty 1

## 2016-09-29 NOTE — Progress Notes (Addendum)
[facility] - [place] at [facility]   PATIENT NAME: Katherine Fields    MR#:  [id number]  DATE OF BIRTH:  18-Sep-1965  SUBJECTIVE:  CHIEF COMPLAINT:   Chief Complaint  Patient presents with  . Seizures  . g-tube replacement  Admitted for recurrent seizures and G-tube replacement. Tube was replaced by IR but then patient pulled it out again.  She does have on and off seizures. Keppra dose increased and no seizures in the hospital.  Has been tolerating mechanical soft diet with nectar thickened liquids. Calorie count done. Meeting her nutritional needs without a PEG tube or nutritional supplements.  Awake. Follows commands. Nonverbal. Remains about the same, waiting for placement, eating her breakfast,   REVIEW OF SYSTEMS:    Review of Systems  Unable to perform ROS: Mental acuity   DRUG ALLERGIES:   Allergies  Allergen Reactions  . Tramadol     Other reaction(s): Unknown    VITALS:  Blood pressure (!) 165/93, pulse 95, temperature 98.7 F (37.1 C), temperature source Oral, resp. rate 18, height  (1.676 m), weight 54.7 kg (120 lb 11.2 oz), SpO2 99 %.  PHYSICAL EXAMINATION:   Physical Exam  GENERAL:  51 y.o.-year-old patient lying in the bed with no acute distress.  EYES: Pupils equal, round, reactive to light and accommodation. No scleral icterus. Extraocular muscles intact.  HEENT: Head atraumatic, normocephalic. Oropharynx and nasopharynx clear.  NECK:  Supple, no jugular venous distention. No thyroid enlargement, no tenderness.  LUNGS: Normal breath sounds bilaterally, no wheezing, rales, rhonchi. No use of accessory muscles of respiration.  CARDIOVASCULAR: S1, S2 normal. No murmurs, rubs, or gallops.  ABDOMEN: Soft, non tender. PEG site has no bleeding EXTREMITIES: No cyanosis, clubbing or edema b/l.    NEUROLOGIC: Moves arms PSYCHIATRIC: The patient is alert and awake. Follows commands but non verbal  LABORATORY PANEL:   CBC  Recent  Labs Lab 09/26/16 0457  WBC 5.9  HGB 11.6*  HCT 34.5*  PLT 212   ------------------------------------------------------------------------------------------------------------------ Chemistries   Recent Labs Lab 09/24/16 1216  09/26/16 0457  NA 141  < > 141  K 3.4*  < > 3.5  CL 103  < > 108  CO2 29  < > 26  GLUCOSE 81  < > 80  BUN 10  < > 15  CREATININE 0.81  < > 0.87  CALCIUM 9.8  < > 9.1  AST 30  --   --   ALT 16  --   --   ALKPHOS 119  --   --   BILITOT 0.5  --   --   < > = values in this interval not displayed. ------------------------------------------------------------------------------------------------------------------  Cardiac Enzymes No results for input(s): TROPONINI in the last 168 hours. ------------------------------------------------------------------------------------------------------------------  RADIOLOGY:  No results found.   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 Seizure precautions No further seizures in the hospital  * Hypertension - We will add Norvasc for better blood pressure control  * Dysphagia with PEG tube Patient pulled out her tube.Second instance Tolerating dysphagia diet Dr. Elpidio Anis has Discussed with daughter regarding not placing the PEG tube again due to high risk for pulling it out. She agrees. Patient meeting her nutritional needs even without insurance or mighty shake. Discussed with dietitian.  * Post ictal state has resolved  A PPD test was done to coordinate patient's transfer to assisted living facility if needed. It is negative.  All the records are reviewed and case discussed with Care Management/Social Worker Management plans discussed with the patient, nursing and they are in agreement.  CODE STATUS: FULL CODE  DVT Prophylaxis: SCDs  TOTAL TIME TAKING CARE OF THIS PATIENT: 30 minutes.   Likely discharge tomorrow to skilled nursing  facility.  Delfino Lovett M.D on 09/29/2016 at 8:51 AM  Between 7am to 6pm - Pager - [phone]  After 6pm go to [email or web address] - password [email or web address] [facility]  [facility] [place] [facility]  Office  [phone]  CC: Primary care physician; No PCP Per Patient  Note: This dictation was prepared with Dragon dictation along with smaller phrase technology. Any transcriptional errors that result from this process are unintentional.

## 2016-09-29 NOTE — Care Management Important Message (Signed)
Important Message  Patient Details  Name: Katherine Fields MRN: [id number] Date of Birth: 09-13-1965   Medicare Important Message Given:  Yes    Gwenette Greet, RN 09/29/2016, 1:59 PM

## 2016-09-29 NOTE — Progress Notes (Signed)
Nutrition Follow-up  DOCUMENTATION CODES:   Not applicable  INTERVENTION:  Mighty Shake II not needed to meet calorie/protein needs. Discontinued order for them to be delivered between meals.  Continue Magic cup BID with lunch and dinner, each supplement provides 290 kcal and 9 grams of protein.   NUTRITION DIAGNOSIS:   Swallowing difficulty related to dysphagia as evidenced by other (see comment) (per SLP recommendation of dysphagia 1 diet with nectar-thick liquids).  Ongoing.  GOAL:   Patient will meet greater than or equal to 90% of their needs  Met.  MONITOR:   PO intake, Supplement acceptance, Labs, Weight trends  REASON FOR ASSESSMENT:   Consult Assessment of nutrition requirement/status  ASSESSMENT:   51 yo female admitted with seizures. Pt with hx of ICH, stiffman syndrome, protein calorie malnutrition. Pt had G-tube but pt pulled out during the night 4/26.Pt is nonverbal  Able to communicate with patient through "yes" and "no" questions. Daughters not present at time of RD follow-up. She reports she is tolerating her diet. She has a good appetite. Denies any nausea or abdominal pain. Reports having regular bowel movements.   Per calorie counts patient regularly meeting calorie/protein needs. She met 99% calorie needs and 90% protein needs on 4/27 calorie count without oral nutrition supplements. Over the weekend she met >100% calorie and protein needs with oral nutrition supplements.  Medications reviewed and include: Keppra.  Labs reviewed and none pertinent.  No weights since initial assessment to trend.   Discussed with RN. If patient's daughters have any questions about  Diet Order:  DIET - DYS 1 Room service appropriate? Yes with Assist; Fluid consistency: Nectar Thick  Skin:  Reviewed, no issues  Last BM:  09/28/2016 - type 6 per chart  Height:   Ht Readings from Last 1 Encounters:  09/24/16 5' 6"  (1.676 m)    Weight:   Wt Readings from Last  1 Encounters:  09/25/16 120 lb 11.2 oz (54.7 kg)    Ideal Body Weight:     BMI:  Body mass index is 19.48 kg/m.  Estimated Nutritional Needs:   Kcal:  9450-3888 kcals  Protein:  70-85  Fluid:  >/= 1.4 L  EDUCATION NEEDS:   No education needs identified at this time  Willey Blade, MS, RD, LDN Pager: [phone] After Hours Pager: [phone]

## 2016-09-30 MED ORDER — LEVETIRACETAM 100 MG/ML PO SOLN: 1000.0000 mg | Freq: Two times a day (BID) | ORAL | 0 refills | Status: DC

## 2016-09-30 MED ORDER — ZONISAMIDE 100 MG PO CAPS: 100.0000 mg | ORAL_CAPSULE | Freq: Two times a day (BID) | ORAL | 0 refills | Status: DC

## 2016-09-30 MED ORDER — BACLOFEN 10 MG PO TABS: 5.0000 mg | ORAL_TABLET | Freq: Four times a day (QID) | ORAL | 0 refills | Status: DC

## 2016-09-30 MED ORDER — LORAZEPAM 2 MG/ML PO CONC: 0.6000 mg | Freq: Three times a day (TID) | ORAL | 0 refills | Status: DC | PRN

## 2016-09-30 NOTE — Progress Notes (Signed)
MD order received in [facility] to discharge pt to SNF today; Care Management previously prepared their part for discharge; telephone report called to [facility] [phone] spoke to [place], no questions voiced at this time

## 2016-09-30 NOTE — Clinical Social Work Note (Signed)
Patient to be d/c'ed today to [facility] SNF.  Patient and family agreeable to plans will transport via ems RN to call report.  Naama Sappington, MSW, LCSWA [phone]  

## 2016-09-30 NOTE — Progress Notes (Addendum)
  Speech Language Pathology Treatment: Dysphagia  Patient Details Name: Katherine Fields MRN: [id number] DOB: Feb 26, 1966 Today's Date: 09/30/2016 Time: 1015-1100 SLP Time Calculation (min) (ACUTE ONLY): 45 min  Assessment / Plan / Recommendation Clinical Impression  Pt seen for toleration of current dysphagia diet; po trials in hopes to upgrade liquid consistency to thin liquids. Pt awake, resting in bed. She used mostly nonverbal means to communicate(shook head Y/N) but did attempt phonations in response to some questions. NSG also reported she is tolerating the Dysphagia level 1 foods (puree) w/ Nectar liquids at this time. Pt helped to hold Cup to drink thin liquids w/ min support d/t overall weakness/stiff motor movements - pt appeared to initially tolerate trials of thin liquids via cup and straw (taking small, single sips) w/ no immediate, overt s/s of aspiration noted. However, after ~7-8 trials, pt appeared to exhibit min discoordination followed by multiple swallowing then an attempt at throat clearing/coughing(suspect pt's syndrome may be impacting her muscle movements/initiation in motor responses such as coughing which could impact her ability to help protect her airway). No decline in Pulmonary status appreciated during/post trials, but she did endorse the water trial went down the wrong way somewhat. After a rest break, she continue w/ a few more trials but then nodded she had had enough. Pt was given guidance and education on general aspiration precautions to include taking small, single sips SLOWLY. At end of session and after thorough discussion, pt communicated via head Y/N that she felt the Puree foods and Nectar liquids are easier for her to manage orally and pharyngeally for swallowing. She did want to have Sips of Water for PLEASURE b/t meals (post oral care) if ok per MD.    Recommend continue diet of Dysphagia level 1 w/ Nectar liquids; aspiration precautions; Pills in puree - crushed  as needed for easier, safer swallowing. Support at meals; monitoring for aspiration precautions. PLEASURE sips of Water in between meals PRN post thorough oral care; monitoring of such. NSG and MD updated; agreed.   HPI  see chart      SLP Plan  Continue with current plan of care; f/u by Dietician for nutritional support       Recommendations  Diet recommendations: Dysphagia 1 (puree);Nectar-thick liquid (w/ pleasure sips of thin liquids PRN post oral care) Liquids provided via: Cup;Straw Medication Administration: Crushed with puree Supervision: Staff to assist with self feeding;Full supervision/cueing for compensatory strategies Compensations: Minimize environmental distractions;Slow rate;Small sips/bites;Lingual sweep for clearance of pocketing;Multiple dry swallows after each bite/sip;Follow solids with liquid Postural Changes and/or Swallow Maneuvers: Seated upright 90 degrees;Upright 30-60 min after meal                General recommendations:  (Dietician f/u) Oral Care Recommendations: Oral care BID;Staff/trained caregiver to provide oral care Follow up Recommendations: Skilled Nursing facility SLP Visit Diagnosis: Dysphagia, oropharyngeal phase (R13.12) Plan: Continue with current plan of care       GO                Jerilynn Som, MS, CCC-SLP Girlie Veltri 09/30/2016, 2:05 PM

## 2016-09-30 NOTE — Plan of Care (Signed)
Problem: SLP Dysphagia Goals Goal: Misc Dysphagia Goal Pt will safely tolerate po diet of least restrictive consistency w/ no overt s/s of aspiration noted by Staff/pt/family x3 sessions.    

## 2016-09-30 NOTE — Discharge Summary (Addendum)
[facility] [place] [facility]   PATIENT NAME: Katherine Fields    MR#:  [id number]  DATE OF BIRTH:  1966-01-26  DATE OF ADMISSION:  09/24/2016   ADMITTING PHYSICIAN: Adrian Saran, MD  DATE OF DISCHARGE: 09/30/2016  PRIMARY CARE PHYSICIAN: Luvenia Starch PATRICIA, DO   ADMISSION DIAGNOSIS:  Seizure (HCC) [R56.9] Feeding by G-tube (HCC) [Z93.1] DISCHARGE DIAGNOSIS:  Active Problems:   Seizures (HCC)   Epilepsy, generalized, convulsive (HCC)   Dysphagia  SECONDARY DIAGNOSIS:   Past Medical History:  Diagnosis Date  . Chronic pain   . G tube feedings (HCC)   . ICH (intracerebral hemorrhage) (HCC)   . Protein calorie malnutrition (HCC)   . Stiffman syndrome    HOSPITAL COURSE:  51 y.o. female with a known history of Stiffman syndrome and seizure disorder admitted due to seizure andshe pulled out her NG tube during her seziure.  * Recurrent seizures This has been a chronic issue On Vimpat, Zonisamide, Keppra. Seizure precautions No further seizures in the hospital  * Dysphagia with PEG tube Patient pulled out her tube.Second instance Tolerating dysphagia diet I have Discussed with both daughters regarding not placing the PEG tube again due to high risk for pulling it out.  They both agrees. Patient meeting her nutritional needs even without insurance or mighty shake.   * Post ictal state has resolved  A PPD test was done to coordinate patient's transfer to assisted living facility if needed. It is negative  DISCHARGE CONDITIONS:  Fair CONSULTS OBTAINED:  Treatment Team:  Kym Groom, MD Pauletta Browns, MD DRUG ALLERGIES:   Allergies  Allergen Reactions  . Tramadol     Other reaction(s): Unknown   DISCHARGE MEDICATIONS:   Allergies as of 09/30/2016      Reactions   Tramadol    Other reaction(s): Unknown      Medication List    STOP taking these medications   diazepam 1 MG/ML solution Commonly known as:  VALIUM       TAKE these medications   baclofen 10 MG tablet Commonly known as:  LIORESAL Take 0.5 tablets (5 mg total) by mouth every 6 (six) hours. What changed:  how to take this   DULoxetine 30 MG capsule Commonly known as:  CYMBALTA Take 60 mg by mouth daily.   lacosamide 10 MG/ML oral solution Commonly known as:  VIMPAT Take 200 mg by mouth 2 (two) times daily.   levETIRAcetam 100 MG/ML solution Commonly known as:  KEPPRA Take 10 mLs (1,000 mg total) by mouth 2 (two) times daily. What changed:  how much to take   LORazepam 2 MG/ML concentrated solution Commonly known as:  ATIVAN Take 0.3 mLs (0.6 mg total) by mouth every 8 (eight) hours as needed for anxiety.   zonisamide 100 MG capsule Commonly known as:  ZONEGRAN Take 1 capsule (100 mg total) by mouth 2 (two) times daily. What changed:  medication strength  how much to take  how to take this  additional instructions        DISCHARGE INSTRUCTIONS:   DIET:  Dysphagia 1 with nectar thick consistency - Dysphagia level 1 w/ Nectar liquids; aspiration precautions; Pills in puree - crushed as needed for easier, safer swallowing. Support at meals; PLEASURE sips of WATER in between meals (PRN) post thorough oral care; Supervision w/ such. DISCHARGE CONDITION:  Fair ACTIVITY:  Activity as tolerated OXYGEN:  Home Oxygen: No.  Oxygen Delivery: room air DISCHARGE LOCATION:  Nursing home  If you experience worsening of your admission symptoms, develop shortness of breath, life threatening emergency, suicidal or homicidal thoughts you must seek medical attention immediately by calling 911 or calling your MD immediately  if symptoms less severe.  You Must read complete instructions/literature along with all the possible adverse reactions/side effects for all the Medicines you take and that have been prescribed to you. Take any new Medicines after you have completely understood and accpet all the possible adverse reactions/side  effects.   Please note  You were cared for by a hospitalist during your hospital stay. If you have any questions about your discharge medications or the care you received while you were in the hospital after you are discharged, you can call the unit and asked to speak with the hospitalist on call if the hospitalist that took care of you is not available. Once you are discharged, your primary care physician will handle any further medical issues. Please note that NO REFILLS for any discharge medications will be authorized once you are discharged, as it is imperative that you return to your primary care physician (or establish a relationship with a primary care physician if you do not have one) for your aftercare needs so that they can reassess your need for medications and monitor your lab values.    On the day of Discharge:  VITAL SIGNS:  Blood pressure 139/80, pulse 89, temperature 98.3 F (36.8 C), temperature source Oral, resp. rate 20, height  (1.676 m), weight 54.7 kg (120 lb 11.2 oz), SpO2 97 %. PHYSICAL EXAMINATION:  GENERAL:  51 y.o.-year-old patient lying in the bed with no acute distress.  EYES: Pupils equal, round, reactive to light and accommodation. No scleral icterus. Extraocular muscles intact.  HEENT: Head atraumatic, normocephalic. Oropharynx and nasopharynx clear.  NECK:  Supple, no jugular venous distention. No thyroid enlargement, no tenderness.  LUNGS: Normal breath sounds bilaterally, no wheezing, rales, rhonchi. No use of accessory muscles of respiration.  CARDIOVASCULAR: S1, S2 normal. No murmurs, rubs, or gallops.  ABDOMEN: Soft, non tender. PEG site has no bleeding EXTREMITIES: No cyanosis, clubbing or edema b/l.    NEUROLOGIC: Moves arms PSYCHIATRIC: The patient is alert and awake. Follows commands but non verbal DATA REVIEW:   CBC  Recent Labs Lab 09/26/16 0457  WBC 5.9  HGB 11.6*  HCT 34.5*  PLT 212    Chemistries   Recent Labs Lab  09/24/16 1216  09/26/16 0457  NA 141  < > 141  K 3.4*  < > 3.5  CL 103  < > 108  CO2 29  < > 26  GLUCOSE 81  < > 80  BUN 10  < > 15  CREATININE 0.81  < > 0.87  CALCIUM 9.8  < > 9.1  AST 30  --   --   ALT 16  --   --   ALKPHOS 119  --   --   BILITOT 0.5  --   --   < > = values in this interval not displayed.  Follow-up Information    SANTAYANA, GLORIA PATRICIA, DO. Schedule an appointment as soon as possible for a visit in 1 week(s).   Specialty:  Family Medicine Contact information: [address] [place] [address] [phone]        Alphonzo Lemmings, MD. Schedule an appointment as soon as possible for a visit in 2 week(s).   Specialty:  Neurology Contact information: [address] [facility]  [place] [address] [phone]           Management plans discussed with the patient, family and they are in agreement.  CODE STATUS: Full Code   TOTAL TIME TAKING CARE OF THIS PATIENT: 45 minutes.    Delfino Lovett M.D on 09/30/2016 at 1:37 PM  Between 7am to 6pm - Pager - [phone]  After 6pm go to [email or web address] - Social research officer, government  [facility]  Office  [phone]  CC: Primary care physician; Cyndie Mull, DO   Note: This dictation was prepared with Dragon dictation along with smaller phrase technology. Any transcriptional errors that result from this process are unintentional.

## 2016-09-30 NOTE — Progress Notes (Signed)
Physical Therapy Treatment Patient Details Name: Katherine Fields MRN: [id number] DOB: 02-27-1966 Today's Date: 09/30/2016    History of Present Illness Pt presented to ED with seizures and pt pulled out NG tube during seizures. Pt with hx of ICH, Stiffman syndrome, and gastrostomy per MD note.    PT Comments    Pt agreeable to PT; denies pain. Pt participate in supine bed exercises with assist as needed. Cues required for techniques/control. Pt initially agreeable to sitting edge of bed, but post exercises, too fatigued to attempt. Continue PT to progress endurance, strength to improve functional mobility and attempt up in bed/out of bed activity.    Follow Up Recommendations  SNF     Equipment Recommendations  None recommended by PT    Recommendations for Other Services       Precautions / Restrictions Precautions Precautions: Fall;Other (comment) (seizure history) Restrictions Weight Bearing Restrictions: No    Mobility  Bed Mobility               General bed mobility comments: Pt refused post exercise due to fatiue  Transfers                    Ambulation/Gait                 Stairs            Wheelchair Mobility    Modified Rankin (Stroke Patients Only)       Balance                                            Cognition Arousal/Alertness: Awake/alert Behavior During Therapy: Flat affect Overall Cognitive Status: No family/caregiver present to determine baseline cognitive functioning                                 General Comments: Non verbal; answers yes/no questions with head nod Follows instructions well      Exercises General Exercises - Lower Extremity Ankle Circles/Pumps: AROM;Both;20 reps;Supine Quad Sets: Strengthening;Both;20 reps;Supine Gluteal Sets: Strengthening;Both;Supine;10 reps (greaty difficulty; demonstrates more QS during) Short Arc Quad: AROM;Both;20 reps;Supine Heel  Slides: AAROM;Both;20 reps;Supine (assist for greater flexion range and control extension) Hip ABduction/ADduction: AAROM;AROM;Both;20 reps;Supine (assist for increased range and keeping leg elevated) Straight Leg Raises: AAROM;Both;10 reps;Supine    General Comments        Pertinent Vitals/Pain Pain Assessment: No/denies pain    Home Living                      Prior Function            PT Goals (current goals can now be found in the care plan section) Progress towards PT goals: Progressing toward goals (slowly)    Frequency    Min 2X/week      PT Plan Current plan remains appropriate    Co-evaluation              AM-PAC PT "6 Clicks" Daily Activity  Outcome Measure  Difficulty turning over in bed (including adjusting bedclothes, sheets and blankets)?: Total Difficulty moving from lying on back to sitting on the side of the bed? : Total Difficulty sitting down on and standing up from a chair with arms (e.g., wheelchair, bedside commode, etc,.)?: Total Help needed moving to  and from a bed to chair (including a wheelchair)?: Total Help needed walking in hospital room?: Total Help needed climbing 3-5 steps with a railing? : Total 6 Click Score: 6    End of Session   Activity Tolerance: Patient limited by fatigue Patient left: in bed;with call bell/phone within reach;with bed alarm set   PT Visit Diagnosis: Other abnormalities of gait and mobility (R26.89);Muscle weakness (generalized) (M62.81);Difficulty in walking, not elsewhere classified (R26.2);Repeated falls (R29.6);Other symptoms and signs involving the nervous system (R29.898)     Time: 9562-1308 PT Time Calculation (min) (ACUTE ONLY): 27 min  Charges:  $Therapeutic Exercise: 23-37 mins                    G Codes:        Scot Dock, PTA 09/30/2016, 12:00 PM

## 2016-09-30 NOTE — Progress Notes (Signed)
EMS contacted via telephone call for non emergency transport from Room 116 to [facility], no equipment needed

## 2017-02-14 IMAGING — CT CT HEAD W/O CM
4 series · 16 of 47 positions shown, 18 images · non-contrast
Comparison: October 31, 2011 and September 21, 2011

CLINICAL DATA: Pain following fall.  History of seizures

EXAM:
CT HEAD WITHOUT CONTRAST
TECHNIQUE: Contiguous axial images were obtained from the base of the skull
through the vertex without intravenous contrast.

[Series 2: head wo · axial · 0.39mm/px · z∈[+372,+462]mm · 7 of 26 slices shown, 9 images]
[im 4/26  brain]
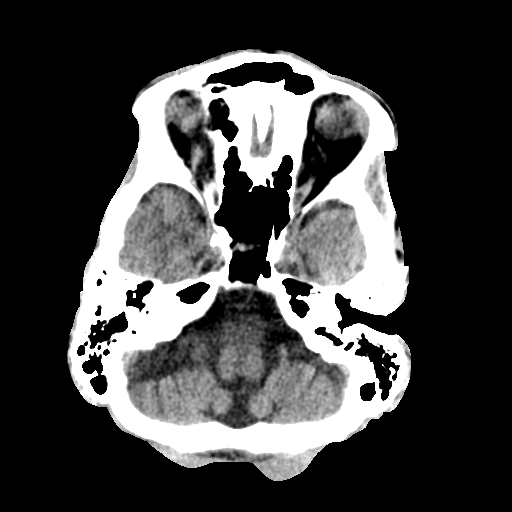
[im 4/26  bone]
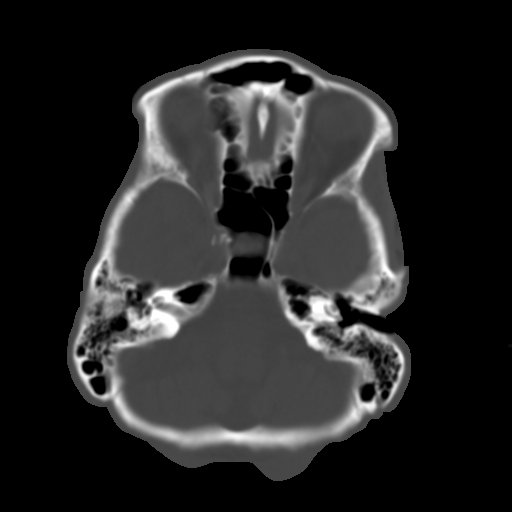
[im 7/26  brain]
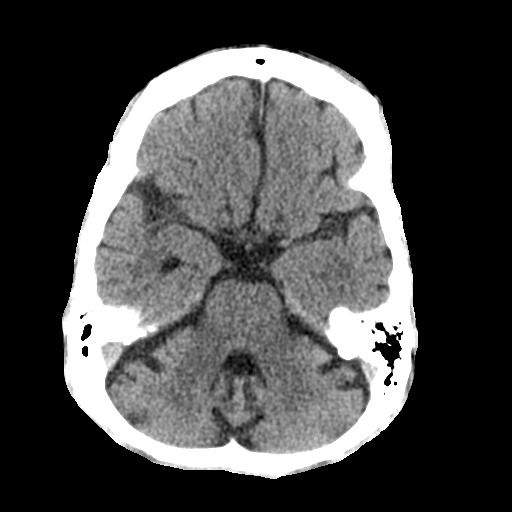
[im 10/26  brain]
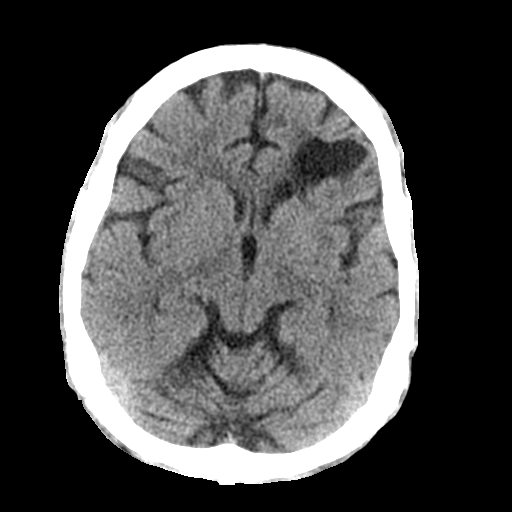
[im 13/26  brain]
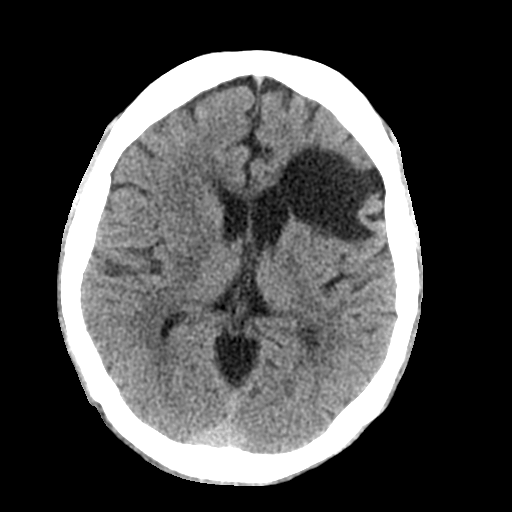
[im 16/26  brain]
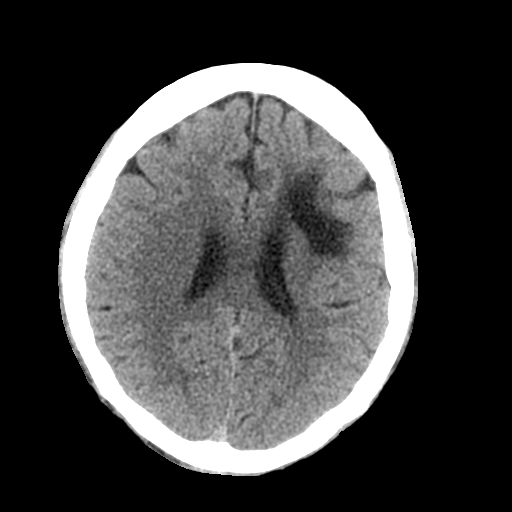
[im 16/26  bone]
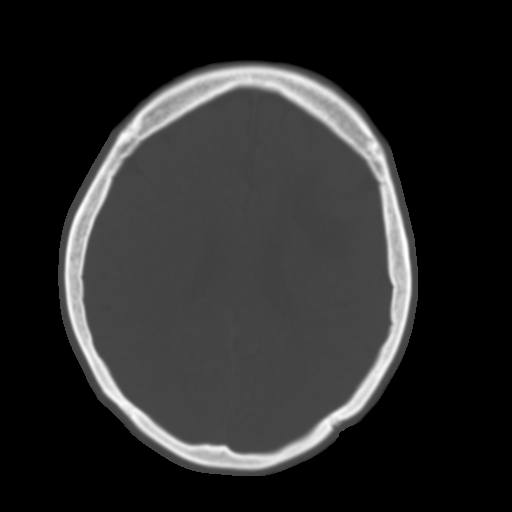
[im 19/26  brain]
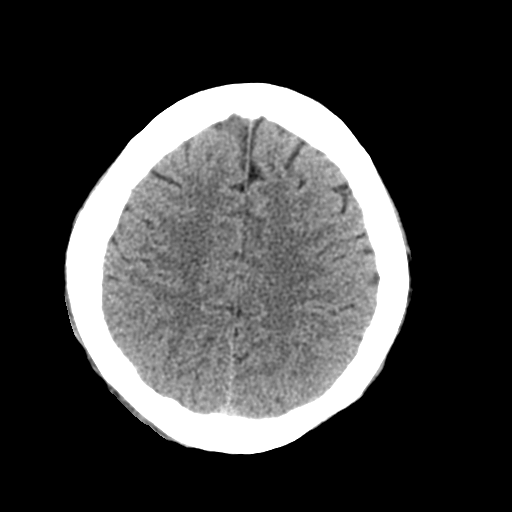
[im 22/26  brain]
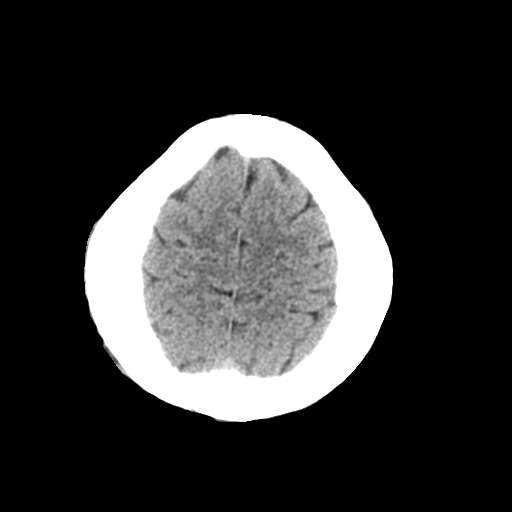

[Series 3: head bone · axial · 0.39mm/px · z∈[+369,+395]mm · 3 of 65 slices shown]
[im 7/65  bone]
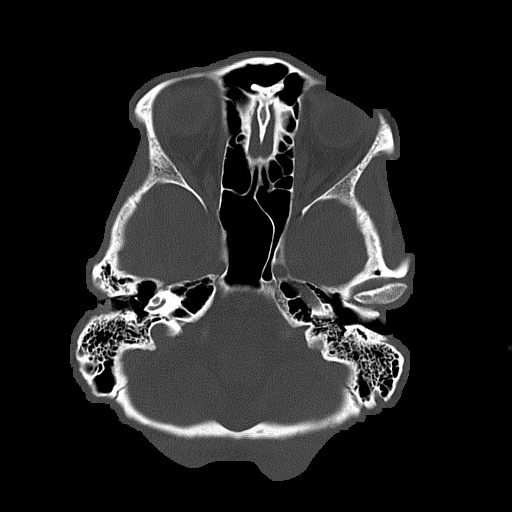
[im 13/65  bone]
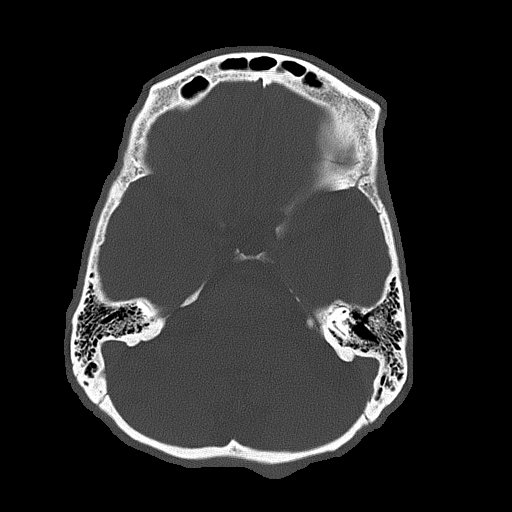
[im 20/65  bone]
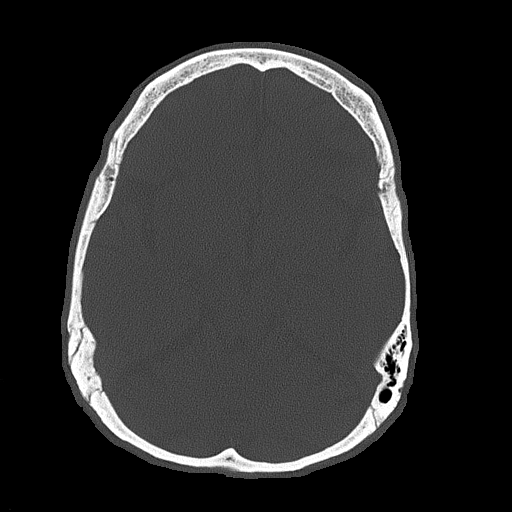

[Series 4: coronal soft tissue · coronal · 0.27mm/px · 3 of 59 slices shown]
[im 20/59  brain]
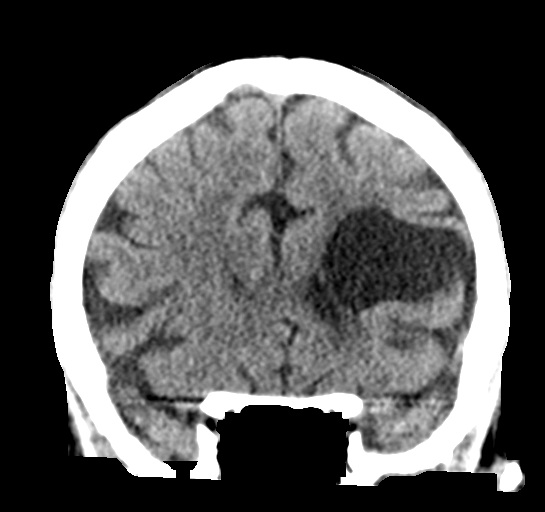
[im 26/59  brain]
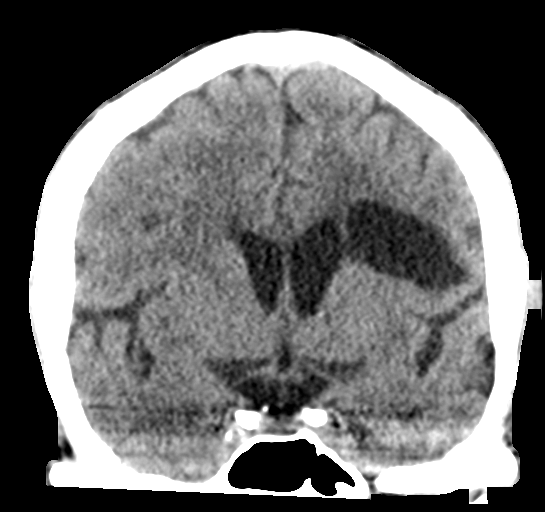
[im 33/59  brain]
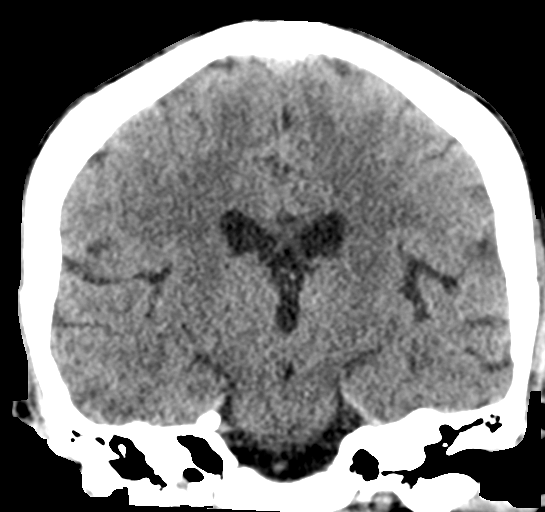

[Series 5: sagittal soft tissue · sagittal · 0.27mm/px · 3 of 50 slices shown]
[im 17/50  brain]
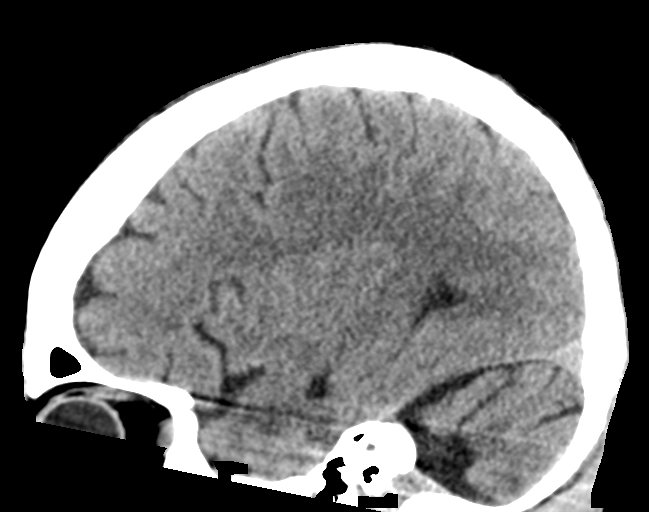
[im 25/50  brain]
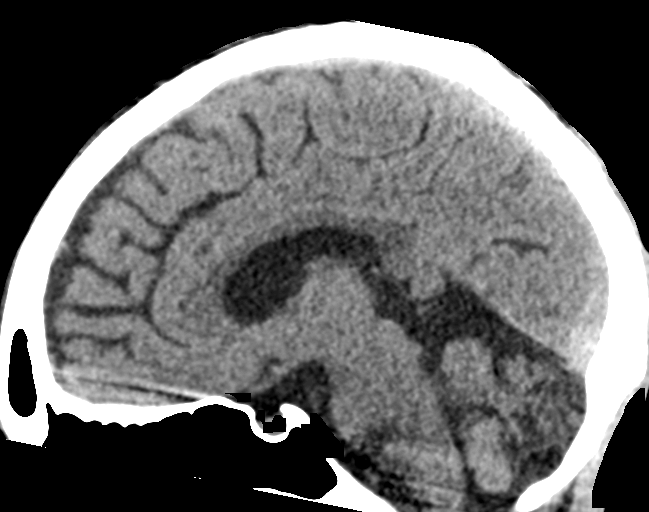
[im 33/50  brain]
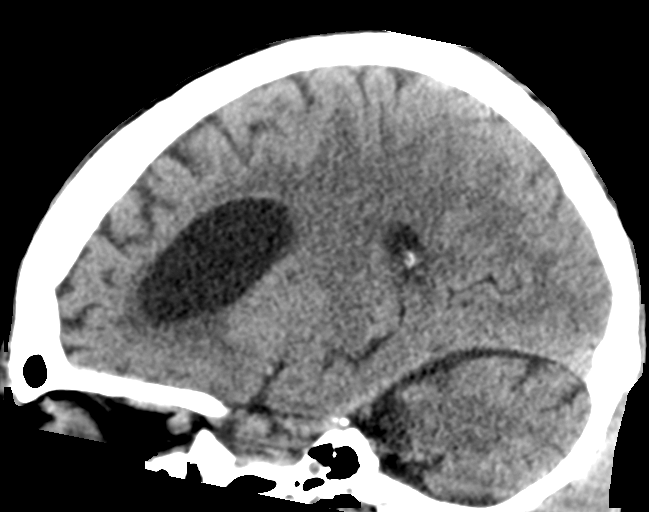

[16 of 47 positions shown; findings below may reference images not displayed]

FINDINGS: Brain: There is mild diffuse atrophy with ventricles and sulci are
more prominent than on prior study. There is uniformly decreased
attenuation in the mid left frontal lobe at the level of the mid
upper lateral ventricles, consistent with encephalomalacia from
previous hemorrhagic lesion noted in this area on prior studies from
1359. The previously noted vasogenic edema has resolved. No new
lesion is identified in this region. There is slight enlargement of
the frontal horn of the left lateral ventricle in this area due to
ex vacuo phenomenon. There is no mass effect. There is no acute
hemorrhage. There is no extra-axial fluid or midline shift. No acute
infarct is evident. There is a slight degree of small vessel disease
in right anterior centrum semiovale.

Vascular: There is no hyperdense vessel. There is calcification in
the each carotid siphon, more on the right than on the left.

Skull: The bony calvarium appears intact.

Sinuses/Orbits: Visualized paranasal sinuses are clear. Visualized
orbits appear symmetric bilaterally.

Other: Mastoid air cells are clear.
IMPRESSION: Compared to prior study, there is now mild generalized atrophy.
There is apparent encephalomalacia in the mid left frontal lobe.
There is no demonstrable acute hemorrhage, mass effect, edema, or
acute appearing infarct. There is slight small vessel disease in the
anterior right centrum semiovale.

There is carotid siphon calcification, more on the right than on the
left.

## 2017-06-20 ENCOUNTER — Encounter: Payer: Self-pay | Admitting: Emergency Medicine

## 2017-06-20 ENCOUNTER — Emergency Department: Payer: Medicaid Other

## 2017-06-20 ENCOUNTER — Emergency Department
Admission: EM | Admit: 2017-06-20 | Discharge: 2017-06-20 | Disposition: A | Discharge status: Home / Self Care | Payer: Medicaid Other | Attending: Emergency Medicine | Admitting: Emergency Medicine

## 2017-06-20 DIAGNOSIS — Z79899 Other long term (current) drug therapy: Secondary | ICD-10-CM | POA: Diagnosis not present

## 2017-06-20 DIAGNOSIS — G2582 Stiff-man syndrome: Secondary | ICD-10-CM | POA: Diagnosis not present

## 2017-06-20 DIAGNOSIS — R062 Wheezing: Secondary | ICD-10-CM | POA: Insufficient documentation

## 2017-06-20 DIAGNOSIS — R0682 Tachypnea, not elsewhere classified: Secondary | ICD-10-CM | POA: Insufficient documentation

## 2017-06-20 DIAGNOSIS — R0602 Shortness of breath: Secondary | ICD-10-CM | POA: Diagnosis present

## 2017-06-20 LAB — CBC
HEMATOCRIT: 41.1 % (ref 35.0–47.0)
Hemoglobin: 13.7 g/dL (ref 12.0–16.0)
MCH: 27.9 pg (ref 26.0–34.0)
MCHC: 33.3 g/dL (ref 32.0–36.0)
MCV: 84 fL (ref 80.0–100.0)
PLATELETS: 301 10*3/uL (ref 150–440)
RBC: 4.9 MIL/uL (ref 3.80–5.20)
RDW: 15.2 % — AB (ref 11.5–14.5)
WBC: 5.6 10*3/uL (ref 3.6–11.0)

## 2017-06-20 LAB — BASIC METABOLIC PANEL
Anion gap: 13 (ref 5–15)
BUN: 15 mg/dL (ref 6–20)
CHLORIDE: 101 mmol/L (ref 101–111)
CO2: 26 mmol/L (ref 22–32)
CREATININE: 0.89 mg/dL (ref 0.44–1.00)
Calcium: 10 mg/dL (ref 8.9–10.3)
GFR calc Af Amer: >60 mL/min (ref >60)
GFR calc non Af Amer: >60 mL/min (ref >60)
Glucose, Bld: 82 mg/dL (ref 65–99)
POTASSIUM: 3.4 mmol/L — AB (ref 3.5–5.1)
Sodium: 140 mmol/L (ref 135–145)

## 2017-06-20 LAB — TROPONIN I: Troponin I: <0.03 ng/mL (ref <0.03)

## 2017-06-20 MED ORDER — ALBUTEROL SULFATE (2.5 MG/3ML) 0.083% IN NEBU
2.5000 mg | INHALATION_SOLUTION | Freq: Once | RESPIRATORY_TRACT | Status: AC
  Administered 2017-06-20: 2.5 mg via RESPIRATORY_TRACT
  Filled 2017-06-20: qty 3

## 2017-06-20 MED ORDER — ALBUTEROL SULFATE (2.5 MG/3ML) 0.083% IN NEBU: 5.0000 mg | INHALATION_SOLUTION | Freq: Once | RESPIRATORY_TRACT | Status: DC

## 2017-06-20 NOTE — ED Notes (Signed)
Patient transported to X-ray 

## 2017-06-20 NOTE — ED Triage Notes (Signed)
Pt in via [facility] from [facility] with complaints of increasing shortness of breath today; pt with recent diagnoses of aspiration pneumonia.  Pt presents with grunting respirations, saturation 98% on room air.  Pt hypertensive upon arrival.  Pt nonverbal at baseline.  EDP to bedside.

## 2017-06-20 NOTE — ED Provider Notes (Addendum)
Workup and labs unrevealing to this point.  Chest x-ray and soft tissue neck are normal.  Lab work reassuring.  Stable hemodynamics.  She was noted to be quite tachypneic on vital sign checks, but respiratory therapy noted that she rest comfortably until the room is entered when she then begins to ground.  I observed her closely, and when not in the room and watching her respiratory rate is approximately 14-18 and she breathes quite comfortably.  When entering the room to speak to her she then begins lifting her shoulders and makes grunting noises and a respiratory rate increases to the 30s.  When out of the room she columns, has no evidence of distress.  No stridor or noisy breathing is heard.   At this point, I feel the patient is likely safe to return back to the care of her nursing facility.  No evidence of ongoing respiratory distress, but when approached she does make unusual grunting noise and I suspect this is likely not related to an acute physiologic cardiac/pulmonary etiology or upper airway etiology.   Sharyn CreamerQuale, Mark, MD 06/20/17 1651   Vitals:   06/20/17 1600 06/20/17 1700  BP: (!) 173/119 (!) 172/102  Pulse: 100 (!) 105  Resp: (!) 44 (!) 23  Temp:    SpO2: 98% 100%    Patient's family at bedside.  Discussed and reviewed today's results, plan to return to nursing home.  Family did not have any questions.  Patient resting comfortably, clear lung sounds no stridor, no evidence of respiratory distress.   Sharyn CreamerQuale, Mark, MD 06/20/17 [phone]

## 2017-06-20 NOTE — ED Provider Notes (Signed)
[facility] Emergency Department Provider Note  ____________________________________________   First MD Initiated Contact with Patient 06/20/17 1414     (approximate)  I have reviewed the triage vital signs and the nursing notes.   HISTORY  Chief Complaint Shortness of Breath   HPI Katherine Fields is a 52 y.o. 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  Patient per EMS was reported to have an already diagnosed aspiration pneumonia.  She is presenting to the emergency department for not being able to clear secretions and worsening respiratory status.  The patient is nonverbal and unable to discuss any complaints.    Past Medical History:  Diagnosis Date  . Chronic pain   . G tube feedings (HCC)   . ICH (intracerebral hemorrhage) (HCC)   . Protein calorie malnutrition (HCC)   . Stiffman syndrome     Patient Active Problem List   Diagnosis Date Noted  . Epilepsy, generalized, convulsive (HCC)   . Dysphagia   . Seizures (HCC) 09/24/2016  . Seizure (HCC) 03/14/2016    History reviewed. No pertinent surgical history.  Prior to Admission medications   Medication Sig Start Date End Date Taking? Authorizing Provider  baclofen (LIORESAL) 10 MG tablet Take 0.5 tablets (5 mg total) by mouth every 6 (six) hours. 09/30/16   Delfino LovettShah, Vipul, MD  DULoxetine (CYMBALTA) 30 MG capsule Take 60 mg by mouth daily.    [provider]  lacosamide (VIMPAT) 10 MG/ML oral solution Take 200 mg by mouth 2 (two) times daily.     [provider]  levETIRAcetam (KEPPRA) 100 MG/ML solution Take 10 mLs (1,000 mg total) by mouth 2 (two) times daily. 09/30/16   Delfino LovettShah, Vipul, MD  LORazepam (ATIVAN) 2 MG/ML concentrated solution Take 0.3 mLs (0.6 mg total) by mouth every 8 (eight) hours as needed for anxiety. 09/30/16   Delfino LovettShah, Vipul, MD  zonisamide (ZONEGRAN)  100 MG capsule Take 1 capsule (100 mg total) by mouth 2 (two) times daily. 09/30/16   Delfino LovettShah, Vipul, MD    Allergies Tramadol  Family History  Problem Relation Age of Onset  . Seizures Father     Social History Social History   Tobacco Use  . Smoking status: Never Smoker  . Smokeless tobacco: Never Used  Substance Use Topics  . Alcohol use: No  . Drug use: No    Review of Systems  Level 5 caveat secondary to patient nonverbal.   ____________________________________________   PHYSICAL EXAM:  VITAL SIGNS: ED Triage Vitals  Enc Vitals Group     BP 06/20/17 1405 (!) 169/121     Pulse Rate 06/20/17 1405 (!) 109     Resp 06/20/17 1405 (!) 44     Temp 06/20/17 1405 97.7 F (36.5 C)     Temp Source 06/20/17 1405 Oral     SpO2 06/20/17 1405 98 %     Weight 06/20/17 1411 121 lb 4.1 oz (55 kg)     Height 06/20/17 1411 5\' 4"  (1.626 m)     Head Circumference --      Peak Flow --      Pain Score --      Pain Loc --      Pain Edu? --      Excl. in GC? --     Constitutional: Alert.  No acute distress. Eyes: Conjunctivae are normal.  Head: Atraumatic. Nose: No congestion/rhinnorhea.  Mouth/Throat: Mucous membranes are moist.  Neck: No stridor.   Cardiovascular: Tachycardic, regular rhythm. Grossly normal heart sounds.   Respiratory: Tachypneic with left-sided rhonchi.  Gastrointestinal: Soft and nontender. No distention. Musculoskeletal: No lower extremity tenderness nor edema.  No joint effusions. Neurologic: Multiple contractures.  Nonverbal. Skin:  Skin is warm, dry and intact. No rash noted.   ____________________________________________   LABS (all labs ordered are listed, but only abnormal results are displayed)  Labs Reviewed  TROPONIN I  BASIC METABOLIC PANEL  CBC   ____________________________________________  EKG  ED ECG REPORT I, Arelia Longest, the attending physician, personally viewed and interpreted this ECG.   Date: 06/20/2017  EKG  Time: 1419  Rate: 100  Rhythm: sinus tachycardia  Axis: Normal  Intervals:none  ST&T Change: No ST segment elevation or depression.  No abnormal T wave inversion.  Biphasic T wave in V3.  ____________________________________________  RADIOLOGY  Chest x-ray without any acute pathology. ____________________________________________   PROCEDURES  Procedure(s) performed:   Procedures  Critical Care performed:   ____________________________________________   INITIAL IMPRESSION / ASSESSMENT AND PLAN / ED COURSE  Pertinent labs & imaging results that were available during my care of the patient were reviewed by me and considered in my medical decision making (see chart for details).  Differential includes, but is not limited to, viral syndrome, bronchitis including COPD exacerbation, pneumonia, reactive airway disease including asthma, CHF including exacerbation with or without pulmonary/interstitial edema, pneumothorax, ACS, thoracic trauma, and pulmonary embolism. As part of my medical decision making, I reviewed the following data within the electronic MEDICAL RECORD NUMBER Notes from prior ED visits  ----------------------------------------- 3:15PM on 06/20/2017 -----------------------------------------  Patient still with respiratory distress.  Called respiratory therapist to suction the patient.  Also ordered a soft tissue x-ray of the neck as well as albuterol treatment.  Pending labs at this time.  Signed out to Dr. Fanny Bien.      ____________________________________________   FINAL CLINICAL IMPRESSION(S) / ED DIAGNOSES  Tachypnea.  Sonorous respirations.    NEW MEDICATIONS STARTED DURING THIS VISIT:  New Prescriptions   No medications on file     Note:  This document was prepared using Dragon voice recognition software and may include unintentional dictation errors.     Myrna Blazer, MD 06/20/17 [phone]

## 2017-06-20 NOTE — ED Notes (Signed)
EDP to bedside to provide update to pt family.

## 2021-03-18 ENCOUNTER — Other Ambulatory Visit
Admission: RE | Admit: 2021-03-18 | Discharge: 2021-03-18 | Disposition: A | Discharge status: Home / Self Care | Payer: Medicaid Other | Source: Ambulatory Visit | Attending: Family Medicine | Admitting: Family Medicine

## 2021-03-18 DIAGNOSIS — R051 Acute cough: Secondary | ICD-10-CM | POA: Diagnosis not present

## 2021-03-18 LAB — CBC WITH DIFFERENTIAL/PLATELET
Abs Immature Granulocytes: 0.02 10*3/uL (ref 0.00–0.07)
Basophils Absolute: 0 10*3/uL (ref 0.0–0.1)
Basophils Relative: 0 %
Eosinophils Absolute: 0 10*3/uL (ref 0.0–0.5)
Eosinophils Relative: 0 %
HCT: 36.4 % (ref 36.0–46.0)
Hemoglobin: 12.5 g/dL (ref 12.0–15.0)
Immature Granulocytes: 1 %
Lymphocytes Relative: 20 %
Lymphs Abs: 0.9 10*3/uL (ref 0.7–4.0)
MCH: 31.4 pg (ref 26.0–34.0)
MCHC: 34.3 g/dL (ref 30.0–36.0)
MCV: 91.5 fL (ref 80.0–100.0)
Monocytes Absolute: 0.4 10*3/uL (ref 0.1–1.0)
Monocytes Relative: 9 %
Neutro Abs: 3.1 10*3/uL (ref 1.7–7.7)
Neutrophils Relative %: 70 %
Platelets: 252 10*3/uL (ref 150–400)
RBC: 3.98 MIL/uL (ref 3.87–5.11)
RDW: 13.2 % (ref 11.5–15.5)
WBC: 4.4 10*3/uL (ref 4.0–10.5)
nRBC: 0 % (ref 0.0–0.2)

## 2021-03-18 LAB — BASIC METABOLIC PANEL
Anion gap: 10 (ref 5–15)
BUN: 25 mg/dL — ABNORMAL HIGH (ref 6–20)
CO2: 29 mmol/L (ref 22–32)
Calcium: 9.9 mg/dL (ref 8.9–10.3)
Chloride: 103 mmol/L (ref 98–111)
Creatinine, Ser: 1.07 mg/dL — ABNORMAL HIGH (ref 0.44–1.00)
GFR, Estimated: >60 mL/min (ref >60)
Glucose, Bld: 107 mg/dL — ABNORMAL HIGH (ref 70–99)
Potassium: 3.6 mmol/L (ref 3.5–5.1)
Sodium: 142 mmol/L (ref 135–145)

## 2021-07-18 ENCOUNTER — Emergency Department: Payer: Medicaid Other

## 2021-07-18 ENCOUNTER — Other Ambulatory Visit: Payer: Self-pay

## 2021-07-18 ENCOUNTER — Emergency Department
Admission: EM | Admit: 2021-07-18 | Discharge: 2021-07-18 | Disposition: A | Discharge status: Home / Self Care | Payer: Medicaid Other | Attending: Emergency Medicine | Admitting: Emergency Medicine

## 2021-07-18 DIAGNOSIS — R1110 Vomiting, unspecified: Secondary | ICD-10-CM | POA: Insufficient documentation

## 2021-07-18 DIAGNOSIS — J690 Pneumonitis due to inhalation of food and vomit: Secondary | ICD-10-CM | POA: Insufficient documentation

## 2021-07-18 LAB — CBC
HCT: 37.6 % (ref 36.0–46.0)
Hemoglobin: 12.4 g/dL (ref 12.0–15.0)
MCH: 31.2 pg (ref 26.0–34.0)
MCHC: 33 g/dL (ref 30.0–36.0)
MCV: 94.5 fL (ref 80.0–100.0)
Platelets: 259 10*3/uL (ref 150–400)
RBC: 3.98 MIL/uL (ref 3.87–5.11)
RDW: 14.4 % (ref 11.5–15.5)
WBC: 6.7 10*3/uL (ref 4.0–10.5)
nRBC: 0 % (ref 0.0–0.2)

## 2021-07-18 LAB — COMPREHENSIVE METABOLIC PANEL
ALT: 10 U/L (ref 0–44)
AST: 12 U/L — ABNORMAL LOW (ref 15–41)
Albumin: 3.6 g/dL (ref 3.5–5.0)
Alkaline Phosphatase: 87 U/L (ref 38–126)
Anion gap: 6 (ref 5–15)
BUN: 24 mg/dL — ABNORMAL HIGH (ref 6–20)
CO2: 27 mmol/L (ref 22–32)
Calcium: 9.5 mg/dL (ref 8.9–10.3)
Chloride: 108 mmol/L (ref 98–111)
Creatinine, Ser: 1.05 mg/dL — ABNORMAL HIGH (ref 0.44–1.00)
GFR, Estimated: >60 mL/min (ref >60)
Glucose, Bld: 97 mg/dL (ref 70–99)
Potassium: 3.5 mmol/L (ref 3.5–5.1)
Sodium: 141 mmol/L (ref 135–145)
Total Bilirubin: 0.5 mg/dL (ref 0.3–1.2)
Total Protein: 7.2 g/dL (ref 6.5–8.1)

## 2021-07-18 LAB — LIPASE, BLOOD: Lipase: 34 U/L (ref 11–51)

## 2021-07-18 NOTE — ED Notes (Signed)
No answer when called several times from lobby; no answer when phone # listed in chart called 

## 2021-07-18 NOTE — ED Notes (Signed)
No answer when called several times from lobby 

## 2021-07-18 NOTE — ED Triage Notes (Signed)
Pt comes into the ED via [facility] from [facility] c/o aspiration.  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  Pt had a vomiting episode at lunch today and now is having "clearing episodes of the throat".  Pt is also altered from her baseline per the family.    Lung sounds clear 100% RA 137/83 96 CBG 93 HR 44  CO2

## 2021-07-18 NOTE — ED Triage Notes (Signed)
Pt comes via [facility] with c/o possible aspiration. Pt was eating and then vomited per staff at facility. Staff also reports pt states belly pain and trouble clearing her throat.

## 2021-07-18 NOTE — ED Notes (Addendum)
No answer in lobby for repeat vitals  

## 2022-05-09 ENCOUNTER — Emergency Department: Payer: Medicaid Other

## 2022-05-09 ENCOUNTER — Emergency Department
Admission: EM | Admit: 2022-05-09 | Discharge: 2022-05-10 | DRG: 640 | Discharge status: Against Medical Advice | Payer: Medicaid Other | Source: Skilled Nursing Facility | Attending: Internal Medicine | Admitting: Internal Medicine

## 2022-05-09 ENCOUNTER — Other Ambulatory Visit: Payer: Self-pay

## 2022-05-09 DIAGNOSIS — G2582 Stiff-man syndrome: Secondary | ICD-10-CM | POA: Diagnosis not present

## 2022-05-09 DIAGNOSIS — Z79899 Other long term (current) drug therapy: Secondary | ICD-10-CM

## 2022-05-09 DIAGNOSIS — G40919 Epilepsy, unspecified, intractable, without status epilepticus: Secondary | ICD-10-CM | POA: Diagnosis not present

## 2022-05-09 DIAGNOSIS — E162 Hypoglycemia, unspecified: Secondary | ICD-10-CM | POA: Diagnosis present

## 2022-05-09 DIAGNOSIS — I69318 Other symptoms and signs involving cognitive functions following cerebral infarction: Secondary | ICD-10-CM | POA: Diagnosis not present

## 2022-05-09 DIAGNOSIS — Z1152 Encounter for screening for COVID-19: Secondary | ICD-10-CM | POA: Diagnosis not present

## 2022-05-09 DIAGNOSIS — B9620 Unspecified Escherichia coli [E. coli] as the cause of diseases classified elsewhere: Secondary | ICD-10-CM | POA: Diagnosis present

## 2022-05-09 DIAGNOSIS — E872 Acidosis, unspecified: Secondary | ICD-10-CM | POA: Diagnosis not present

## 2022-05-09 DIAGNOSIS — Z66 Do not resuscitate: Secondary | ICD-10-CM | POA: Diagnosis not present

## 2022-05-09 DIAGNOSIS — G40909 Epilepsy, unspecified, not intractable, without status epilepticus: Secondary | ICD-10-CM | POA: Diagnosis not present

## 2022-05-09 DIAGNOSIS — R532 Functional quadriplegia: Secondary | ICD-10-CM | POA: Diagnosis present

## 2022-05-09 DIAGNOSIS — I69391 Dysphagia following cerebral infarction: Secondary | ICD-10-CM | POA: Diagnosis not present

## 2022-05-09 DIAGNOSIS — R319 Hematuria, unspecified: Secondary | ICD-10-CM | POA: Diagnosis present

## 2022-05-09 DIAGNOSIS — G8929 Other chronic pain: Secondary | ICD-10-CM | POA: Diagnosis present

## 2022-05-09 DIAGNOSIS — Z885 Allergy status to narcotic agent status: Secondary | ICD-10-CM | POA: Diagnosis not present

## 2022-05-09 DIAGNOSIS — N390 Urinary tract infection, site not specified: Secondary | ICD-10-CM | POA: Diagnosis not present

## 2022-05-09 DIAGNOSIS — R1310 Dysphagia, unspecified: Secondary | ICD-10-CM

## 2022-05-09 DIAGNOSIS — I10 Essential (primary) hypertension: Secondary | ICD-10-CM | POA: Diagnosis not present

## 2022-05-09 DIAGNOSIS — R569 Unspecified convulsions: Secondary | ICD-10-CM

## 2022-05-09 LAB — URINALYSIS, ROUTINE W REFLEX MICROSCOPIC
Bilirubin Urine: NEGATIVE
Glucose, UA: 150 mg/dL — AB
Hgb urine dipstick: NEGATIVE
Ketones, ur: NEGATIVE mg/dL
Nitrite: POSITIVE — AB
Protein, ur: 100 mg/dL — AB
Specific Gravity, Urine: 1.021 (ref 1.005–1.030)
WBC, UA: >50 WBC/hpf — ABNORMAL HIGH (ref 0–5)
pH: 5 (ref 5.0–8.0)

## 2022-05-09 LAB — CBC WITH DIFFERENTIAL/PLATELET
Abs Immature Granulocytes: 0.03 10*3/uL (ref 0.00–0.07)
Basophils Absolute: 0 10*3/uL (ref 0.0–0.1)
Basophils Relative: 0 %
Eosinophils Absolute: 0 10*3/uL (ref 0.0–0.5)
Eosinophils Relative: 0 %
HCT: 37 % (ref 36.0–46.0)
Hemoglobin: 12 g/dL (ref 12.0–15.0)
Immature Granulocytes: 1 %
Lymphocytes Relative: 11 %
Lymphs Abs: 0.7 10*3/uL (ref 0.7–4.0)
MCH: 31.6 pg (ref 26.0–34.0)
MCHC: 32.4 g/dL (ref 30.0–36.0)
MCV: 97.4 fL (ref 80.0–100.0)
Monocytes Absolute: 0.3 10*3/uL (ref 0.1–1.0)
Monocytes Relative: 6 %
Neutro Abs: 4.7 10*3/uL (ref 1.7–7.7)
Neutrophils Relative %: 82 %
Platelets: 279 10*3/uL (ref 150–400)
RBC: 3.8 MIL/uL — ABNORMAL LOW (ref 3.87–5.11)
RDW: 17.1 % — ABNORMAL HIGH (ref 11.5–15.5)
WBC: 5.7 10*3/uL (ref 4.0–10.5)
nRBC: 0 % (ref 0.0–0.2)

## 2022-05-09 LAB — CBG MONITORING, ED
Glucose-Capillary: 186 mg/dL — ABNORMAL HIGH (ref 70–99)
Glucose-Capillary: 66 mg/dL — ABNORMAL LOW (ref 70–99)
Glucose-Capillary: 41 mg/dL — CL (ref 70–99)
Glucose-Capillary: 90 mg/dL (ref 70–99)
Glucose-Capillary: 111 mg/dL — ABNORMAL HIGH (ref 70–99)
Glucose-Capillary: 29 mg/dL — CL (ref 70–99)
Glucose-Capillary: 91 mg/dL (ref 70–99)
Glucose-Capillary: 88 mg/dL (ref 70–99)
Glucose-Capillary: 108 mg/dL — ABNORMAL HIGH (ref 70–99)
Glucose-Capillary: 96 mg/dL (ref 70–99)
Glucose-Capillary: 88 mg/dL (ref 70–99)

## 2022-05-09 LAB — RESP PANEL BY RT-PCR (FLU A&B, COVID) ARPGX2
Influenza A by PCR: NEGATIVE
Influenza B by PCR: NEGATIVE
SARS Coronavirus 2 by RT PCR: NEGATIVE

## 2022-05-09 LAB — COMPREHENSIVE METABOLIC PANEL
ALT: 20 U/L (ref 0–44)
AST: 36 U/L (ref 15–41)
Albumin: 4.2 g/dL (ref 3.5–5.0)
Alkaline Phosphatase: 83 U/L (ref 38–126)
Anion gap: 6 (ref 5–15)
BUN: 31 mg/dL — ABNORMAL HIGH (ref 6–20)
CO2: 27 mmol/L (ref 22–32)
Calcium: 9.9 mg/dL (ref 8.9–10.3)
Chloride: 110 mmol/L (ref 98–111)
Creatinine, Ser: 1.09 mg/dL — ABNORMAL HIGH (ref 0.44–1.00)
GFR, Estimated: 60 mL/min — ABNORMAL LOW (ref >60)
Glucose, Bld: 78 mg/dL (ref 70–99)
Potassium: 4.9 mmol/L (ref 3.5–5.1)
Sodium: 143 mmol/L (ref 135–145)
Total Bilirubin: 0.6 mg/dL (ref 0.3–1.2)
Total Protein: 8.2 g/dL — ABNORMAL HIGH (ref 6.5–8.1)

## 2022-05-09 LAB — LACTIC ACID, PLASMA: Lactic Acid, Venous: 2.7 mmol/L (ref 0.5–1.9)

## 2022-05-09 LAB — BRAIN NATRIURETIC PEPTIDE: B Natriuretic Peptide: 77.4 pg/mL (ref 0.0–100.0)

## 2022-05-09 MED ORDER — LORAZEPAM 0.5 MG PO TABS: 0.5000 mg | ORAL_TABLET | Freq: Four times a day (QID) | ORAL | Status: DC | PRN

## 2022-05-09 MED ORDER — PANTOPRAZOLE SODIUM 40 MG PO TBEC
40.0000 mg | DELAYED_RELEASE_TABLET | Freq: Two times a day (BID) | ORAL | Status: DC
  Administered 2022-05-09 (×2): 40 mg via ORAL
  Filled 2022-05-09 (×2): qty 1

## 2022-05-09 MED ORDER — SODIUM CHLORIDE 0.9 % IV SOLN
100.0000 mg | Freq: Once | INTRAVENOUS | Status: AC
  Administered 2022-05-09: 100 mg via INTRAVENOUS
  Filled 2022-05-09: qty 10

## 2022-05-09 MED ORDER — DULOXETINE HCL 20 MG PO CPEP
40.0000 mg | ORAL_CAPSULE | Freq: Two times a day (BID) | ORAL | Status: DC
  Administered 2022-05-09 (×2): 40 mg via ORAL
  Filled 2022-05-09 (×2): qty 2

## 2022-05-09 MED ORDER — LORAZEPAM 2 MG/ML IJ SOLN: 1.0000 mg | Freq: Once | INTRAMUSCULAR | Status: DC

## 2022-05-09 MED ORDER — BACLOFEN 10 MG PO TABS
5.0000 mg | ORAL_TABLET | Freq: Four times a day (QID) | ORAL | Status: DC
  Administered 2022-05-09 – 2022-05-10 (×3): 5 mg via ORAL
  Filled 2022-05-09 (×3): qty 1

## 2022-05-09 MED ORDER — ONDANSETRON HCL 4 MG PO TABS: 4.0000 mg | ORAL_TABLET | Freq: Four times a day (QID) | ORAL | Status: DC | PRN

## 2022-05-09 MED ORDER — DEXTROSE 10 % IV SOLN: INTRAVENOUS | Status: DC

## 2022-05-09 MED ORDER — BISACODYL 10 MG RE SUPP: 10.0000 mg | RECTAL | Status: DC | PRN

## 2022-05-09 MED ORDER — ENOXAPARIN SODIUM 40 MG/0.4ML IJ SOSY
40.0000 mg | PREFILLED_SYRINGE | INTRAMUSCULAR | Status: DC
  Administered 2022-05-09: 40 mg via SUBCUTANEOUS
  Filled 2022-05-09: qty 0.4

## 2022-05-09 MED ORDER — SODIUM CHLORIDE 0.9 % IV SOLN
1.0000 g | Freq: Once | INTRAVENOUS | Status: AC
  Administered 2022-05-09: 1 g via INTRAVENOUS
  Filled 2022-05-09: qty 10

## 2022-05-09 MED ORDER — ZONISAMIDE 100 MG PO CAPS
100.0000 mg | ORAL_CAPSULE | Freq: Two times a day (BID) | ORAL | Status: DC
  Administered 2022-05-09 (×2): 100 mg via ORAL
  Filled 2022-05-09 (×3): qty 1

## 2022-05-09 MED ORDER — DEXTROSE 50 % IV SOLN
1.0000 | Freq: Once | INTRAVENOUS | Status: AC
  Administered 2022-05-10: 50 mL via INTRAVENOUS
  Filled 2022-05-09: qty 50

## 2022-05-09 MED ORDER — LEVETIRACETAM 100 MG/ML PO SOLN
1000.0000 mg | Freq: Two times a day (BID) | ORAL | Status: DC
  Administered 2022-05-09 – 2022-05-10 (×2): 1000 mg via ORAL
  Filled 2022-05-09 (×3): qty 10

## 2022-05-09 MED ORDER — DEXTROSE 50 % IV SOLN
INTRAVENOUS | Status: AC
  Administered 2022-05-09: 50 mL via INTRAVENOUS
  Filled 2022-05-09: qty 50

## 2022-05-09 MED ORDER — LORAZEPAM 2 MG/ML IJ SOLN: 1.0000 mg | Freq: Once | INTRAMUSCULAR | Status: AC

## 2022-05-09 MED ORDER — LORAZEPAM 2 MG/ML IJ SOLN
1.0000 mg | Freq: Once | INTRAMUSCULAR | Status: AC
  Administered 2022-05-09: 1 mg via INTRAVENOUS

## 2022-05-09 MED ORDER — SODIUM CHLORIDE 0.9 % IV BOLUS
1000.0000 mL | Freq: Once | INTRAVENOUS | Status: AC
  Administered 2022-05-09: 1000 mL via INTRAVENOUS

## 2022-05-09 MED ORDER — METOPROLOL TARTRATE 25 MG PO TABS
25.0000 mg | ORAL_TABLET | Freq: Two times a day (BID) | ORAL | Status: DC
  Administered 2022-05-09 (×2): 25 mg via ORAL
  Filled 2022-05-09 (×2): qty 1

## 2022-05-09 MED ORDER — SODIUM CHLORIDE 0.9 % IV SOLN: 1.0000 g | Freq: Once | INTRAVENOUS | Status: DC

## 2022-05-09 MED ORDER — LACOSAMIDE 50 MG PO TABS
200.0000 mg | ORAL_TABLET | Freq: Two times a day (BID) | ORAL | Status: DC
  Administered 2022-05-09 (×2): 200 mg via ORAL
  Filled 2022-05-09 (×2): qty 4

## 2022-05-09 MED ORDER — LACTULOSE 10 GM/15ML PO SOLN
20.0000 g | Freq: Two times a day (BID) | ORAL | Status: DC
  Administered 2022-05-09 (×2): 20 g via ORAL
  Filled 2022-05-09 (×2): qty 30

## 2022-05-09 MED ORDER — LEVETIRACETAM IN NACL 1000 MG/100ML IV SOLN
1000.0000 mg | Freq: Once | INTRAVENOUS | Status: AC
  Administered 2022-05-09: 1000 mg via INTRAVENOUS
  Filled 2022-05-09: qty 100

## 2022-05-09 MED ORDER — DEXTROSE 50 % IV SOLN
1.0000 | Freq: Once | INTRAVENOUS | Status: AC
  Administered 2022-05-09: 50 mL via INTRAVENOUS
  Filled 2022-05-09: qty 50

## 2022-05-09 MED ORDER — ATORVASTATIN CALCIUM 20 MG PO TABS
40.0000 mg | ORAL_TABLET | Freq: Every day | ORAL | Status: DC
  Administered 2022-05-09: 40 mg via ORAL
  Filled 2022-05-09: qty 2

## 2022-05-09 MED ORDER — ONDANSETRON HCL 4 MG/2ML IJ SOLN: 4.0000 mg | Freq: Four times a day (QID) | INTRAMUSCULAR | Status: DC | PRN

## 2022-05-09 MED ORDER — LISINOPRIL 10 MG PO TABS
20.0000 mg | ORAL_TABLET | Freq: Every day | ORAL | Status: DC
  Administered 2022-05-09: 20 mg via ORAL
  Filled 2022-05-09: qty 2

## 2022-05-09 MED ORDER — DEXTROSE 50 % IV SOLN: 1.0000 | Freq: Once | INTRAVENOUS | Status: AC

## 2022-05-09 MED ORDER — SENNA 8.6 MG PO TABS
2.0000 | ORAL_TABLET | Freq: Two times a day (BID) | ORAL | Status: DC
  Administered 2022-05-09 (×2): 17.2 mg via ORAL
  Filled 2022-05-09 (×2): qty 2

## 2022-05-09 MED ORDER — OXYCODONE HCL 5 MG PO TABS
5.0000 mg | ORAL_TABLET | ORAL | Status: DC | PRN
  Administered 2022-05-09: 5 mg via ORAL
  Filled 2022-05-09: qty 1

## 2022-05-09 MED ORDER — LORAZEPAM 2 MG/ML IJ SOLN
INTRAMUSCULAR | Status: AC
  Administered 2022-05-09: 1 mg via INTRAVENOUS
  Filled 2022-05-09: qty 1

## 2022-05-09 MED ORDER — DEXTROSE 50 % IV SOLN
1.0000 | Freq: Once | INTRAVENOUS | Status: AC
  Administered 2022-05-09: 50 mL via INTRAVENOUS

## 2022-05-09 NOTE — ED Notes (Signed)
Repeat blood sugar 111

## 2022-05-09 NOTE — Consult Note (Signed)
Neurology Consultation Reason for Consult: Breakthrough seizure Referring Physician: Agbata, T  CC: Breakthrough seizure  History is obtained from: Chart review  HPI: Katherine Fields is a 56 y.o. 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   Breakthrough is in the setting of her urinary tract infection, and this could cause some worsening of her underlying symptoms as well.  Has had improvement with muscle tone with IVIG in the past, though I will hold off on doing this until her UTI is treated if she is improving with that    Past Medical History:  Diagnosis Date   Chronic pain    G tube feedings (HCC)    ICH (intracerebral hemorrhage) (HCC)    Protein calorie malnutrition (HCC)    Stiffman syndrome      Family History  Problem Relation Age of Onset   Seizures Father      Social History:  reports that she has never smoked. She has never used smokeless tobacco. She reports that she does not drink alcohol and does not use drugs.   Exam: Current vital signs: BP (!) 163/104 (BP Location: Right Arm)   Pulse 96   Temp 99.1 F (37.3 C) (Oral)   Resp (!) 22   Ht 5\' 6"  (1.676 m)   Wt 54.4 kg   LMP  (LMP Unknown)   SpO2 100%   BMI 19.37 kg/m  Vital signs in last 24 hours: Temp:  [98.6 F (37 C)-99.1 F (37.3 C)] 99.1 F (37.3 C) (12/08 1232) Pulse Rate:  [81-98] 96 (12/08 1232) Resp:  [17-24] 22 (12/08 1232) BP: (146-172)/(85-113) 163/104 (12/08 1232) SpO2:  [98 %-100 %] 100 % (12/08 1232) Weight:  [54.4 kg] 54.4 kg (12/08 0927)   Physical Exam  Constitutional: Appears well-developed and well-nourished.  Psych: Affect appropriate to situation Eyes: No scleral injection HENT: No OP obstruction MSK: no joint  deformities.  Cardiovascular: Normal rate and regular rhythm.  Respiratory: Effort normal, non-labored breathing GI: Soft.  No distension. There is no tenderness.  Skin: WDI  Neuro: Mental Status: Patient is awake, he is able to tell me the month, her name, all of her answers are with a yell for Cranial Nerves: II: Visual Fields are full. Pupils are equal, round, and reactive to light.   III,IV, VI: Both directions, her eye movements were to pursue voluntary movement, but no definite unilateral deviation..  V: Facial sensation is symmetric to temperature VII: Facial movement is symmetric.  Motor: He has increased tone on the right, but is able to follow and is in all four extremities  sensory: Endorses symmetric sensation throughout  Cerebellar: Does not perform     I have reviewed labs in epic and the results pertinent to this consultation are: UA-UTI CMP is unremarkable  I have reviewed the images obtained: CT head with chronic left MCA territory infarct  Impression: 56 year old female with a neurodegenerative disorder, seizure disorder who presents with breakthrough seizures in the setting of UTI.  Given there is a persistent, I am hesitant to make changes to her AED regimen and would favor treatment of UTI  Recommendations: 1) continue home keppra 1 g twice daily 2) continue home zonisamide 100 mg twice daily 3) continue home Vimpat 200 mg twice daily 4) continue home  baclofen 5) EEG 6) tx uti per IM 7) neuro will follow  Ritta Slot, MD [facility] [phone]  If 7pm- 7am, please page neurology on call as listed in AMION.

## 2022-05-09 NOTE — ED Notes (Signed)
Pt ate a large portion of her meal with assistance

## 2022-05-09 NOTE — Assessment & Plan Note (Signed)
Unclear etiology Patient does not have a known history of diabetes mellitus and is not on insulin Continue D10 infusion as well as oral diet Patient needs assistance with meals Check blood sugars every 2 hours and if stable will DC D10 infusion

## 2022-05-09 NOTE — ED Notes (Signed)
Pt in bed, family at bedside, reviewed plan of care for pt to eat, dextrose 10% from ems is complete

## 2022-05-09 NOTE — ED Triage Notes (Signed)
Ems states "called out to [facility] for seizure. Facility states patient had a seizure around 0500 and was acting her normal self about 1 hr later. States around [id number], patient was normal and they fed her. CBG on scene was 33,  started 20g IV and hung 10% dextrose. She only got about in route. We rechecked BS and came up to 42. Hx of CVA and seizure. On arrival she had a gaze to the right and now she has a gaze to the left"

## 2022-05-09 NOTE — Assessment & Plan Note (Signed)
Continue baclofen 

## 2022-05-09 NOTE — Assessment & Plan Note (Signed)
Patient noted to have pyuria which could have triggered her breakthrough seizures Prior urine culture yielded E. Coli Treat patient empirically with Rocephin 1 g IV daily Follow-up results of urine culture

## 2022-05-09 NOTE — Progress Notes (Signed)
Eeg done 

## 2022-05-09 NOTE — ED Notes (Signed)
Family at bedside. 

## 2022-05-09 NOTE — ED Notes (Signed)
Pt in bed, iv in L ac seems to have infiltrated, called pharmacy, no interventions needed, d/c pt iv, cath intact, blood sugar 29, hospitalist paged, verbal order of an am of d50 from ed doc, iv started in R ac, amp given, pt awake   Oral food given.

## 2022-05-09 NOTE — Assessment & Plan Note (Signed)
Secondary to stroke Patient is on a pured diet and will require assistance with all meals

## 2022-05-09 NOTE — Assessment & Plan Note (Signed)
Continue lisinopril and metoprolol ?

## 2022-05-09 NOTE — ED Notes (Signed)
Pt ate 100% of meal tray, another iv at bedside for iv placement

## 2022-05-09 NOTE — H&P (Signed)
History and Physical    Patient: Katherine Fields DPO:[id number] DOB: 1965-07-01 DOA: 05/09/2022 DOS: the patient was seen and examined on 05/09/2022 PCP: Cyndie Mull, DO  Patient coming from: SNF  Chief Complaint:  Chief Complaint  Patient presents with   Seizures   Most of the history was obtained from patient's daughter as well as the EMR. HPI: Katherine Fields is a 56 y.o. 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  She had another episode which her daughter who also works at the nursing home witnessed and states that patient had a left gaze deviation with no response to verbal stimuli. EMS was called and when they arrived patient was noted to have a CBG of 33 and was started on 10% dextrose infusion.  She does not have a known history of diabetes mellitus and is not on insulin.  Blood sugar was rechecked and it was 42. During my evaluation she is awake and family states that she appears to be back to her baseline. Patient received 3 Amps of D50 in the ER for persistent hypoglycemia, loading dose of Keppra and Vimpat as well as a dose of Rocephin for UTI She will be admitted to the hospital for further evaluation.    Review of Systems: As mentioned in the history of present illness. All other systems reviewed and are negative. Past Medical History:  Diagnosis Date   Chronic pain    G tube feedings (HCC)    ICH (intracerebral hemorrhage) (HCC)    Protein calorie malnutrition (HCC)    Stiffman syndrome    History reviewed. No pertinent surgical history. Social History:  reports that she has never smoked. She has never used smokeless tobacco. She reports that she does not drink alcohol and does not use drugs.  Allergies  Allergen Reactions    Tramadol     Other reaction(s): Unknown    Family History  Problem Relation Age of Onset   Seizures Father     Prior to Admission medications   Medication Sig Start Date End Date Taking? Authorizing Provider  atorvastatin (LIPITOR) 40 MG tablet Take 40 mg by mouth daily.   Yes [provider]  baclofen (LIORESAL) 10 MG tablet Take 0.5 tablets (5 mg total) by mouth every 6 (six) hours. 09/30/16  Yes Delfino Lovett, MD  bisacodyl (DULCOLAX) 10 MG suppository Place 10 mg rectally as needed for moderate constipation.   Yes [provider]  DULoxetine (CYMBALTA) 30 MG capsule Take 40 mg by mouth 2 (two) times daily.   Yes [provider]  erythromycin ophthalmic ointment Place 1 Application into both eyes at bedtime. 04/01/22  Yes [provider]  lacosamide (VIMPAT) 200 MG TABS tablet Take 200 mg by mouth 2 (two) times daily.   Yes [provider]  lactulose (CHRONULAC) 10 GM/15ML solution Take 30 mLs by mouth 2 (two) times daily.   Yes [provider]  levETIRAcetam (KEPPRA) 100 MG/ML solution Take 10 mLs (1,000 mg total) by mouth 2 (two) times daily. 09/30/16  Yes Delfino Lovett, MD  lisinopril (ZESTRIL) 20 MG tablet Take 20 mg by mouth daily.   Yes [provider]  LORazepam (ATIVAN) 0.5 MG tablet Take 0.5 mg by mouth every 6 (six) hours as needed for anxiety.  Yes [provider]  metoprolol tartrate (LOPRESSOR) 25 MG tablet Take 25 mg by mouth 2 (two) times daily.   Yes [provider]  oxycodone (OXY-IR) 5 MG capsule Take 5 mg by mouth every 4 (four) hours as needed for pain.   Yes [provider]  pantoprazole (PROTONIX) 40 MG tablet Take 40 mg by mouth 2 (two) times daily. 05/08/22  Yes [provider]  senna (SENOKOT) 8.6 MG TABS tablet Take 2 tablets by mouth 2 (two) times daily.   Yes [provider]  trimethoprim-polymyxin b (POLYTRIM) ophthalmic solution Place 1 drop into both eyes  every 6 (six) hours. 04/01/22  Yes [provider]  zonisamide (ZONEGRAN) 100 MG capsule Take 1 capsule (100 mg total) by mouth 2 (two) times daily. 09/30/16  Yes Delfino Lovett, MD  hydrOXYzine (ATARAX) 25 MG tablet Take 25 mg by mouth 3 (three) times daily. Patient not taking: Reported on 05/09/2022 11/26/21   [provider]  lacosamide (VIMPAT) 10 MG/ML oral solution Take 200 mg by mouth 2 (two) times daily.  Patient not taking: Reported on 05/09/2022    [provider]  LORazepam (ATIVAN) 2 MG/ML concentrated solution Take 0.3 mLs (0.6 mg total) by mouth every 8 (eight) hours as needed for anxiety. 09/30/16   Delfino Lovett, MD  promethazine (PHENERGAN) 25 MG/ML injection Inject 25 mg into the muscle every 4 (four) hours as needed for vomiting or nausea.    [provider]    Physical Exam: Vitals:   05/09/22 1145 05/09/22 1200 05/09/22 1215 05/09/22 1232  BP: (!) 172/103 (!) 167/85 (!) 162/100 (!) 163/104  Pulse: 92 88 86 96  Resp: (!) 24 (!) 24 20 (!) 22  Temp:    99.1 F (37.3 C)  TempSrc:    Oral  SpO2: 99% 100% 100% 100%  Weight:      Height:       Physical Exam Vitals and nursing note reviewed.  Constitutional:      Comments: Chronically ill-appearing.  Responds to simple questions and commands  HENT:     Head: Normocephalic and atraumatic.     Nose: Nose normal.     Mouth/Throat:     Mouth: Mucous membranes are dry.  Eyes:     Conjunctiva/sclera: Conjunctivae normal.  Cardiovascular:     Rate and Rhythm: Normal rate and regular rhythm.  Pulmonary:     Effort: Pulmonary effort is normal.     Breath sounds: Normal breath sounds.  Abdominal:     General: Abdomen is flat. Bowel sounds are normal.     Palpations: Abdomen is soft.  Musculoskeletal:     Cervical back: Normal range of motion and neck supple.     Comments: Both feet are off the bed are stiff  Skin:    General: Skin is warm and dry.  Neurological:     Mental Status: She is  alert.     Comments: Unable to assess  Psychiatric:     Comments: Unable to assess     Data Reviewed: Relevant notes from primary care and specialist visits, past discharge summaries as available in EHR, including Care Everywhere. Prior diagnostic testing as pertinent to current admission diagnoses Updated medications and problem lists for reconciliation ED course, including vitals, labs, imaging, treatment and response to treatment Triage notes, nursing and pharmacy notes and ED provider's notes Notable results as noted in HPI Labs reviewed.  Lactic acid 2.7, BNP 77.4, sodium 143, potassium 4.9, chloride 110,  bicarb 27, glucose 78, BUN 31, creatinine 1.09, calcium 9.9, total protein 8.2, albumin 4.2, AST 36, ALT 20, alkaline phosphatase 83, total bilirubin 0.6, white count 5.7, hemoglobin 12.0, hematocrit 37, platelet count 279 Respiratory viral panel is negative CT scan of the head without contrast shows no acute intracranial abnormality. Chronic Left MCA infarct. And suspicion of chronic disproportionate cerebellar atrophy. Chest x-ray reviewed by me shows no evidence of acute cardiopulmonary disease Twelve-lead EKG reviewed by me shows sinus rhythm There are no new results to review at this time.  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 Coli Treat patient empirically with Rocephin 1 g IV daily Follow-up results of urine culture  Chronic pain Continue Cymbalta and oxycodone  Stiffman syndrome Continue baclofen  Dysphagia Secondary to stroke Patient is on a pured diet and will require assistance with all meals  Seizure (HCC) Patient with a known history of seizures who presents to the ER for evaluation of breakthrough seizures most likely related to acute UTI and hypoglycemia She was loaded with Keppra and Vimpat in the ER Continue scheduled Vimpat, Keppra and zonisamide Place patient on seizure precautions      Advance Care Planning:   Code Status: DNR   Consults: Neurology  Family Communication: Greater than 50% of time was spent discussing patient's condition and plan of care with her daughters at the bedside.  All questions and concerns have been addressed.  They verbalized understanding and agree with the plan.  CODE STATUS was discussed and she is a DO NOT RESUSCITATE  Severity of Illness: The appropriate patient status for this patient is INPATIENT. Inpatient status is judged to be reasonable and necessary in order to provide the required intensity of service to ensure the patient's safety. The patient's presenting symptoms, physical exam findings, and initial radiographic and laboratory data in the context of their chronic comorbidities is felt to place them at high risk for further clinical deterioration. Furthermore, it is not anticipated that the patient will be medically stable for discharge from the hospital within 2 midnights of admission.   * I certify that at the point of admission it is my clinical judgment that the patient will require inpatient hospital care spanning beyond 2 midnights from the point of admission due to high intensity of service, high risk for further deterioration and high frequency of surveillance required.*  Author: Lucile Shutters,  MD 05/09/2022 2:23 PM  For on call review [email or web address].

## 2022-05-09 NOTE — ED Notes (Signed)
Pt in bed, md notified of blood sugar, d50 amp given, pt awake and answering questions, states last name is Lafond, family states this is base line, pt has difficulty with speech, family feeding pt some apple sauce.

## 2022-05-09 NOTE — ED Provider Notes (Signed)
[facility] Provider Note   Event Date/Time   First MD Initiated Contact with Patient 05/09/22 0845     (approximate) History  Seizures  HPI Katherine Fields is a 56 y.o. 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  EMS states that long-term care facility noted patient to have a seizure this morning approximately 6 AM which is normal for her and for which they did not give any antiepileptic medications at the time.  Patient had an interval state of baseline prior to resumption of patient's altered mental status however no further seizure activity was noted.  EMS noted patient to have intermittent right and then left gaze deviation with no response to verbal stimuli however does withdraw from pain from IV administration.  EMS also noted patient to be severely hypoglycemic with blood glucose in the 30s for which they started patient on IV D10.   Physical Exam  Triage Vital Signs: ED Triage Vitals  Enc Vitals Group     BP      Pulse      Resp      Temp      Temp src      SpO2      Weight      Height      Head Circumference      Peak Flow      Pain Score      Pain Loc      Pain Edu?      Excl. in GC?    Most recent vital signs: Vitals:   05/09/22 1100 05/09/22 1115  BP: (!) 157/97 (!) 156/94  Pulse: 81 89  Resp: 18 (!) 21  Temp:    SpO2: 100% 99%   General: Awake, not following commands CV:  Good peripheral perfusion.  Resp:  Normal effort.  Abd:  No distention.  Other:  Middle-aged African-American female laying in bed in no acute distress with left gaze deviation and spastic bilateral lower extremities ED Results / Procedures / Treatments  Labs (all labs ordered are listed, but only abnormal results are displayed) Labs Reviewed  COMPREHENSIVE METABOLIC PANEL - Abnormal; Notable for the following components:       Result Value   BUN 31 (*)    Creatinine, Ser 1.09 (*)    Total Protein 8.2 (*)    GFR, Estimated 60 (*)    All other components within normal limits  CBC WITH DIFFERENTIAL/PLATELET - Abnormal; Notable for the following components:   RBC 3.80 (*)    RDW 17.1 (*)    All other components within normal limits  LACTIC ACID, PLASMA - Abnormal; Notable for the following components:   Lactic Acid, Venous 2.7 (*)    All other components within normal limits  URINALYSIS, ROUTINE W REFLEX MICROSCOPIC - Abnormal; Notable for the following components:   Color, Urine YELLOW (*)    APPearance HAZY (*)    Glucose, UA 150 (*)    Protein, ur 100 (*)    Nitrite POSITIVE (*)    Leukocytes,Ua MODERATE (*)    WBC, UA >50 (*)    Bacteria, UA MANY (*)    All other components within normal limits  CBG MONITORING, ED - Abnormal; Notable for the following components:   Glucose-Capillary 66 (*)    All other components within normal limits  CBG MONITORING, ED - Abnormal;  Notable for the following components:   Glucose-Capillary 186 (*)    All other components within normal limits  CBG MONITORING, ED - Abnormal; Notable for the following components:   Glucose-Capillary 41 (*)    All other components within normal limits  RESP PANEL BY RT-PCR (FLU A&B, COVID) [id number]  BRAIN NATRIURETIC PEPTIDE  LACTIC ACID, PLASMA  CBG MONITORING, ED   EKG ED ECG REPORT I, Merwyn Katos, the attending physician, personally viewed and interpreted this ECG. Date: 05/09/2022 EKG Time: 0901 Rate: 91 Rhythm: normal sinus rhythm QRS Axis: normal Intervals: normal ST/T Wave abnormalities: normal Narrative Interpretation: no evidence of acute ischemia RADIOLOGY ED MD interpretation: One-view portable chest x-ray interpreted by me shows no evidence of acute abnormalities including no pneumonia, pneumothorax, or widened mediastinum CT of the head without contrast interpreted by me shows no evidence of acute abnormalities  including no intracerebral hemorrhage, obvious masses, or significant edema.  There is repeat evidence of chronic left MCA infarct -Agree with radiology assessment Official radiology report(s): DG Chest Port 1 View  Result Date: 05/09/2022 CLINICAL DATA:  Altered mental status.  Concern for sepsis EXAM: PORTABLE CHEST 1 VIEW COMPARISON:  07/18/2021 FINDINGS: Normal mediastinum and cardiac silhouette. Normal pulmonary vasculature. No evidence of effusion, infiltrate, or pneumothorax. No acute bony abnormality. IMPRESSION: No acute cardiopulmonary process. Electronically Signed   By: Genevive Bi M.D.   On: 05/09/2022 09:45   CT Head Wo Contrast  Result Date: 05/09/2022 CLINICAL DATA:  56 year old female with vomiting and suspected aspiration. 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 EXAM: CT HEAD WITHOUT CONTRAST TECHNIQUE: Contiguous axial images were obtained from the base of the skull through the vertex without intravenous contrast. RADIATION DOSE REDUCTION: This exam was performed according to the departmental dose-optimization program which includes automated exposure control, adjustment of the mA and/or kV according to patient size and/or use of iterative reconstruction technique. COMPARISON:  Brain MRI 08/06/2007.  Head CT 03/14/2016. FINDINGS: Brain: Chronic encephalomalacia anterior left frontal lobe with some ex vacuo enlargement of the left lateral ventricle, stable since 2017 new from 2009 MRI. No superimposed midline shift, ventriculomegaly, mass effect, evidence of mass lesion, intracranial hemorrhage or evidence of cortically based acute infarction. Suspicion of chronic disproportionate cerebellar volume loss (sagittal image 26). Vascular: Calcified atherosclerosis at the skull base. Skull: Stable, negative. Sinuses/Orbits: Visualized paranasal sinuses and mastoids are clear. Other: Visualized orbits and scalp soft tissues are within normal limits. IMPRESSION: 1. No acute intracranial abnormality. 2. Chronic  Left MCA infarct. And suspicion of chronic disproportionate cerebellar atrophy. Electronically Signed   By: Odessa Fleming M.D.   On: 05/09/2022 09:25   PROCEDURES: Critical Care performed: No .1-3 Lead EKG Interpretation  Performed by: Merwyn Katos, MD Authorized by: Merwyn Katos, MD     Interpretation: normal     ECG rate:  888   ECG rate assessment: normal     Rhythm: sinus rhythm     Ectopy: none     Conduction: normal    MEDICATIONS ORDERED IN ED: Medications  dextrose 10 % infusion (has no administration in time range)  levETIRAcetam (KEPPRA) IVPB 1000 mg/100 mL premix (has no administration in time range)  lacosamide (VIMPAT) 100 mg in sodium chloride 0.9 % 25 mL IVPB (has no administration in time range)  cefTRIAXone (ROCEPHIN) 1 g in sodium chloride 0.9 % 100 mL IVPB (has no administration in time range)  sodium chloride 0.9 % bolus 1,000 mL (0 mLs Intravenous Stopped 05/09/22 1137)  dextrose 50 %  solution 50 mL (50 mLs Intravenous Given 05/09/22 0905)  LORazepam (ATIVAN) injection 1 mg (1 mg Intravenous Given 05/09/22 0855)  LORazepam (ATIVAN) injection 1 mg (1 mg Intravenous Given 05/09/22 1001)  cefTRIAXone (ROCEPHIN) 1 g in sodium chloride 0.9 % 100 mL IVPB (1 g Intravenous New Bag/Given 05/09/22 1115)  dextrose 50 % solution 50 mL (50 mLs Intravenous Given 05/09/22 1120)   IMPRESSION / MDM / ASSESSMENT AND PLAN / ED COURSE  I reviewed the triage vital signs and the nursing notes.                             The patient is on the cardiac monitor to evaluate for evidence of arrhythmia and/or significant heart rate changes. Patient's presentation is most consistent with acute presentation with potential threat to life or bodily function.  This patient presents to the ED for concern of altered mental status and hypoglycemia with likely seizure activity, this involves an extensive number of treatment options, and is a complaint that carries with it a high risk of complications and  morbidity.  The differential diagnosis includes breakthrough seizures, UTI, medication nonadherence, pneumonia, viral URI   Co morbidities that complicate the patient evaluation    Nonverbal at baseline and unable to provide full history  Additional history obtained:    Additional history obtained from EMR and family members  External records from outside source obtained and reviewed including multiple family medicine notes including most recent from 09/17/2016   Lab Tests:    I Ordered, and personally interpreted labs.  The pertinent results include: Urinalysis showing nitrite positive, moderate leukocytes, many bacteria concerning for urinary tract infection Lactic acidosis to 2.7 Negative PCR for COVID/flu  Imaging Studies ordered:    I ordered imaging studies including chest x-ray and head CT does not show any evidence of acute abnormalities  I independently visualized and interpreted imaging which showed no evidence of acute abnormalities  I agree with the radiologist interpretation   Cardiac Monitoring: / EKG:    The patient was maintained on a cardiac monitor.  I personally viewed and interpreted the cardiac monitored which showed an underlying rhythm of: Normal sinus rhythm  Consultations Obtained:    I requested consultation with the neurology, Dr. Amada Jupiter,  and discussed lab and imaging findings as well as pertinent plan - they recommend: 1 g Keppra and 100 mg Vimpat IV as well as admission   Problem List / ED Course / Critical interventions / Medication management    Breakthrough seizures, urinary tract infection, hypoglycemia  I ordered medication including multiple aliquots of D50, now on a D10 drip, 2 mg of Ativan, 1 g Keppra, 100 mg Vimpat for breakthrough seizure activity  Reevaluation of the patient after these medicines showed that the patient improved  I have reviewed the patients home medicines and have made adjustments as needed   Dispo:  Admit to medicine     FINAL CLINICAL IMPRESSION(S) / ED DIAGNOSES   Final diagnoses:  Breakthrough seizure (HCC)  Urinary tract infection with hematuria, site unspecified   Rx / DC Orders   ED Discharge Orders     None      Note:  This document was prepared using Dragon voice recognition software and may include unintentional dictation errors.   Merwyn Katos, MD 05/09/22 1224

## 2022-05-09 NOTE — Assessment & Plan Note (Signed)
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

## 2022-05-09 NOTE — ED Notes (Signed)
Pt in bed with eyes open, second rn to  evaluate iv in R forearm, flush with 20 ml ns, no swelling noted with flush.

## 2022-05-09 NOTE — Assessment & Plan Note (Addendum)
Patient with a known history of seizures who presents to the ER for evaluation of breakthrough seizures most likely related to acute UTI and hypoglycemia She was loaded with Keppra and Vimpat in the ER Continue scheduled Vimpat, Keppra and zonisamide Place patient on seizure precautions

## 2022-05-09 NOTE — ED Notes (Signed)
Pt in bed, eeg in progress

## 2022-05-09 NOTE — ED Notes (Signed)
Side rails padded

## 2022-05-09 NOTE — Assessment & Plan Note (Signed)
Continue Cymbalta and oxycodone

## 2022-05-09 NOTE — ED Notes (Signed)
Pt refusing to keep Pulse ox on

## 2022-05-10 DIAGNOSIS — N3001 Acute cystitis with hematuria: Secondary | ICD-10-CM

## 2022-05-10 DIAGNOSIS — R569 Unspecified convulsions: Secondary | ICD-10-CM | POA: Diagnosis not present

## 2022-05-10 DIAGNOSIS — E162 Hypoglycemia, unspecified: Secondary | ICD-10-CM | POA: Diagnosis not present

## 2022-05-10 LAB — CBC
HCT: 30.8 % — ABNORMAL LOW (ref 36.0–46.0)
Hemoglobin: 9.8 g/dL — ABNORMAL LOW (ref 12.0–15.0)
MCH: 31.3 pg (ref 26.0–34.0)
MCHC: 31.8 g/dL (ref 30.0–36.0)
MCV: 98.4 fL (ref 80.0–100.0)
Platelets: 245 10*3/uL (ref 150–400)
RBC: 3.13 MIL/uL — ABNORMAL LOW (ref 3.87–5.11)
RDW: 17.1 % — ABNORMAL HIGH (ref 11.5–15.5)
WBC: 4.4 10*3/uL (ref 4.0–10.5)
nRBC: 0 % (ref 0.0–0.2)

## 2022-05-10 LAB — BASIC METABOLIC PANEL
Anion gap: 5 (ref 5–15)
BUN: 23 mg/dL — ABNORMAL HIGH (ref 6–20)
CO2: 24 mmol/L (ref 22–32)
Calcium: 9 mg/dL (ref 8.9–10.3)
Chloride: 107 mmol/L (ref 98–111)
Creatinine, Ser: 1.05 mg/dL — ABNORMAL HIGH (ref 0.44–1.00)
GFR, Estimated: >60 mL/min (ref >60)
Glucose, Bld: 52 mg/dL — ABNORMAL LOW (ref 70–99)
Potassium: 3.5 mmol/L (ref 3.5–5.1)
Sodium: 136 mmol/L (ref 135–145)

## 2022-05-10 LAB — CBG MONITORING, ED
Glucose-Capillary: 90 mg/dL (ref 70–99)
Glucose-Capillary: 54 mg/dL — ABNORMAL LOW (ref 70–99)
Glucose-Capillary: 169 mg/dL — ABNORMAL HIGH (ref 70–99)
Glucose-Capillary: 62 mg/dL — ABNORMAL LOW (ref 70–99)
Glucose-Capillary: 81 mg/dL (ref 70–99)
Glucose-Capillary: 55 mg/dL — ABNORMAL LOW (ref 70–99)

## 2022-05-10 LAB — HIV ANTIBODY (ROUTINE TESTING W REFLEX): HIV Screen 4th Generation wRfx: NONREACTIVE

## 2022-05-10 MED ORDER — DEXTROSE 50 % IV SOLN: 12.5000 g | Freq: Once | INTRAVENOUS | Status: AC

## 2022-05-10 MED ORDER — DEXTROSE 50 % IV SOLN: 25.0000 mL | Freq: Once | INTRAVENOUS | Status: AC

## 2022-05-10 MED ORDER — DEXTROSE 50 % IV SOLN
INTRAVENOUS | Status: AC
  Filled 2022-05-10: qty 50

## 2022-05-10 MED ORDER — DEXTROSE 50 % IV SOLN
INTRAVENOUS | Status: AC
  Administered 2022-05-10: 12.5 g via INTRAVENOUS
  Filled 2022-05-10: qty 50

## 2022-05-10 MED ORDER — DEXTROSE 50 % IV SOLN
INTRAVENOUS | Status: AC
  Administered 2022-05-10: 25 mL via INTRAVENOUS
  Filled 2022-05-10: qty 50

## 2022-05-10 NOTE — ED Notes (Signed)
Pt cleaned up. New brief placed, purewick in place as well

## 2022-05-10 NOTE — Procedures (Signed)
History: 56 year old female with a rate of seizures and neurodegenerative disorder  Sedation: None  Technique: This EEG was acquired with electrodes placed according to the International 10-20 electrode system (including Fp1, Fp2, F3, F4, C3, C4, P3, P4, O1, O2, T3, T4, T5, T6, A1, A2, Fz, Cz, Pz). The following electrodes were missing or displaced: none.   Background: There is a posterior dominant rhythm of 8 Hz which is seen bilaterally.  In addition, there is generalized irregular delta and theta range activities intruding into the background.  Photic stimulation: Physiologic driving is not performed  EEG Abnormalities: 1) generalized irregular slow activity  Clinical Interpretation: This normal EEG is recorded in the waking state. There was no seizure or seizure predisposition recorded on this study. Please note that lack of epileptiform activity on EEG does not preclude the possibility of epilepsy.   Ritta Slot, MD [facility] [phone]  If 7pm- 7am, please page neurology on call as listed in AMION.

## 2022-05-10 NOTE — ED Notes (Signed)
Pt heard groaning loudly from nurses station. This tech and [place], NT to pt room. Pt gown covered in blood, Pt IV is laying on bed. This tech called pts primary RN to bedside. This tech and [place], NT cleaned blood off of pt and applied new gown. IV catheters that were pulled out were disposed of.

## 2022-05-10 NOTE — ED Notes (Signed)
CBG 81.  

## 2022-05-10 NOTE — ED Notes (Signed)
Pt sleeping. 

## 2022-05-10 NOTE — ED Notes (Addendum)
Patient's daughter at bedside, MD at bedside. MD reports that patient's daughter is unhappy with the care that has been provided to the patient while in the ER. Today she expresses that there is a spot of blood on the exterior of her brief. Blood present from patient removing her own IV; patient's daughter reports other reasons why she is unhappy with the quality of care that her mother has experienced while admitted.   Patient resides at [facility], facility contacted to determine and verify if daughter at bedside Katherine Fields) is patient's POA. Facility verified this information. This RN notified staff that patient would be signing out AGAINST medical advice via POA and that her blood sugar has been dropping critically low, last cbg 55 and treated with 12.5g dextrose.  Md unable to reason with patient's daughter and that we strongly encourage patient to stay due to life threatening conditions. Per daughter's request, purewick and IV removed.   Katherine Fields has signed paper copy of AMA paperwork. Paper placed in medical records box. Patient discussed at length with charge nurse Herbert Seta).

## 2022-05-10 NOTE — Discharge Summary (Addendum)
Physician Discharge Summary   Patient: Katherine Fields MRN: [id number] DOB: [birth year]  Admit date:     05/23/2022  Discharge date: 05/10/22  Discharge Physician: Katherine Fields   PCP: Katherine Shown, DO   Recommendations at discharge:   Follow-up with PCP or physician at the nursing home  Discharge Diagnoses: Principal Problem:   Hypoglycemia Active Problems:   Seizure ([place])   Dysphagia   Stiffman syndrome   Chronic pain   UTI (urinary tract infection)   Essential hypertension   Functional quadriplegia (HCC)  Resolved Problems:   * No resolved hospital problems. 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  Reportedly, she had 2 episodes of back-to-back seizures at the nursing home.  EMS was called and patient was noted to have a blood glucose level of 33.  She was started on 10% dextrose infusion and was transported to the emergency room.  In the ER, she had recurrent episodes of hypoglycemia, lowest glucose recorded was 29.  She is not a known diabetic and she is not on any hypoglycemic agents.  She was admitted to the hospital for recurrent hypoglycemia and breakthrough seizures.  She was treated with IV dextrose.  Neurologist recommended continuation of home antiepileptics and treatment of UTI.  She was also found to have urinary tract infection and she was started on IV ceftriaxone.  I saw the patient on 05/10/2022 at 9:36 AM.  Katherine Fields, daughter, was in the room.  She was very unhappy about the care that the patient was receiving in the emergency room.  She complained that when she came  in to visit her mother, her food was on the table and nobody had tried to feed her.  She also complained that patient was laying  on bloody bed sheets.  She immediately requested that patient be discharged from the hospital.  I explained that patient was still having hypoglycemic episodes and she has a UTI that needs to be investigated and treated.  However, she was persistent in her demands, and said that she will send the patient back to the nursing home where she can receive treatment.  I explained that since patient was not medically ready for discharge, she would have to leave [place].  She understands that this is not a good option and that patient could have potential complications including but not limited to recurrent hypoglycemia, recurrent seizures, sepsis, shock, cardiopulmonary arrest and death.  She said she was ready to sign the papers.  Katherine Fields, Therapist, sports, was notified.       Consultants: Neurologist Procedures performed: None  Disposition:  Left AGAINST MEDICAL ADVICE for SNF  Discharge Exam: Filed Weights   05/23/22 0927  Weight: 54.4 kg   GEN: NAD SKIN: Warm and dry EYES: No pallor or icterus ENT: MMM CV: RRR PULM: CTA B ABD: soft, ND, NT, +BS CNS: Alert, follows simple commands, moves extremities spontaneously, speech is slurred and incomprehensible  EXT: No edema or tenderness   Condition at discharge: stable  The results of significant diagnostics from this hospitalization (including imaging, microbiology, ancillary and laboratory) are listed below for reference.   Imaging Studies: EEG adult  Result Date: 2022/05/23 Katherine Doom, MD     05/10/2022  8:52 AM History: 56 year old female with a rate of seizures  and neurodegenerative disorder Sedation: None Technique: This EEG was acquired with electrodes placed according to the International 10-20 electrode system (including Fp1, Fp2, F3, F4, C3, C4, P3, P4, O1, O2, T3, T4, T5, T6, A1, A2, Fz, Cz, Pz). The following electrodes were missing or displaced: none. Background: There is a posterior dominant rhythm of 8 Hz which is seen  bilaterally.  In addition, there is generalized irregular delta and theta range activities intruding into the background. Photic stimulation: Physiologic driving is not performed EEG Abnormalities: 1) generalized irregular slow activity Clinical Interpretation: This normal EEG is recorded in the waking state. There was no seizure or seizure predisposition recorded on this study. Please note that lack of epileptiform activity on EEG does not preclude the possibility of epilepsy. Katherine Slot, MD [facility] [phone] If 7pm- 7am, please page neurology on call as listed in AMION.   DG Chest Port 1 View  Result Date: 05/09/2022 CLINICAL DATA:  Altered mental status.  Concern for sepsis EXAM: PORTABLE CHEST 1 VIEW COMPARISON:  07/18/2021 FINDINGS: Normal mediastinum and cardiac silhouette. Normal pulmonary vasculature. No evidence of effusion, infiltrate, or pneumothorax. No acute bony abnormality. IMPRESSION: No acute cardiopulmonary process. Electronically Signed   By: Genevive Bi M.D.   On: 05/09/2022 09:45   CT Head Wo Contrast  Result Date: 05/09/2022 CLINICAL DATA:  56 year old female with vomiting and suspected aspiration. 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 EXAM: CT HEAD WITHOUT CONTRAST TECHNIQUE: Contiguous axial images were obtained from the base of the skull through the vertex without intravenous contrast. RADIATION DOSE REDUCTION: This exam was performed according to the departmental dose-optimization program which includes automated exposure control, adjustment of the mA and/or kV according to patient size and/or use of iterative reconstruction technique. COMPARISON:  Brain MRI 08/06/2007.  Head CT 03/14/2016. FINDINGS: Brain: Chronic encephalomalacia anterior left frontal lobe with some ex vacuo enlargement of the left lateral ventricle, stable since 2017 new from 2009 MRI. No superimposed midline shift, ventriculomegaly, mass effect, evidence of mass lesion, intracranial hemorrhage  or evidence of cortically based acute infarction. Suspicion of chronic disproportionate cerebellar volume loss (sagittal image 26). Vascular: Calcified atherosclerosis at the skull base. Skull: Stable, negative. Sinuses/Orbits: Visualized paranasal sinuses and mastoids are clear. Other: Visualized orbits and scalp soft tissues are within normal limits. IMPRESSION: 1. No acute intracranial abnormality. 2. Chronic Left MCA infarct. And suspicion of chronic disproportionate cerebellar atrophy. Electronically Signed   By: Odessa Fleming M.D.   On: 05/09/2022 09:25    Microbiology: Results for orders placed or performed during the hospital encounter of 05/09/22  Resp Panel by RT-PCR (Flu A&B, Covid) Anterior Nasal Swab     Status: None   Collection Time: 05/09/22  9:11 AM   Specimen: Anterior Nasal Swab  Result Value Ref Range Status   SARS Coronavirus 2 by RT PCR NEGATIVE NEGATIVE Final    Comment: (NOTE) SARS-CoV-2 target nucleic acids are NOT DETECTED.  The SARS-CoV-2 RNA is generally detectable in upper respiratory specimens during the acute phase of infection. The lowest concentration of SARS-CoV-2 viral copies this assay can detect is 138 copies/mL. A negative result does not preclude SARS-Cov-2 infection and should not be used as the sole basis for treatment or other patient management decisions. A negative result may occur with  improper specimen collection/handling, submission of specimen other than nasopharyngeal swab, presence of viral mutation(s) within the areas targeted by this assay, and inadequate number of viral copies(<138 copies/mL). A negative result must be combined with clinical observations, patient history,  and epidemiological information. The expected result is Negative.  Fact Sheet for Patients:  EntrepreneurPulse.com.au  Fact Sheet for Healthcare Providers:  IncredibleEmployment.be  This test is no t yet approved or cleared by the  [place] FDA and  has been authorized for detection and/or diagnosis of SARS-CoV-2 by FDA under an Emergency Use Authorization (EUA). This EUA will remain  in effect (meaning this test can be used) for the duration of the COVID-19 declaration under Section 564(b)(1) of the Act, 21 U.S.C.section 360bbb-3(b)(1), unless the authorization is terminated  or revoked sooner.       Influenza A by PCR NEGATIVE NEGATIVE Final   Influenza B by PCR NEGATIVE NEGATIVE Final    Comment: (NOTE) The Xpert Xpress SARS-CoV-2/FLU/RSV plus assay is intended as an aid in the diagnosis of influenza from Nasopharyngeal swab specimens and should not be used as a sole basis for treatment. Nasal washings and aspirates are unacceptable for Xpert Xpress SARS-CoV-2/FLU/RSV testing.  Fact Sheet for Patients: EntrepreneurPulse.com.au  Fact Sheet for Healthcare Providers: IncredibleEmployment.be  This test is not yet approved or cleared by the [place] FDA and has been authorized for detection and/or diagnosis of SARS-CoV-2 by FDA under an Emergency Use Authorization (EUA). This EUA will remain in effect (meaning this test can be used) for the duration of the COVID-19 declaration under Section 564(b)(1) of the Act, 21 U.S.C. section 360bbb-3(b)(1), unless the authorization is terminated or revoked.  Performed at [facility], [address] [place], [place] [address]     Labs: CBC: Recent Labs  Lab 05/09/22 0911 05/10/22 0447  WBC 5.7 4.4  NEUTROABS 4.7  --   HGB 12.0 9.8*  HCT 37.0 30.8*  MCV 97.4 98.4  PLT 279 [id number]   Basic Metabolic Panel: Recent Labs  Lab 05/09/22 0911 05/10/22 0447  NA 143 136  K 4.9 3.5  CL 110 107  CO2 27 24  GLUCOSE 78 52*  BUN 31* 23*  CREATININE 1.09* 1.05*  CALCIUM 9.9 9.0   Liver Function Tests: Recent Labs  Lab 05/09/22 0911  AST 36  ALT 20  ALKPHOS 83  BILITOT 0.6  PROT 8.2*  ALBUMIN 4.2    CBG: Recent Labs  Lab 05/10/22 0411 05/10/22 0440 05/10/22 0557 05/10/22 0714 05/10/22 0845  GLUCAP 54* 169* 62* 81 55*    Discharge time spent: greater than 30 minutes.  Signed: Jennye Boroughs, MD [facility] 05/10/2022

## 2022-05-10 NOTE — ED Notes (Signed)
CBG 54 amp of dextrose given

## 2022-07-07 ENCOUNTER — Emergency Department: Payer: Medicaid Other

## 2022-07-07 ENCOUNTER — Other Ambulatory Visit: Payer: Self-pay

## 2022-07-07 ENCOUNTER — Emergency Department
Admission: EM | Admit: 2022-07-07 | Discharge: 2022-07-07 | Disposition: A | Discharge status: Skilled Nursing Facility | Payer: Medicaid Other | Attending: Emergency Medicine | Admitting: Emergency Medicine

## 2022-07-07 DIAGNOSIS — G40909 Epilepsy, unspecified, not intractable, without status epilepticus: Secondary | ICD-10-CM | POA: Insufficient documentation

## 2022-07-07 DIAGNOSIS — N179 Acute kidney failure, unspecified: Secondary | ICD-10-CM | POA: Insufficient documentation

## 2022-07-07 DIAGNOSIS — G40919 Epilepsy, unspecified, intractable, without status epilepticus: Secondary | ICD-10-CM

## 2022-07-07 LAB — URINALYSIS, ROUTINE W REFLEX MICROSCOPIC
Bacteria, UA: NONE SEEN
Bilirubin Urine: NEGATIVE
Glucose, UA: NEGATIVE mg/dL
Hgb urine dipstick: NEGATIVE
Ketones, ur: NEGATIVE mg/dL
Leukocytes,Ua: NEGATIVE
Nitrite: NEGATIVE
Protein, ur: 100 mg/dL — AB
Specific Gravity, Urine: 1.015 (ref 1.005–1.030)
pH: 6 (ref 5.0–8.0)

## 2022-07-07 LAB — CBC WITH DIFFERENTIAL/PLATELET
Abs Immature Granulocytes: 0.01 10*3/uL (ref 0.00–0.07)
Basophils Absolute: 0 10*3/uL (ref 0.0–0.1)
Basophils Relative: 1 %
Eosinophils Absolute: 0 10*3/uL (ref 0.0–0.5)
Eosinophils Relative: 0 %
HCT: 37 % (ref 36.0–46.0)
Hemoglobin: 12.1 g/dL (ref 12.0–15.0)
Immature Granulocytes: 0 %
Lymphocytes Relative: 32 %
Lymphs Abs: 1.1 10*3/uL (ref 0.7–4.0)
MCH: 35.1 pg — ABNORMAL HIGH (ref 26.0–34.0)
MCHC: 32.7 g/dL (ref 30.0–36.0)
MCV: 107.2 fL — ABNORMAL HIGH (ref 80.0–100.0)
Monocytes Absolute: 0.3 10*3/uL (ref 0.1–1.0)
Monocytes Relative: 7 %
Neutro Abs: 2.1 10*3/uL (ref 1.7–7.7)
Neutrophils Relative %: 60 %
Platelets: 275 10*3/uL (ref 150–400)
RBC: 3.45 MIL/uL — ABNORMAL LOW (ref 3.87–5.11)
RDW: 16.2 % — ABNORMAL HIGH (ref 11.5–15.5)
WBC: 3.6 10*3/uL — ABNORMAL LOW (ref 4.0–10.5)
nRBC: 0 % (ref 0.0–0.2)

## 2022-07-07 LAB — BASIC METABOLIC PANEL
Anion gap: 7 (ref 5–15)
BUN: 25 mg/dL — ABNORMAL HIGH (ref 6–20)
CO2: 28 mmol/L (ref 22–32)
Calcium: 9.6 mg/dL (ref 8.9–10.3)
Chloride: 105 mmol/L (ref 98–111)
Creatinine, Ser: 1.32 mg/dL — ABNORMAL HIGH (ref 0.44–1.00)
GFR, Estimated: 48 mL/min — ABNORMAL LOW (ref >60)
Glucose, Bld: 71 mg/dL (ref 70–99)
Potassium: 4.3 mmol/L (ref 3.5–5.1)
Sodium: 140 mmol/L (ref 135–145)

## 2022-07-07 LAB — CBG MONITORING, ED: Glucose-Capillary: 87 mg/dL (ref 70–99)

## 2022-07-07 MED ORDER — BACLOFEN 10 MG PO TABS
5.0000 mg | ORAL_TABLET | Freq: Once | ORAL | Status: AC
  Administered 2022-07-07: 5 mg via ORAL
  Filled 2022-07-07: qty 1

## 2022-07-07 MED ORDER — ZONISAMIDE 100 MG PO CAPS
100.0000 mg | ORAL_CAPSULE | Freq: Once | ORAL | Status: AC
  Administered 2022-07-07: 100 mg via ORAL
  Filled 2022-07-07: qty 1

## 2022-07-07 MED ORDER — LACTATED RINGERS IV BOLUS
1000.0000 mL | Freq: Once | INTRAVENOUS | Status: AC
  Administered 2022-07-07: 1000 mL via INTRAVENOUS

## 2022-07-07 MED ORDER — SODIUM CHLORIDE 0.9 % IV SOLN
100.0000 mg | Freq: Once | INTRAVENOUS | Status: AC
  Administered 2022-07-07: 100 mg via INTRAVENOUS
  Filled 2022-07-07: qty 10

## 2022-07-07 MED ORDER — LEVETIRACETAM IN NACL 1000 MG/100ML IV SOLN
1000.0000 mg | Freq: Once | INTRAVENOUS | Status: AC
  Administered 2022-07-07: 1000 mg via INTRAVENOUS
  Filled 2022-07-07: qty 100

## 2022-07-07 NOTE — ED Notes (Signed)
Called [facility] for transport to [facility]

## 2022-07-07 NOTE — ED Notes (Signed)
Changed pt brief. Pt tolerated well. Denies further needs at this time.

## 2022-07-07 NOTE — ED Provider Notes (Signed)
[facility] Provider Note    Event Date/Time   First MD Initiated Contact with Patient 07/07/22 [phone]     (approximate)   History   Chief Complaint Seizures   HPI  Katherine Fields is a 57 y.o. female with past medical history of intracranial hemorrhage, stiff person syndrome, and seizure disorder who presents to the ED for seizure.  History is limited as patient is reportedly nonverbal at baseline.  Per EMS, staff at [facility] noted patient to be quite tremulous this morning and was concerned that she may have had a seizure.  Staff reportedly administered 2 mg of Ativan and called EMS.  Patient has reportedly been taking her seizure medications as prescribed, currently takes Keppra, zonisamide, and Vimpat, but has not yet had her morning doses of these medications.  EMS was told by facility that patient has significant stiffness at baseline and currently appears to be at her baseline.     Physical Exam   Triage Vital Signs: ED Triage Vitals  Enc Vitals Group     BP      Pulse      Resp      Temp      Temp src      SpO2      Weight      Height      Head Circumference      Peak Flow      Pain Score      Pain Loc      Pain Edu?      Excl. in [place]?     Most recent vital signs: Vitals:   07/07/22 1300 07/07/22 1420  BP: 120/70   Pulse:    Resp: 20   Temp:  98.2 F (36.8 C)  SpO2:      Constitutional: Somnolent but arousable to voice. Eyes: Conjunctivae are normal.  Pupils equal, round, and reactive to light bilaterally.  Gaze midline. Head: Atraumatic. Nose: No congestion/rhinnorhea. Mouth/Throat: Mucous membranes are moist.  Cardiovascular: Normal rate, regular rhythm. Grossly normal heart sounds.  2+ radial pulses bilaterally. Respiratory: Normal respiratory effort.  No retractions. Lungs CTAB. Gastrointestinal: Soft and nontender. No distention. Musculoskeletal: No lower extremity tenderness nor edema.  Neurologic: Patient  nonverbal. No gross focal neurologic deficits are appreciated, spontaneous movements in all 4 extremities.  Stiffness noted in all 4 extremities.    ED Results / Procedures / Treatments   Labs (all labs ordered are listed, but only abnormal results are displayed) Labs Reviewed  CBC WITH DIFFERENTIAL/PLATELET - Abnormal; Notable for the following components:      Result Value   WBC 3.6 (*)    RBC 3.45 (*)    MCV 107.2 (*)    MCH 35.1 (*)    RDW 16.2 (*)    All other components within normal limits  URINALYSIS, ROUTINE W REFLEX MICROSCOPIC - Abnormal; Notable for the following components:   Color, Urine YELLOW (*)    APPearance HAZY (*)    Protein, ur 100 (*)    All other components within normal limits  BASIC METABOLIC PANEL - Abnormal; Notable for the following components:   BUN 25 (*)    Creatinine, Ser 1.32 (*)    GFR, Estimated 48 (*)    All other components within normal limits  CBG MONITORING, ED     EKG  ED ECG REPORT I, Blake Divine, the attending physician, personally viewed and interpreted this ECG.   Date: 07/07/2022  EKG Time: 9:58  Rate: 100  Rhythm: sinus tachycardia  Axis: Normal  Intervals:none  ST&T Change: None  RADIOLOGY CT head reviewed and interpreted by me with encephalomalacia similar to previous, no hemorrhage or midline shift.  PROCEDURES:  Critical Care performed: No  Procedures   MEDICATIONS ORDERED IN ED: Medications  levETIRAcetam (KEPPRA) IVPB 1000 mg/100 mL premix (0 mg Intravenous Stopped 07/07/22 1125)  lacosamide (VIMPAT) 100 mg in sodium chloride 0.9 % 25 mL IVPB (0 mg Intravenous Stopped 07/07/22 1211)  zonisamide (ZONEGRAN) capsule 100 mg (100 mg Oral Given 07/07/22 1054)  baclofen (LIORESAL) tablet 5 mg (5 mg Oral Given 07/07/22 1053)  lactated ringers bolus 1,000 mL (1,000 mLs Intravenous New Bag/Given 07/07/22 1342)     IMPRESSION / MDM / ASSESSMENT AND PLAN / ED COURSE  I reviewed the triage vital signs and the nursing  notes.                              57 y.o. female with past medical history of intracranial hemorrhage, seizure disorder, and stiff person syndrome who presents to the ED following possible seizure at her nursing facility.  Patient's presentation is most consistent with acute presentation with potential threat to life or bodily function.  Differential diagnosis includes, but is not limited to, breakthrough seizure, medication noncompliance, infectious process, intracranial process, electrolyte abnormality.  Patient nontoxic-appearing on arrival and in no acute distress, appears somnolent but opens eyes to voice with midline gaze and no apparent focal deficits.  She is moving all 4 extremities spontaneously and no evidence of ongoing seizure activity.  It is unclear whether she had a seizure earlier this morning based on description from EMS, but current presentation could be her postictal state.  CT head was performed and negative for acute process, labs are pending at this time.  She is due for her morning dose of anticonvulsant medication, which we will give here in the ED.  Patient now increasingly awake and alert, does not respond verbally but points to her legs to indicate discomfort.  Her legs appear stiff and she is due for her usual baclofen, which was given with improvement in her symptoms.  Labs remarkable for mild AKI and patient will hydrate with IV fluids.  No significant electrolyte abnormality, anemia, or leukocytosis noted.  Urinalysis shows no signs of infection, patient with no further seizure activity here in the ED.  She is appropriate for discharge back to nursing facility and I spoke with her daughter over the phone, encouraged her to have patient follow-up with neurology and to return to the ED for new or worsening symptoms.  Daughter agrees with plan.      FINAL CLINICAL IMPRESSION(S) / ED DIAGNOSES   Final diagnoses:  Breakthrough seizure ([place])     Rx / DC Orders    ED Discharge Orders     None        Note:  This document was prepared using Dragon voice recognition software and may include unintentional dictation errors.   Blake Divine, MD 07/07/22 [phone]

## 2022-07-07 NOTE — ED Triage Notes (Signed)
Pt had "seizure like activity" presenting as tremors. From [facility], staff administered 2 mg of Ativan and called EMS. Pt has history of seizures and is on Keppra, pt has history of stiffness syndrome, is nonverbal and non-ambulatory at baseline.

## 2022-07-07 NOTE — ED Notes (Signed)
Boosted pt up in bed and gave warm blankets

## 2022-07-10 ENCOUNTER — Encounter: Payer: Self-pay | Admitting: Nurse Practitioner

## 2022-07-14 ENCOUNTER — Other Ambulatory Visit: Payer: Self-pay | Admitting: Family Medicine

## 2022-07-14 DIAGNOSIS — M7989 Other specified soft tissue disorders: Secondary | ICD-10-CM

## 2022-07-29 ENCOUNTER — Ambulatory Visit
Admission: RE | Admit: 2022-07-29 | Discharge: 2022-07-29 | Disposition: A | Discharge status: Home / Self Care | Payer: Medicaid Other | Source: Ambulatory Visit | Attending: Family Medicine | Admitting: Family Medicine

## 2022-07-29 DIAGNOSIS — M7989 Other specified soft tissue disorders: Secondary | ICD-10-CM | POA: Diagnosis present

## 2022-07-31 ENCOUNTER — Inpatient Hospital Stay
Admission: EM | Admit: 2022-07-31 | Discharge: 2022-08-04 | DRG: 689 | Disposition: A | Discharge status: Skilled Nursing Facility | Payer: Medicaid Other | Source: Skilled Nursing Facility | Attending: Student in an Organized Health Care Education/Training Program | Admitting: Student in an Organized Health Care Education/Training Program

## 2022-07-31 DIAGNOSIS — G40909 Epilepsy, unspecified, not intractable, without status epilepticus: Secondary | ICD-10-CM

## 2022-07-31 DIAGNOSIS — K5289 Other specified noninfective gastroenteritis and colitis: Secondary | ICD-10-CM

## 2022-07-31 DIAGNOSIS — G40309 Generalized idiopathic epilepsy and epileptic syndromes, not intractable, without status epilepticus: Secondary | ICD-10-CM | POA: Diagnosis present

## 2022-07-31 DIAGNOSIS — E162 Hypoglycemia, unspecified: Secondary | ICD-10-CM | POA: Diagnosis present

## 2022-07-31 DIAGNOSIS — Z7401 Bed confinement status: Secondary | ICD-10-CM

## 2022-07-31 DIAGNOSIS — R1310 Dysphagia, unspecified: Secondary | ICD-10-CM | POA: Diagnosis present

## 2022-07-31 DIAGNOSIS — G2582 Stiff-man syndrome: Secondary | ICD-10-CM | POA: Diagnosis present

## 2022-07-31 DIAGNOSIS — K5130 Ulcerative (chronic) rectosigmoiditis without complications: Secondary | ICD-10-CM | POA: Diagnosis present

## 2022-07-31 DIAGNOSIS — G8929 Other chronic pain: Secondary | ICD-10-CM | POA: Diagnosis present

## 2022-07-31 DIAGNOSIS — R569 Unspecified convulsions: Secondary | ICD-10-CM

## 2022-07-31 DIAGNOSIS — I69359 Hemiplegia and hemiparesis following cerebral infarction affecting unspecified side: Secondary | ICD-10-CM

## 2022-07-31 DIAGNOSIS — Z79899 Other long term (current) drug therapy: Secondary | ICD-10-CM

## 2022-07-31 DIAGNOSIS — I69391 Dysphagia following cerebral infarction: Secondary | ICD-10-CM

## 2022-07-31 DIAGNOSIS — G40409 Other generalized epilepsy and epileptic syndromes, not intractable, without status epilepticus: Secondary | ICD-10-CM | POA: Diagnosis present

## 2022-07-31 DIAGNOSIS — I129 Hypertensive chronic kidney disease with stage 1 through stage 4 chronic kidney disease, or unspecified chronic kidney disease: Secondary | ICD-10-CM | POA: Diagnosis present

## 2022-07-31 DIAGNOSIS — Z931 Gastrostomy status: Secondary | ICD-10-CM

## 2022-07-31 DIAGNOSIS — Z1152 Encounter for screening for COVID-19: Secondary | ICD-10-CM

## 2022-07-31 DIAGNOSIS — N1831 Chronic kidney disease, stage 3a: Secondary | ICD-10-CM | POA: Diagnosis present

## 2022-07-31 DIAGNOSIS — Z66 Do not resuscitate: Secondary | ICD-10-CM | POA: Diagnosis present

## 2022-07-31 DIAGNOSIS — I69351 Hemiplegia and hemiparesis following cerebral infarction affecting right dominant side: Secondary | ICD-10-CM

## 2022-07-31 DIAGNOSIS — N390 Urinary tract infection, site not specified: Secondary | ICD-10-CM | POA: Diagnosis present

## 2022-07-31 DIAGNOSIS — K529 Noninfective gastroenteritis and colitis, unspecified: Principal | ICD-10-CM

## 2022-07-31 DIAGNOSIS — G8220 Paraplegia, unspecified: Secondary | ICD-10-CM

## 2022-07-31 DIAGNOSIS — R532 Functional quadriplegia: Secondary | ICD-10-CM | POA: Diagnosis present

## 2022-07-31 DIAGNOSIS — I10 Essential (primary) hypertension: Secondary | ICD-10-CM | POA: Diagnosis present

## 2022-07-31 LAB — CBC WITH DIFFERENTIAL/PLATELET
Abs Immature Granulocytes: 0 10*3/uL (ref 0.00–0.07)
Basophils Absolute: 0 10*3/uL (ref 0.0–0.1)
Basophils Relative: 0 %
Eosinophils Absolute: 0 10*3/uL (ref 0.0–0.5)
Eosinophils Relative: 0 %
HCT: 35.4 % — ABNORMAL LOW (ref 36.0–46.0)
Hemoglobin: 11.7 g/dL — ABNORMAL LOW (ref 12.0–15.0)
Immature Granulocytes: 0 %
Lymphocytes Relative: 51 %
Lymphs Abs: 2.1 10*3/uL (ref 0.7–4.0)
MCH: 35.9 pg — ABNORMAL HIGH (ref 26.0–34.0)
MCHC: 33.1 g/dL (ref 30.0–36.0)
MCV: 108.6 fL — ABNORMAL HIGH (ref 80.0–100.0)
Monocytes Absolute: 0.4 10*3/uL (ref 0.1–1.0)
Monocytes Relative: 10 %
Neutro Abs: 1.6 10*3/uL — ABNORMAL LOW (ref 1.7–7.7)
Neutrophils Relative %: 39 %
Platelets: 237 10*3/uL (ref 150–400)
RBC: 3.26 MIL/uL — ABNORMAL LOW (ref 3.87–5.11)
RDW: 14.8 % (ref 11.5–15.5)
WBC: 4.2 10*3/uL (ref 4.0–10.5)
nRBC: 0 % (ref 0.0–0.2)

## 2022-07-31 LAB — URINALYSIS, ROUTINE W REFLEX MICROSCOPIC
Bilirubin Urine: NEGATIVE
Glucose, UA: NEGATIVE mg/dL
Hgb urine dipstick: NEGATIVE
Ketones, ur: 5 mg/dL — AB
Nitrite: POSITIVE — AB
Protein, ur: NEGATIVE mg/dL
Specific Gravity, Urine: 1.025 (ref 1.005–1.030)
Squamous Epithelial / HPF: NONE SEEN /HPF (ref 0–5)
pH: 5 (ref 5.0–8.0)

## 2022-07-31 LAB — BASIC METABOLIC PANEL
Anion gap: 10 (ref 5–15)
BUN: 22 mg/dL — ABNORMAL HIGH (ref 6–20)
CO2: 24 mmol/L (ref 22–32)
Calcium: 9.2 mg/dL (ref 8.9–10.3)
Chloride: 104 mmol/L (ref 98–111)
Creatinine, Ser: 1.24 mg/dL — ABNORMAL HIGH (ref 0.44–1.00)
GFR, Estimated: 51 mL/min — ABNORMAL LOW (ref >60)
Glucose, Bld: 83 mg/dL (ref 70–99)
Potassium: 4.6 mmol/L (ref 3.5–5.1)
Sodium: 138 mmol/L (ref 135–145)

## 2022-07-31 MED ORDER — LACTATED RINGERS IV BOLUS
1000.0000 mL | Freq: Once | INTRAVENOUS | Status: AC
  Administered 2022-07-31: 1000 mL via INTRAVENOUS

## 2022-07-31 MED ORDER — METOCLOPRAMIDE HCL 5 MG/ML IJ SOLN
10.0000 mg | Freq: Once | INTRAMUSCULAR | Status: AC
  Administered 2022-07-31: 10 mg via INTRAVENOUS
  Filled 2022-07-31: qty 2

## 2022-07-31 NOTE — ED Triage Notes (Signed)
Pt sent by facility for evaluation of ileus. Pt had test and imaging completed at the facility to determine this. Pt radiology studies showed moderate stool in her colon and rectum. Pt asleep during triage but arousable. Pt is non-verbal and also non-ambulatory at baseline.

## 2022-07-31 NOTE — ED Provider Notes (Signed)
[facility] Provider Note    Event Date/Time   First MD Initiated Contact with Patient 07/31/22 2256     (approximate)   History   Abdominal Pain   HPI  Katherine Fields is a 57 y.o. female who presents to the ED for evaluation of Abdominal Pain   I reviewed medical DC summary from 12/9.  Patient is a history of stroke causing paresis, seizure disorder, dysphagia with G-tube, cognitive dysfunction and stiff person syndrome.  Was admitted for breakthrough seizures and hypoglycemia then.  Reportedly nonverbal at baseline and bedbound.  Patient presents from her local SNF due to an outpatient x-ray showing concerns for ileus.  This was reportedly obtained due to discomfort and whimpering that she seemed to be feeling related to her abdomen.  She is unable to provide any relevant history, she is nonverbal.   Physical Exam   Triage Vital Signs: ED Triage Vitals  Enc Vitals Group     BP 07/31/22 2252 121/79     Pulse Rate 07/31/22 2252 73     Resp 07/31/22 2252 16     Temp 07/31/22 2252 99.7 F (37.6 C)     Temp Source 07/31/22 2252 Oral     SpO2 07/31/22 2251 100 %     Weight 07/31/22 2255 148 lb 9.4 oz (67.4 kg)     Height --      Head Circumference --      Peak Flow --      Pain Score --      Pain Loc --      Pain Edu? --      Excl. in [place]? --     Most recent vital signs: Vitals:   08/01/22 0100 08/01/22 0200  BP: 114/67 (!) 140/103  Pulse: 65 65  Resp:  18  Temp:    SpO2: 99% 100%    General: Awake, no distress.  CV:  Good peripheral perfusion.  Resp:  Normal effort.  Abd:  Mildly distended.  Not tense or peritonitic but does seem to cause her discomfort with palpation throughout and particularly to the lower abdomen MSK:  No deformity noted.  Neuro:  No focal deficits appreciated. Other:     ED Results / Procedures / Treatments   Labs (all labs ordered are listed, but only abnormal results are displayed) Labs Reviewed  CBC  WITH DIFFERENTIAL/PLATELET - Abnormal; Notable for the following components:      Result Value   RBC 3.26 (*)    Hemoglobin 11.7 (*)    HCT 35.4 (*)    MCV 108.6 (*)    MCH 35.9 (*)    Neutro Abs 1.6 (*)    All other components within normal limits  BASIC METABOLIC PANEL - Abnormal; Notable for the following components:   BUN 22 (*)    Creatinine, Ser 1.24 (*)    GFR, Estimated 51 (*)    All other components within normal limits  URINALYSIS, ROUTINE W REFLEX MICROSCOPIC - Abnormal; Notable for the following components:   Color, Urine AMBER (*)    APPearance HAZY (*)    Ketones, ur 5 (*)    Nitrite POSITIVE (*)    Leukocytes,Ua TRACE (*)    Bacteria, UA FEW (*)    All other components within normal limits  URINE CULTURE    EKG   RADIOLOGY CT abdomen/pelvis interpreted by me with large stool burden  Official radiology report(s): CT ABDOMEN PELVIS W CONTRAST  Result Date: 08/01/2022  CLINICAL DATA:  Bed-bound stroke patient with outpatient KUB with mild ileus. Eval for signs of small-bowel obstruction or ileus. EXAM: CT ABDOMEN AND PELVIS WITH CONTRAST TECHNIQUE: Multidetector CT imaging of the abdomen and pelvis was performed using the standard protocol following bolus administration of intravenous contrast. RADIATION DOSE REDUCTION: This exam was performed according to the departmental dose-optimization program which includes automated exposure control, adjustment of the mA and/or kV according to patient size and/or use of iterative reconstruction technique. CONTRAST:  189m OMNIPAQUE IOHEXOL 300 MG/ML  SOLN COMPARISON:  None Available. FINDINGS: Lower chest: Bibasilar scarring/atelectasis.  No acute abnormality. Hepatobiliary: No focal liver abnormality is seen. No gallstones, gallbladder wall thickening, or biliary dilatation. Pancreas: Unremarkable. Spleen: Unremarkable. Adrenals/Urinary Tract: 1.7 cm right adrenal nodule with intermediate density (Hounsfield units of 52). Normal  left adrenal gland. No urinary calculi or hydronephrosis. Unremarkable bladder. Stomach/Bowel: Moderate-large stool in the rectum and sigmoid colon with sigmoid colon and rectal wall thickening and mild adjacent stranding compatible with stercoral colitis. The small bowel and colon are normal in caliber. There is a left inguinal hernia which contains nonobstructed loops of small bowel. No bowel wall thickening. Normal appendix. There is a gas-filled tract between the anterior wall of the stomach through the abdominal wall into the skin surface (series 2/image 10/29/2036). Vascular/Lymphatic: Aortic atherosclerosis. No enlarged abdominal or pelvic lymph nodes. Reproductive: Uterus and bilateral adnexa are unremarkable. Other: Small volume free fluid in the pelvis. No free intraperitoneal air. Left inguinal hernia containing fat and nonobstructed small bowel. Musculoskeletal: No acute osseous abnormality. IMPRESSION: 1. Moderate-large stool in the rectum and sigmoid colon wall thickening compatible with proctocolitis. 2. Left inguinal hernia containing nonobstructed small bowel. 3. Gas-filled tract between the anterior wall of the stomach through the abdominal wall into the skin surface. Correlate with history of prior PEG tube. Fistulous connection is not excluded. 4. Indeterminate 1.7 cm right adrenal nodule. This is likely a benign adenoma. One year follow-up adrenal washout CT is recommended. If stable for 1 year, no further follow-up imaging is recommended. Aortic Atherosclerosis (ICD10-I70.0). Electronically Signed   By: TPlacido SouM.D.   On: 08/01/2022 00:43    PROCEDURES and INTERVENTIONS:  Procedures  Medications  lactated ringers bolus 1,000 mL (0 mLs Intravenous Stopped 08/01/22 0035)  metoCLOPramide (REGLAN) injection 10 mg (10 mg Intravenous Given 07/31/22 2329)  iohexol (OMNIPAQUE) 300 MG/ML solution 100 mL (100 mLs Intravenous Contrast Given 08/01/22 0016)  cefTRIAXone (ROCEPHIN) 1 g in sodium  chloride 0.9 % 100 mL IVPB (0 g Intravenous Stopped 08/01/22 0250)     IMPRESSION / MDM / ASSESSMENT AND PLAN / ED COURSE  I reviewed the triage vital signs and the nursing notes.  Differential diagnosis includes, but is not limited to, sepsis, SBO, perforation, stercoral colitis, constipation, cystitis  {Patient presents with symptoms of an acute illness or injury that is potentially life-threatening.  Bedbound 57year old woman presents with abdominal discomfort and abnormal outpatient KUB, with evidence of proctocolitis and possible UTI requiring observation admission.  Nonverbal with limited history.  Seems to have some abdominal discomfort and mild distention but no peritoneal features.  Blood work without leukocytosis.  Renal dysfunction around baseline.  UA with ketones suggestive of dehydration, as well as nitrite positive and leukocyte positive.  Will send this for culture and start her empirically on ceftriaxone.  CT without SBO or clear signs of ileus, but significant stool burden and signs of proctocolitis.  I consult with medicine for admission  Clinical Course as  of 08/01/22 0254  Fri Aug 01, 2022  0057 Reassessed and reexamined.  Comfortably asleep with a soft abdomen and no apparent tenderness to palpation throughout. No family present [DS]    Clinical Course User Index [DS] Vladimir Crofts, MD     FINAL CLINICAL IMPRESSION(S) / ED DIAGNOSES   Final diagnoses:  Proctocolitis     Rx / DC Orders   ED Discharge Orders     None        Note:  This document was prepared using Dragon voice recognition software and may include unintentional dictation errors.   Vladimir Crofts, MD 08/01/22 [phone]

## 2022-08-01 ENCOUNTER — Other Ambulatory Visit: Payer: Self-pay

## 2022-08-01 ENCOUNTER — Emergency Department: Payer: Medicaid Other

## 2022-08-01 DIAGNOSIS — Z79899 Other long term (current) drug therapy: Secondary | ICD-10-CM | POA: Diagnosis not present

## 2022-08-01 DIAGNOSIS — R532 Functional quadriplegia: Secondary | ICD-10-CM | POA: Diagnosis present

## 2022-08-01 DIAGNOSIS — I129 Hypertensive chronic kidney disease with stage 1 through stage 4 chronic kidney disease, or unspecified chronic kidney disease: Secondary | ICD-10-CM | POA: Diagnosis present

## 2022-08-01 DIAGNOSIS — K529 Noninfective gastroenteritis and colitis, unspecified: Secondary | ICD-10-CM

## 2022-08-01 DIAGNOSIS — Z7401 Bed confinement status: Secondary | ICD-10-CM | POA: Diagnosis not present

## 2022-08-01 DIAGNOSIS — K5289 Other specified noninfective gastroenteritis and colitis: Secondary | ICD-10-CM | POA: Diagnosis present

## 2022-08-01 DIAGNOSIS — R1310 Dysphagia, unspecified: Secondary | ICD-10-CM | POA: Diagnosis present

## 2022-08-01 DIAGNOSIS — K5130 Ulcerative (chronic) rectosigmoiditis without complications: Secondary | ICD-10-CM | POA: Diagnosis present

## 2022-08-01 DIAGNOSIS — Z66 Do not resuscitate: Secondary | ICD-10-CM | POA: Diagnosis present

## 2022-08-01 DIAGNOSIS — N390 Urinary tract infection, site not specified: Secondary | ICD-10-CM | POA: Diagnosis present

## 2022-08-01 DIAGNOSIS — G40909 Epilepsy, unspecified, not intractable, without status epilepticus: Secondary | ICD-10-CM | POA: Diagnosis not present

## 2022-08-01 DIAGNOSIS — N1831 Chronic kidney disease, stage 3a: Secondary | ICD-10-CM | POA: Diagnosis present

## 2022-08-01 DIAGNOSIS — G8929 Other chronic pain: Secondary | ICD-10-CM | POA: Diagnosis present

## 2022-08-01 DIAGNOSIS — I69391 Dysphagia following cerebral infarction: Secondary | ICD-10-CM | POA: Diagnosis not present

## 2022-08-01 DIAGNOSIS — G40409 Other generalized epilepsy and epileptic syndromes, not intractable, without status epilepticus: Secondary | ICD-10-CM | POA: Diagnosis present

## 2022-08-01 DIAGNOSIS — Z931 Gastrostomy status: Secondary | ICD-10-CM | POA: Diagnosis not present

## 2022-08-01 DIAGNOSIS — G8220 Paraplegia, unspecified: Secondary | ICD-10-CM | POA: Diagnosis not present

## 2022-08-01 DIAGNOSIS — N3000 Acute cystitis without hematuria: Secondary | ICD-10-CM | POA: Diagnosis not present

## 2022-08-01 DIAGNOSIS — I69351 Hemiplegia and hemiparesis following cerebral infarction affecting right dominant side: Secondary | ICD-10-CM | POA: Diagnosis not present

## 2022-08-01 DIAGNOSIS — Z1152 Encounter for screening for COVID-19: Secondary | ICD-10-CM | POA: Diagnosis not present

## 2022-08-01 DIAGNOSIS — E162 Hypoglycemia, unspecified: Secondary | ICD-10-CM | POA: Diagnosis present

## 2022-08-01 DIAGNOSIS — R569 Unspecified convulsions: Secondary | ICD-10-CM | POA: Diagnosis not present

## 2022-08-01 DIAGNOSIS — G2582 Stiff-man syndrome: Secondary | ICD-10-CM | POA: Diagnosis present

## 2022-08-01 LAB — GLUCOSE, CAPILLARY: Glucose-Capillary: 143 mg/dL — ABNORMAL HIGH (ref 70–99)

## 2022-08-01 MED ORDER — LACTATED RINGERS IV SOLN: INTRAVENOUS | Status: DC

## 2022-08-01 MED ORDER — ENSURE ENLIVE PO LIQD
237.0000 mL | Freq: Three times a day (TID) | ORAL | Status: DC
  Administered 2022-08-01 – 2022-08-04 (×5): 237 mL via ORAL

## 2022-08-01 MED ORDER — METOPROLOL TARTRATE 25 MG PO TABS
25.0000 mg | ORAL_TABLET | Freq: Two times a day (BID) | ORAL | Status: DC
  Administered 2022-08-02 – 2022-08-04 (×5): 25 mg via ORAL
  Filled 2022-08-01 (×6): qty 1

## 2022-08-01 MED ORDER — BISACODYL 10 MG RE SUPP: 10.0000 mg | RECTAL | Status: DC | PRN

## 2022-08-01 MED ORDER — ACETAMINOPHEN 325 MG PO TABS: 650.0000 mg | ORAL_TABLET | Freq: Four times a day (QID) | ORAL | Status: DC | PRN

## 2022-08-01 MED ORDER — ONDANSETRON HCL 4 MG PO TABS
4.0000 mg | ORAL_TABLET | Freq: Three times a day (TID) | ORAL | Status: DC
  Administered 2022-08-02 – 2022-08-04 (×4): 4 mg via ORAL
  Filled 2022-08-01 (×5): qty 1

## 2022-08-01 MED ORDER — ZONISAMIDE 100 MG PO CAPS
100.0000 mg | ORAL_CAPSULE | Freq: Two times a day (BID) | ORAL | Status: DC
  Administered 2022-08-02 – 2022-08-04 (×5): 100 mg via ORAL
  Filled 2022-08-01 (×7): qty 1

## 2022-08-01 MED ORDER — ACETAMINOPHEN 325 MG RE SUPP: 650.0000 mg | Freq: Four times a day (QID) | RECTAL | Status: DC | PRN

## 2022-08-01 MED ORDER — LORAZEPAM 2 MG/ML IJ SOLN
2.0000 mg | Freq: Once | INTRAMUSCULAR | Status: AC
  Administered 2022-08-01: 2 mg via INTRAVENOUS

## 2022-08-01 MED ORDER — LEVETIRACETAM IN NACL 500 MG/100ML IV SOLN
500.0000 mg | Freq: Two times a day (BID) | INTRAVENOUS | Status: DC
  Filled 2022-08-01: qty 100

## 2022-08-01 MED ORDER — SODIUM CHLORIDE 0.9 % IV SOLN: INTRAVENOUS | Status: AC

## 2022-08-01 MED ORDER — OXYCODONE HCL 5 MG PO TABS: 5.0000 mg | ORAL_TABLET | ORAL | Status: DC | PRN

## 2022-08-01 MED ORDER — SODIUM CHLORIDE 0.9 % IV SOLN
1.0000 g | Freq: Once | INTRAVENOUS | Status: AC
  Administered 2022-08-01: 1 g via INTRAVENOUS
  Filled 2022-08-01: qty 10

## 2022-08-01 MED ORDER — ATORVASTATIN CALCIUM 20 MG PO TABS
40.0000 mg | ORAL_TABLET | Freq: Every day | ORAL | Status: DC
  Administered 2022-08-02 – 2022-08-04 (×3): 40 mg via ORAL
  Filled 2022-08-01 (×3): qty 2

## 2022-08-01 MED ORDER — SODIUM CHLORIDE 0.9 % IV SOLN
1.0000 g | INTRAVENOUS | Status: DC
  Administered 2022-08-02 – 2022-08-04 (×3): 1 g via INTRAVENOUS
  Filled 2022-08-01 (×3): qty 10

## 2022-08-01 MED ORDER — ONDANSETRON HCL 4 MG/2ML IJ SOLN
4.0000 mg | Freq: Three times a day (TID) | INTRAMUSCULAR | Status: DC
  Administered 2022-08-01 – 2022-08-04 (×5): 4 mg via INTRAVENOUS
  Filled 2022-08-01 (×6): qty 2

## 2022-08-01 MED ORDER — ONDANSETRON HCL 4 MG/2ML IJ SOLN: 4.0000 mg | Freq: Four times a day (QID) | INTRAMUSCULAR | Status: DC | PRN

## 2022-08-01 MED ORDER — DIVALPROEX SODIUM 125 MG PO CSDR
250.0000 mg | DELAYED_RELEASE_CAPSULE | Freq: Two times a day (BID) | ORAL | Status: DC
  Administered 2022-08-02 – 2022-08-04 (×5): 250 mg via ORAL
  Filled 2022-08-01 (×7): qty 2

## 2022-08-01 MED ORDER — ADULT MULTIVITAMIN W/MINERALS CH
1.0000 | ORAL_TABLET | Freq: Every day | ORAL | Status: DC
  Administered 2022-08-02 – 2022-08-04 (×3): 1 via ORAL
  Filled 2022-08-01 (×3): qty 1

## 2022-08-01 MED ORDER — METOPROLOL TARTRATE 5 MG/5ML IV SOLN: 5.0000 mg | Freq: Four times a day (QID) | INTRAVENOUS | Status: DC | PRN

## 2022-08-01 MED ORDER — ENOXAPARIN SODIUM 40 MG/0.4ML IJ SOSY
40.0000 mg | PREFILLED_SYRINGE | INTRAMUSCULAR | Status: DC
  Administered 2022-08-01 – 2022-08-04 (×4): 40 mg via SUBCUTANEOUS
  Filled 2022-08-01 (×4): qty 0.4

## 2022-08-01 MED ORDER — ONDANSETRON HCL 4 MG PO TABS: 4.0000 mg | ORAL_TABLET | Freq: Four times a day (QID) | ORAL | Status: DC | PRN

## 2022-08-01 MED ORDER — LISINOPRIL 10 MG PO TABS
20.0000 mg | ORAL_TABLET | Freq: Every day | ORAL | Status: DC
  Administered 2022-08-02 – 2022-08-04 (×3): 20 mg via ORAL
  Filled 2022-08-01 (×3): qty 2

## 2022-08-01 MED ORDER — IOHEXOL 300 MG/ML  SOLN
100.0000 mL | Freq: Once | INTRAMUSCULAR | Status: AC | PRN
  Administered 2022-08-01: 100 mL via INTRAVENOUS

## 2022-08-01 MED ORDER — PANTOPRAZOLE SODIUM 40 MG PO TBEC
40.0000 mg | DELAYED_RELEASE_TABLET | Freq: Two times a day (BID) | ORAL | Status: DC
  Administered 2022-08-02 (×2): 40 mg via ORAL
  Filled 2022-08-01 (×4): qty 1

## 2022-08-01 MED ORDER — LORAZEPAM 0.5 MG PO TABS: 0.5000 mg | ORAL_TABLET | Freq: Four times a day (QID) | ORAL | Status: DC | PRN

## 2022-08-01 MED ORDER — SENNA 8.6 MG PO TABS
2.0000 | ORAL_TABLET | Freq: Two times a day (BID) | ORAL | Status: DC
  Administered 2022-08-01 – 2022-08-04 (×6): 17.2 mg via ORAL
  Filled 2022-08-01 (×6): qty 2

## 2022-08-01 MED ORDER — LEVETIRACETAM IN NACL 1500 MG/100ML IV SOLN
1500.0000 mg | Freq: Once | INTRAVENOUS | Status: AC
  Administered 2022-08-01: 1500 mg via INTRAVENOUS
  Filled 2022-08-01: qty 100

## 2022-08-01 MED ORDER — DULOXETINE HCL 20 MG PO CPEP
40.0000 mg | ORAL_CAPSULE | Freq: Two times a day (BID) | ORAL | Status: DC
  Administered 2022-08-02 – 2022-08-03 (×3): 40 mg via ORAL
  Filled 2022-08-01 (×8): qty 2

## 2022-08-01 MED ORDER — LACOSAMIDE 50 MG PO TABS
200.0000 mg | ORAL_TABLET | Freq: Two times a day (BID) | ORAL | Status: DC
  Administered 2022-08-02 – 2022-08-04 (×5): 200 mg via ORAL
  Filled 2022-08-01 (×6): qty 4

## 2022-08-01 MED ORDER — LORAZEPAM 2 MG/ML IJ SOLN
INTRAMUSCULAR | Status: AC
  Filled 2022-08-01: qty 1

## 2022-08-01 MED ORDER — BACLOFEN 10 MG PO TABS
5.0000 mg | ORAL_TABLET | Freq: Four times a day (QID) | ORAL | Status: DC
  Administered 2022-08-02 – 2022-08-04 (×10): 5 mg via ORAL
  Filled 2022-08-01 (×11): qty 1

## 2022-08-01 MED ORDER — LACTULOSE 10 GM/15ML PO SOLN
20.0000 g | Freq: Two times a day (BID) | ORAL | Status: DC
  Administered 2022-08-02 – 2022-08-04 (×5): 20 g via ORAL
  Filled 2022-08-01 (×6): qty 30

## 2022-08-01 MED ORDER — SORBITOL 70 % SOLN
960.0000 mL | TOPICAL_OIL | Freq: Once | ORAL | Status: AC
  Administered 2022-08-01: 960 mL via RECTAL
  Filled 2022-08-01 (×2): qty 240

## 2022-08-01 NOTE — Progress Notes (Signed)
Patient is non-verbal, the [facility] is unable to obtain information for admission.

## 2022-08-01 NOTE — Evaluation (Addendum)
Clinical/Bedside Swallow Evaluation Patient Details  Name: Katherine Fields MRN: [id number] Date of Birth: 09-Mar-1966  Today's Date: 08/01/2022 Time: SLP Start Time (ACUTE ONLY): 0850 SLP Stop Time (ACUTE ONLY): 0950 SLP Time Calculation (min) (ACUTE ONLY): 60 min  Past Medical History:  Past Medical History:  Diagnosis Date   Chronic pain    G tube feedings ([place])    ICH (intracerebral hemorrhage) ([place])    Protein calorie malnutrition ([place])    Stiffman syndrome    Past Surgical History: No past surgical history on file. HPI:  Pt  is a 57 y.o. 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  Patient was noted to have abdominal distention and discomfort and an abdominal x-ray done at the facility was concerning for ileus.  Abd. scan at admit: "Lower chest: Bibasilar scarring/atelectasis.  No acute abnormality.".    Assessment / Plan / Recommendation  Clinical Impression   Pt seen for BSE today. Pt awake, eyes opened. Pt nodded to communicate to Y/N questions of her wants/needs to Dtr and this SLP. Nonverbal at baseline. Pt initially did not participate in taking any po's but when Daughter arrived, pt drank tea(thins) and took po's offered. Per Imaging, pt does have large amount of stool; MD is addressing w/ bowel regimen.  Pt on RA, afebrile, WBC WNL.   Pt appears to present w/ grossly functional oropharyngeal phase swallow w/ a modified diet consistency (Puree Food consistency as is her Baseline) w/ No gross oropharyngeal phase dysphagia noted, No overt clinical s/s of aspiration. Pt consumed po trials fed to her by her Daughter w/ No overt, clinical s/s of aspiration during po trials and no decline in status during/post po intake.  Pt appears at reduced risk for aspiration  following general aspiration precautions w/ a modified diet consistency (Puree Foods).   However, pt does have challenging factors that could impact her oropharyngeal swallowing to include acute Moderate-large stool burden, weakness, prior CVA/dysphagia and on a Pureed diet (food consistency) w/ thin liquids at NH, feeding dependency d/t stiff person syndrome, and Nonverbal status w/ cognitive-language deficits. These factors can increase risk for aspiration, dysphagia as well as decreased oral intake overall.  Daughter reported pt has been tolerating a Puree diet w/ thin liquids at the NH at Baseline w/ no deficits.   During po trials, pt consumed consistencies of ice chips, thin liquids via straw, and Purees w/ no overt clinical s/s of aspiration noted: No coughing, decline in respiratory presentation during/post trials. No audible, multiple swallows. Oral phase appeared grossly [facility] w/ fairly timely bolus management and oral clearing -- intermittent slower bolus manipulation and oral clearing but functional overall. Pt exhibited frequent lingual sweeping and licking of lips to clear orally. Oral clearing achieved w/ all trial consistencies -- encouraged alternating foods/liquids.  OM Exam appeared [facility] during cursory assessment of pt's bolus management and licking lips to clear orally. No unilateral weakness noted. Pt requires full feeding support.   Recommend a Puree diet consistency(dysphagia level 1) w/ moistened foods; Thin liquids -- monitor straw use, and pt should help to Hold Cup when drinking if able. Recommend general aspiration precautions, give time for oral clearing. Feeding Support at all meals. Pills CRUSHED in Puree for safer, easier swallowing.   Education given on Pills in Puree; food consistencies and easy to eat  options; general aspiration precautions to pt and Dtr.  Pt appears at her Baseline re: swallowing and her diet consistency tolerance. MD updated and will reconsult if any new  needs arise. NSG updated, agreed. Recommend Dietician f/u for support. Precautions posted in room.  SLP Visit Diagnosis: Dysphagia, oral phase (R13.11) (at her Baseline per Dtr)    Aspiration Risk  Mild aspiration risk;Risk for inadequate nutrition/hydration    Diet Recommendation   a Puree diet consistency(dysphagia level 1) w/ moistened foods; Thin liquids -- monitor straw use, and pt should help to Hold Cup when drinking if able. Recommend general aspiration precautions, give time for oral clearing. Feeding Support at all meals.   Medication Administration: Crushed with puree    Other  Recommendations Recommended Consults:  (Dietician f/u) Oral Care Recommendations: Oral care BID;Oral care before and after PO;Staff/trained caregiver to provide oral care    Recommendations for follow up therapy are one component of a multi-disciplinary discharge planning process, led by the attending physician.  Recommendations may be updated based on patient status, additional functional criteria and insurance authorization.  Follow up Recommendations No SLP follow up      Assistance Recommended at Discharge  Full at meals - baseline  Functional Status Assessment Patient has not had a recent decline in their functional status  Frequency and Duration  (n/a)   (n/a)       Prognosis Prognosis for improved oropharyngeal function: Fair Barriers to Reach Goals: Cognitive deficits;Language deficits;Time post onset;Severity of deficits;Behavior Barriers/Prognosis Comment: baseline Cognitive-language deficits; Nonverbal s/p old CVA. Dependent for feeding.      Swallow Study   General Date of Onset: 07/31/22 HPI: Pt  is a 57 y.o. 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  Patient was  noted to have abdominal distention and discomfort and an abdominal x-ray done at the facility was concerning for ileus.  Abd. scan at admit: "Lower chest: Bibasilar scarring/atelectasis.  No acute abnormality.". Type of Study: Bedside Swallow Evaluation Previous Swallow Assessment: BSE in 2018 Diet Prior to this Study: Dysphagia 1 (pureed);Thin liquids (Level 0) (per Dtr.) Temperature Spikes Noted: No (wbc 4.2) Respiratory Status: Room air History of Recent Intubation: No Behavior/Cognition: Alert;Cooperative;Pleasant mood;Requires cueing (Nonverbal) Oral Cavity Assessment: Within Functional Limits (limited view) Oral Care Completed by SLP: Recent completion by staff Oral Cavity - Dentition: Edentulous Self-Feeding Abilities: Total assist Patient Positioning: Upright in bed (needed positioning) Baseline Vocal Quality:  (Nonverbal) Volitional Cough: Cognitively unable to elicit Volitional Swallow: Unable to elicit    Oral/Motor/Sensory Function Overall Oral Motor/Sensory Function: Within functional limits (w/ bolus management and oral clearing -- no unilateral weakness noted)   Ice Chips Ice chips: Within functional limits Presentation: Spoon (fed; 2 trials) Other Comments: min longer oral phase but mostly not overaly engaged   Thin Liquid Thin Liquid: Within functional limits Presentation: Straw (fed; ~6+ ozs) Other Comments: water, tea, sprite    Nectar Thick Nectar Thick Liquid: Not tested   Honey Thick Honey Thick Liquid: Not tested   Puree Puree: Within functional limits Presentation: Spoon (fed; 10+ trials) Other Comments: Dtr fed for better engagement; min slower overall but functional   Solid     Solid: Not tested Other Comments: not her baseline         Orinda Kenner, [employer], Camera operator Rehab  Services; [place] (ascom) Tawana Pasch 08/01/2022,2:03 PM

## 2022-08-01 NOTE — Progress Notes (Signed)
The [facility] is about to give the schedule medicine of the patient and put her into chair bed position, patient started to get stiff and having some kind of hiccups, The author hook her into pulse oximeter, oxygen saturation 100%, PR is 150's, blood sugar 110mL, suction done orally since secretions noted, Charged Nurse called for assistance, NP B. MRandol Kernnotified and rapid response team also called. Ordered made by NP. BRachael Fee

## 2022-08-01 NOTE — Plan of Care (Signed)

## 2022-08-01 NOTE — IPAL (Signed)
  Interdisciplinary Goals of Care Family Meeting   Date carried out: 08/01/2022  Location of the meeting: Phone conference  Member's involved: Physician and Family Member or next of kin, daughter Iara Kesling Power of Attorney or acting medical decision maker: daughter Mirian Caissie    Discussion: We discussed goals of care for [employer] .   I have reviewed medical records including EPIC notes, labs and imaging, assessed the patient and then met with daughter via phone to discuss major active diagnoses, plan of care, natural trajectory, prognosis, GOC, EOL wishes, disposition and options including Full code/DNI/DNR and the concept of comfort care if DNR is elected. Questions and concerns were addressed. They are in agreement to continue current plan of care . Election for DNR status.   Code status:   Code Status: DNR   Disposition: Continue current acute care  Time spent for the meeting: [place], MD  08/01/2022, 4:08 AM

## 2022-08-01 NOTE — Progress Notes (Addendum)
       CROSS COVER NOTE  NAME: Taiya Bogus MRN: [id number] DOB : 1966/01/26    HPI/Events of Note   Rapid response called for seizure like activity and elevated heart rate  Assessment and  Interventions   Assessment: Patient with tonic clonic seizure. Has history of seizures and vimpat keppra  and xonisamide on home meds for seizure control. None given since admit Plan: 2 mg IV ativan ordered and given - seizure activity abated, vitals improving Keprra loading dose 1500 f/b 500 bid - switch to home oral regimen when taking po Resume home vimpat and zonisamide in am Seizure precautions Consider neuro consult in am Prn metoprolol for heart rate sustained above 120 Cardiac telemetry Keppra and depakote level as well as mag added to am labs        Kathlene Cote NP [facility]

## 2022-08-01 NOTE — Progress Notes (Signed)
   08/01/22 2200  Spiritual Encounters  Type of Visit Initial  Referral source Code page  Reason for visit Code  OnCall Visit Yes   Chaplain responded to RRT page. No family present. Patient receiving care.

## 2022-08-01 NOTE — Progress Notes (Signed)
PROGRESS NOTE  Katherine Fields    DOB: 1966-01-26, 57 y.o.  [id number]    Code Status: DNR   DOA: 07/31/2022   LOS: 0   Brief hospital course  Katherine Fields is a 57 y.o. 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  They presented from SNF to the ED on 07/31/2022 with abdominal pain x several days. Patient is nonverbal and unable to give history. Her daughter reports that they sent patient to ED because it had been several days without BM and appeared to have abdominal pain. There was an xray at the facility which suggested ileus.   In the ED, it was found that they had stable vital signs.  Significant findings included WBC normal at 4200, hemoglobin 11.7.  Creatinine at baseline.  Urinalysis with positive nitrites and trace leukocyte esterase with few bacteria. CT abdomen and pelvis consistent with proctocolitis.  They were initially treated with CTX for UTI and given reglan.   Patient was admitted to medicine service for further workup and management of proctocolitis and UTI as outlined in detail below.  08/01/22 -uncomfortable, poor PO intake, likely from early satiety, nausea and pain  Assessment & Plan  Principal Problem:   Urinary tract infection Active Problems:   Stercoral colitis   Seizures (HCC)   Epilepsy, generalized, convulsive ([place])   Stiffman syndrome   Essential hypertension   Functional quadriplegia (HCC)   G tube feedings (HCC)   Hemiparesis affecting dominant side as late effect of cerebrovascular accident [facility])   Dysphagia following cerebral infarction   Gastrostomy in place [facility])  UTI (urinary tract infection) -  Rocephin 1 g IV daily - f/u UxCx - strict I/O   Proctocolitis- likely large stool burden is causing pain and poor PO intake - aggressive bowel regimen as ordered - scheduled  antiemetics as she is nonverbal - low threshold to get general surgery on board if not improving - IV hydration while not having good PO intake - frequent abdominal exams to assess for progression,  - re-image if acute worsening   Functional quadriplegia s/p CVA - PT/OT/SLP - frequent turning to prevent skin breakdown - assist with feeding   Essential hypertension Continue lisinopril and metoprolol    Chronic pain Continue Cymbalta and oxycodone   Stiffman syndrome Continue baclofen   Dysphagia Secondary to stroke Patient is on a pured diet and will require assistance with all meals   Seizure (HCC) Continue Vimpat, Keppra and zonisamide  Body mass index is 23.98 kg/m.  VTE ppx: enoxaparin (LOVENOX) injection 40 mg Start: 08/01/22 0800  Diet:     Diet   DIET - DYS 1 Room service appropriate? No; Fluid consistency: Thin   Consultants: None   Subjective 08/01/22    Pt reports abdominal pain with palpation by grimacing and attempting to withdraw from painful stimuli.   Objective   Vitals:   08/01/22 0300 08/01/22 0408 08/01/22 0733 08/01/22 1602  BP: (!) 121/53 (!) 144/78 (!) 157/73 138/72  Pulse: 62 73 75 88  Resp: '16 18 16 16  '$ Temp:  98 F (36.7 C) 98.1 F (36.7 C) 98.3 F (36.8 C)  TempSrc:      SpO2: 96% 100% 100% 98%  Weight:        Intake/Output Summary (Last 24 hours) at 08/01/2022 1737 Last data filed at 08/01/2022 1531 Gross per 24 hour  Intake 1063.74 ml  Output --  Net 1063.74 ml   Filed Weights   07/31/22 2255  Weight: 67.4 kg    Physical Exam:  General: awake, alert, NAD at rest HEENT: atraumatic, clear conjunctiva, anicteric sclera, MMM Respiratory: normal respiratory effort. CTAB Cardiovascular: quick capillary refill, normal S1/S2, RRR, no JVD, murmurs Gastrointestinal: mildly firm. Prior surgical scars healed. No rash. Tender diffusely. Decreased BS.  Nervous: Alert. No spontaneous movement of limbs.  Extremities:  no  edema Skin: dry, intact, normal temperature, normal color. No rashes, lesions or ulcers on exposed skin  Labs   I have personally reviewed the following labs and imaging studies CBC    Component Value Date/Time   WBC 4.2 07/31/2022 2326   RBC 3.26 (L) 07/31/2022 2326   HGB 11.7 (L) 07/31/2022 2326   HGB 8.7 (L) 10/31/2011 1439   HCT 35.4 (L) 07/31/2022 2326   HCT 28.6 (L) 10/31/2011 1439   PLT 237 07/31/2022 2326   PLT 331 10/31/2011 1439   MCV 108.6 (H) 07/31/2022 2326   MCV 72 (L) 10/31/2011 1439   MCH 35.9 (H) 07/31/2022 2326   MCHC 33.1 07/31/2022 2326   RDW 14.8 07/31/2022 2326   RDW 20.5 (H) 10/31/2011 1439   LYMPHSABS 2.1 07/31/2022 2326   MONOABS 0.4 07/31/2022 2326   EOSABS 0.0 07/31/2022 2326   BASOSABS 0.0 07/31/2022 2326   BASOSABS 1 10/21/2011 2219      Latest Ref Rng & Units 07/31/2022   11:26 PM 07/07/2022   11:45 AM 05/10/2022    4:47 AM  BMP  Glucose 70 - 99 mg/dL 83  71  52   BUN 6 - 20 mg/dL '22  25  23   '$ Creatinine 0.44 - 1.00 mg/dL 1.24  1.32  1.05   Sodium 135 - 145 mmol/L 138  140  136   Potassium 3.5 - 5.1 mmol/L 4.6  4.3  3.5   Chloride 98 - 111 mmol/L 104  105  107   CO2 22 - 32 mmol/L '24  28  24   '$ Calcium 8.9 - 10.3 mg/dL 9.2  9.6  9.0     CT ABDOMEN PELVIS W CONTRAST  Result Date: 08/01/2022 CLINICAL DATA:  Bed-bound stroke patient with outpatient KUB with mild ileus. Eval for signs of small-bowel obstruction or ileus. EXAM: CT ABDOMEN AND PELVIS WITH CONTRAST TECHNIQUE: Multidetector CT imaging of the abdomen and pelvis was performed using the standard protocol following bolus administration of intravenous contrast. RADIATION DOSE REDUCTION: This exam was performed according to the departmental dose-optimization program which includes automated exposure control, adjustment of the mA and/or kV according to patient size and/or use of iterative reconstruction technique. CONTRAST:  147m OMNIPAQUE IOHEXOL 300 MG/ML  SOLN COMPARISON:  None Available.  FINDINGS: Lower chest: Bibasilar scarring/atelectasis.  No acute abnormality. Hepatobiliary: No focal liver abnormality is seen. No gallstones, gallbladder wall thickening, or biliary dilatation. Pancreas: Unremarkable. Spleen: Unremarkable. Adrenals/Urinary Tract: 1.7 cm right adrenal nodule with intermediate density (Hounsfield units of 52). Normal left adrenal gland. No urinary calculi or hydronephrosis. Unremarkable bladder. Stomach/Bowel: Moderate-large stool in the rectum and sigmoid colon with sigmoid colon and rectal wall thickening and mild adjacent stranding compatible with stercoral colitis. The small bowel and colon are normal in caliber. There is a left inguinal hernia which contains nonobstructed loops of small bowel. No bowel wall thickening. Normal appendix. There is a gas-filled tract between the anterior wall of the stomach through the abdominal wall into the skin surface (series  2/image 10/29/2036). Vascular/Lymphatic: Aortic atherosclerosis. No enlarged abdominal or pelvic lymph nodes. Reproductive: Uterus and bilateral adnexa are unremarkable. Other: Small volume free fluid in the pelvis. No free intraperitoneal air. Left inguinal hernia containing fat and nonobstructed small bowel. Musculoskeletal: No acute osseous abnormality. IMPRESSION: 1. Moderate-large stool in the rectum and sigmoid colon wall thickening compatible with proctocolitis. 2. Left inguinal hernia containing nonobstructed small bowel. 3. Gas-filled tract between the anterior wall of the stomach through the abdominal wall into the skin surface. Correlate with history of prior PEG tube. Fistulous connection is not excluded. 4. Indeterminate 1.7 cm right adrenal nodule. This is likely a benign adenoma. One year follow-up adrenal washout CT is recommended. If stable for 1 year, no further follow-up imaging is recommended. Aortic Atherosclerosis (ICD10-I70.0). Electronically Signed   By: Placido Sou M.D.   On: 08/01/2022 00:43     Disposition Plan & Communication  Patient status: Inpatient  Admitted From: SNF Planned disposition location: Skilled nursing facility Anticipated discharge date: 3/3 pending bowel clean out and able to tolerate PO  Family Communication: daughter on phone    Author: Richarda Osmond, DO [facility] 08/01/2022, 5:37 PM   Available by Epic secure chat 7AM-7PM. If 7PM-7AM, please contact night-coverage.  [facility] contact information found on [email or web address].

## 2022-08-01 NOTE — H&P (Addendum)
History and Physical    Patient: Katherine Fields DOB: 09-02-1965 DOA: 07/31/2022 DOS: the patient was seen and examined on 08/01/2022 PCP: Gearldine Shown, DO  Patient coming from: Home  Chief Complaint:  Chief Complaint  Patient presents with   Abdominal Pain    HPI: Katherine Fields is a 57 y.o. 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  Patient was noted to have abdominal distention and discomfort and an abdominal x-ray done at the facility was concerning for ileus.  History is limited as patient is unable to communicate. ED course and data review: Vitals on arrival within normal limits.  WBC normal at 4200, hemoglobin 11.7.  Creatinine at baseline.  Urinalysis with positive nitrites and trace leukocyte esterase with few bacteria. CT abdomen and pelvis consistent with proctocolitis among other findings as outlined below: IMPRESSION: 1. Moderate-large stool in the rectum and sigmoid colon wall thickening compatible with proctocolitis. 2. Left inguinal hernia containing nonobstructed small bowel. 3. Gas-filled tract between the anterior wall of the stomach through the abdominal wall into the skin surface. Correlate with history of prior PEG tube. Fistulous connection is not excluded. 4. Indeterminate 1.7 cm right adrenal nodule. This is likely a benign adenoma. One year follow-up adrenal washout CT is recommended. If stable for 1 year, no further follow-up imaging is recommended.  Patient started on ceftriaxone given a dose of Reglan and hospitalist consulted for admission.     Past Medical History:  Diagnosis Date   Chronic pain    G tube feedings ([place])    ICH (intracerebral hemorrhage) ([place])    Protein calorie malnutrition ([place])     Stiffman syndrome    No past surgical history on file. Social History:  reports that she has never smoked. She has never used smokeless tobacco. She reports that she does not drink alcohol and does not use drugs.  Allergies  Allergen Reactions   Tramadol     Other reaction(s): Unknown    Family History  Problem Relation Age of Onset   Seizures Father     Prior to Admission medications   Medication Sig Start Date End Date Taking? Authorizing Provider  atorvastatin (LIPITOR) 40 MG tablet Take 40 mg by mouth daily.    [provider]  baclofen (LIORESAL) 10 MG tablet Take 0.5 tablets (5 mg total) by mouth every 6 (six) hours. 09/30/16   Max Sane, MD  bisacodyl (DULCOLAX) 10 MG suppository Place 10 mg rectally as needed for moderate constipation.    [provider]  DULoxetine (CYMBALTA) 30 MG capsule Take 40 mg by mouth 2 (two) times daily.    [provider]  erythromycin ophthalmic ointment Place 1 Application into both eyes at bedtime. 04/01/22   [provider]  hydrOXYzine (ATARAX) 25 MG tablet Take 25 mg by mouth 3 (three) times daily. Patient not taking: Reported on 05/09/2022 11/26/21   [provider]  lacosamide (VIMPAT) 10 MG/ML oral solution Take 200 mg by mouth 2 (two) times daily.  Patient not taking: Reported on 05/09/2022    [provider]  lacosamide (VIMPAT) 200 MG TABS tablet Take 200 mg by mouth 2 (two) times daily.    [provider]  lactulose (CHRONULAC) 10 GM/15ML solution Take 30 mLs by mouth 2 (two) times daily.    [provider]  levETIRAcetam (KEPPRA) 100 MG/ML solution Take 10 mLs (1,000 mg total) by mouth 2 (two) times daily. 09/30/16   Max Sane, MD  lisinopril (ZESTRIL) 20 MG tablet Take 20 mg by mouth daily.    [provider]  LORazepam (ATIVAN) 0.5 MG tablet Take 0.5 mg by mouth every 6 (six) hours as needed for anxiety.    [provider]  LORazepam (ATIVAN) 2  MG/ML concentrated solution Take 0.3 mLs (0.6 mg total) by mouth every 8 (eight) hours as needed for anxiety. 09/30/16   Max Sane, MD  metoprolol tartrate (LOPRESSOR) 25 MG tablet Take 25 mg by mouth 2 (two) times daily.    [provider]  oxycodone (OXY-IR) 5 MG capsule Take 5 mg by mouth every 4 (four) hours as needed for pain.    [provider]  pantoprazole (PROTONIX) 40 MG tablet Take 40 mg by mouth 2 (two) times daily. 05/08/22   [provider]  promethazine (PHENERGAN) 25 MG/ML injection Inject 25 mg into the muscle every 4 (four) hours as needed for vomiting or nausea.    [provider]  senna (SENOKOT) 8.6 MG TABS tablet Take 2 tablets by mouth 2 (two) times daily.    [provider]  trimethoprim-polymyxin b (POLYTRIM) ophthalmic solution Place 1 drop into both eyes every 6 (six) hours. 04/01/22   [provider]  zonisamide (ZONEGRAN) 100 MG capsule Take 1 capsule (100 mg total) by mouth 2 (two) times daily. 09/30/16   Max Sane, MD    Physical Exam: Vitals:   07/31/22 2300 08/01/22 0100 08/01/22 0200 08/01/22 0300  BP: 108/73 114/67 (!) 140/103 (!) 121/53  Pulse: 66 65 65 62  Resp:   18 16  Temp:      TempSrc:      SpO2: 100% 99% 100% 96%  Weight:       Physical Exam Vitals and nursing note reviewed.  Constitutional:      General: She is not in acute distress.    Comments: Patient is aphasic  HENT:     Head: Normocephalic and atraumatic.  Cardiovascular:     Rate and Rhythm: Normal rate and regular rhythm.     Heart sounds: Normal heart sounds.  Pulmonary:     Effort: Pulmonary effort is normal.     Breath sounds: Normal breath sounds.  Abdominal:     Palpations: Abdomen is soft.     Tenderness: There is no abdominal tenderness.  Neurological:     Mental Status: Mental status is at baseline.     Labs on Admission: I have personally reviewed following labs and imaging studies  CBC: Recent Labs  Lab  07/31/22 2326  WBC 4.2  NEUTROABS 1.6*  HGB 11.7*  HCT 35.4*  MCV 108.6*  PLT [id number]   Basic Metabolic Panel: Recent Labs  Lab 07/31/22 2326  NA 138  K 4.6  CL 104  CO2 24  GLUCOSE 83  BUN 22*  CREATININE 1.24*  CALCIUM 9.2   GFR: Estimated Creatinine Clearance: 48 mL/min (A) (by C-G formula based on SCr of 1.24 mg/dL (H)). Liver Function Tests: No results for input(s): "AST", "ALT", "ALKPHOS", "BILITOT", "PROT", "ALBUMIN" in the last 168 hours. No results for input(s): "LIPASE", "AMYLASE" in the last 168 hours. No results for input(s): "AMMONIA" in the last 168 hours. Coagulation Profile: No results for input(s): "INR", "PROTIME" in the last 168 hours. Cardiac Enzymes: No results for input(s): "CKTOTAL", "CKMB", "CKMBINDEX", "TROPONINI" in the  last 168 hours. BNP (last 3 results) No results for input(s): "PROBNP" in the last 8760 hours. HbA1C: No results for input(s): "HGBA1C" in the last 72 hours. CBG: No results for input(s): "GLUCAP" in the last 168 hours. Lipid Profile: No results for input(s): "CHOL", "HDL", "LDLCALC", "TRIG", "CHOLHDL", "LDLDIRECT" in the last 72 hours. Thyroid Function Tests: No results for input(s): "TSH", "T4TOTAL", "FREET4", "T3FREE", "THYROIDAB" in the last 72 hours. Anemia Panel: No results for input(s): "VITAMINB12", "FOLATE", "FERRITIN", "TIBC", "IRON", "RETICCTPCT" in the last 72 hours. Urine analysis:    Component Value Date/Time   COLORURINE AMBER (A) 07/31/2022 2342   APPEARANCEUR HAZY (A) 07/31/2022 2342   APPEARANCEUR Hazy 10/31/2011 1439   LABSPEC 1.025 07/31/2022 2342   LABSPEC 1.018 10/31/2011 1439   PHURINE 5.0 07/31/2022 2342   GLUCOSEU NEGATIVE 07/31/2022 2342   GLUCOSEU Negative 10/31/2011 1439   HGBUR NEGATIVE 07/31/2022 2342   BILIRUBINUR NEGATIVE 07/31/2022 2342   BILIRUBINUR Negative 10/31/2011 1439   KETONESUR 5 (A) 07/31/2022 2342   PROTEINUR NEGATIVE 07/31/2022 2342   UROBILINOGEN 0.2 11/16/2007 0926    NITRITE POSITIVE (A) 07/31/2022 2342   LEUKOCYTESUR TRACE (A) 07/31/2022 2342   LEUKOCYTESUR Negative 10/31/2011 1439    Radiological Exams on Admission: CT ABDOMEN PELVIS W CONTRAST  Result Date: 08/01/2022 CLINICAL DATA:  Bed-bound stroke patient with outpatient KUB with mild ileus. Eval for signs of small-bowel obstruction or ileus. EXAM: CT ABDOMEN AND PELVIS WITH CONTRAST TECHNIQUE: Multidetector CT imaging of the abdomen and pelvis was performed using the standard protocol following bolus administration of intravenous contrast. RADIATION DOSE REDUCTION: This exam was performed according to the departmental dose-optimization program which includes automated exposure control, adjustment of the mA and/or kV according to patient size and/or use of iterative reconstruction technique. CONTRAST:  15m OMNIPAQUE IOHEXOL 300 MG/ML  SOLN COMPARISON:  None Available. FINDINGS: Lower chest: Bibasilar scarring/atelectasis.  No acute abnormality. Hepatobiliary: No focal liver abnormality is seen. No gallstones, gallbladder wall thickening, or biliary dilatation. Pancreas: Unremarkable. Spleen: Unremarkable. Adrenals/Urinary Tract: 1.7 cm right adrenal nodule with intermediate density (Hounsfield units of 52). Normal left adrenal gland. No urinary calculi or hydronephrosis. Unremarkable bladder. Stomach/Bowel: Moderate-large stool in the rectum and sigmoid colon with sigmoid colon and rectal wall thickening and mild adjacent stranding compatible with stercoral colitis. The small bowel and colon are normal in caliber. There is a left inguinal hernia which contains nonobstructed loops of small bowel. No bowel wall thickening. Normal appendix. There is a gas-filled tract between the anterior wall of the stomach through the abdominal wall into the skin surface (series 2/image 10/29/2036). Vascular/Lymphatic: Aortic atherosclerosis. No enlarged abdominal or pelvic lymph nodes. Reproductive: Uterus and bilateral adnexa are  unremarkable. Other: Small volume free fluid in the pelvis. No free intraperitoneal air. Left inguinal hernia containing fat and nonobstructed small bowel. Musculoskeletal: No acute osseous abnormality. IMPRESSION: 1. Moderate-large stool in the rectum and sigmoid colon wall thickening compatible with proctocolitis. 2. Left inguinal hernia containing nonobstructed small bowel. 3. Gas-filled tract between the anterior wall of the stomach through the abdominal wall into the skin surface. Correlate with history of prior PEG tube. Fistulous connection is not excluded. 4. Indeterminate 1.7 cm right adrenal nodule. This is likely a benign adenoma. One year follow-up adrenal washout CT is recommended. If stable for 1 year, no further follow-up imaging is recommended. Aortic Atherosclerosis (ICD10-I70.0). Electronically Signed   By: TPlacido SouM.D.   On: 08/01/2022 00:43     Data Reviewed: Relevant notes from  primary care and specialist visits, past discharge summaries as available in EHR, including Care Everywhere. Prior diagnostic testing as pertinent to current admission diagnoses Updated medications and problem lists for reconciliation ED course, including vitals, labs, imaging, treatment and response to treatment Triage notes, nursing and pharmacy notes and ED provider's notes Notable results as noted in HPI   Assessment and Plan:  UTI (urinary tract infection) Treat patient empirically with Rocephin 1 g IV daily Follow-up results of urine culture  Proctocolitis Daily stool softeners, laxatives, with suppositories, enemas as needed to maintain bowel regimen  Functional quadriplegia (HCC) Secondary to stiff man syndrome and history of prior stroke Patient will require assistance with all activities of daily living She will need to be turned frequently to prevent development of pressure ulcers   Essential hypertension Continue lisinopril and metoprolol    Chronic pain Continue Cymbalta  and oxycodone   Stiffman syndrome Continue baclofen   Dysphagia Secondary to stroke Patient is on a pured diet and will require assistance with all meals   Seizure ([place]) Continue Vimpat, Keppra and zonisamide   DVT prophylaxis: Lovenox  Consults: none  Advance Care Planning:   Code Status: Prior   Family Communication: daughter Brynae Quintal  Disposition Plan: Back to previous home environment  Severity of Illness: The appropriate patient status for this patient is OBSERVATION. Observation status is judged to be reasonable and necessary in order to provide the required intensity of service to ensure the patient's safety. The patient's presenting symptoms, physical exam findings, and initial radiographic and laboratory data in the context of their medical condition is felt to place them at decreased risk for further clinical deterioration. Furthermore, it is anticipated that the patient will be medically stable for discharge from the hospital within 2 midnights of admission.   Author: Athena Masse, MD 08/01/2022 3:51 AM  For on call review [email or web address].

## 2022-08-02 ENCOUNTER — Inpatient Hospital Stay: Payer: Medicaid Other

## 2022-08-02 DIAGNOSIS — N3000 Acute cystitis without hematuria: Secondary | ICD-10-CM

## 2022-08-02 DIAGNOSIS — R569 Unspecified convulsions: Secondary | ICD-10-CM

## 2022-08-02 DIAGNOSIS — K529 Noninfective gastroenteritis and colitis, unspecified: Secondary | ICD-10-CM | POA: Diagnosis not present

## 2022-08-02 LAB — BASIC METABOLIC PANEL
Anion gap: 10 (ref 5–15)
BUN: 20 mg/dL (ref 6–20)
CO2: 23 mmol/L (ref 22–32)
Calcium: 9 mg/dL (ref 8.9–10.3)
Chloride: 105 mmol/L (ref 98–111)
Creatinine, Ser: 1.01 mg/dL — ABNORMAL HIGH (ref 0.44–1.00)
GFR, Estimated: >60 mL/min (ref >60)
Glucose, Bld: 90 mg/dL (ref 70–99)
Potassium: 4.5 mmol/L (ref 3.5–5.1)
Sodium: 138 mmol/L (ref 135–145)

## 2022-08-02 LAB — MAGNESIUM: Magnesium: 2.2 mg/dL (ref 1.7–2.4)

## 2022-08-02 LAB — CBC
HCT: 33 % — ABNORMAL LOW (ref 36.0–46.0)
Hemoglobin: 10.8 g/dL — ABNORMAL LOW (ref 12.0–15.0)
MCH: 35.1 pg — ABNORMAL HIGH (ref 26.0–34.0)
MCHC: 32.7 g/dL (ref 30.0–36.0)
MCV: 107.1 fL — ABNORMAL HIGH (ref 80.0–100.0)
Platelets: 186 10*3/uL (ref 150–400)
RBC: 3.08 MIL/uL — ABNORMAL LOW (ref 3.87–5.11)
RDW: 15 % (ref 11.5–15.5)
WBC: 8.5 10*3/uL (ref 4.0–10.5)
nRBC: 0 % (ref 0.0–0.2)

## 2022-08-02 LAB — VALPROIC ACID LEVEL: Valproic Acid Lvl: 12 ug/mL — ABNORMAL LOW (ref 50.0–100.0)

## 2022-08-02 MED ORDER — LEVETIRACETAM IN NACL 1000 MG/100ML IV SOLN
1000.0000 mg | Freq: Two times a day (BID) | INTRAVENOUS | Status: DC
  Administered 2022-08-02 (×2): 1000 mg via INTRAVENOUS
  Filled 2022-08-02 (×3): qty 100

## 2022-08-02 NOTE — Progress Notes (Signed)
PROGRESS NOTE  Katherine Fields    DOB: Nov 06, 1965, 57 y.o.  [id number]    Code Status: DNR   DOA: 07/31/2022   LOS: 1   Brief hospital course  Katherine Fields is a 57 y.o. 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  They presented from SNF to the ED on 07/31/2022 with abdominal pain x several days. Patient is nonverbal and unable to give history. Her daughter reports that they sent patient to ED because it had been several days without BM and appeared to have abdominal pain. There was an xray at the facility which suggested ileus.   In the ED, it was found that they had stable vital signs.  Significant findings included WBC normal at 4200, hemoglobin 11.7.  Creatinine at baseline.  Urinalysis with positive nitrites and trace leukocyte esterase with few bacteria. CT abdomen and pelvis consistent with proctocolitis.  They were initially treated with CTX for UTI and given reglan.   Patient was admitted to medicine service for further workup and management of proctocolitis and UTI as outlined in detail below.  08/02/22 -uncomfortable, poor PO intake, likely from early satiety, nausea and pain  Assessment & Plan  Principal Problem:   Urinary tract infection Active Problems:   Stercoral colitis   Seizures (HCC)   Epilepsy, generalized, convulsive ([place])   Stiffman syndrome   Essential hypertension   Functional quadriplegia ([place])   G tube feedings (HCC)   Hemiparesis affecting dominant side as late effect of cerebrovascular accident [facility])   Dysphagia following cerebral infarction   Gastrostomy in place [facility])  Seizure disorder- unfortunately, patient had seizure overnight. She had missed several doses of her medications due to poor PO intake. Keppra was transitioned to IV and she received IV ativan overnight. She is returned to  baseline this morning. Called to inform her daughter this morning. Labs showing subtherapeutic level of vimpat.  - Continue Vimpat, Keppra and zonisamide - repeat vimpat level am  UTI -  Rocephin 1 g IV daily - f/u UxCx- no results at this time.  - strict I/O   Proctocolitis- likely large stool burden is causing pain and poor PO intake as seen on admission CT abdomen. She had large BM last night after enema and denies any abdominal pain this am. She states that she feels well and wants to eat today.  - continue bowel regimen as ordered - scheduled antiemetics as she is nonverbal - low threshold to get general surgery on board if worsens - IV hydration while not having good PO intake - frequent abdominal exams to assess for progression,  - re-image if acute worsening   Functional quadriplegia s/p CVA - PT/OT/SLP - frequent turning to prevent skin breakdown - assist with feeding   Essential hypertension Continue lisinopril and metoprolol    Chronic pain Continue Cymbalta and oxycodone   Stiffman syndrome Continue baclofen   Dysphagia Secondary to stroke Patient is on a pured diet and will require assistance with all meals  Body mass index is 23.98 kg/m.  VTE ppx: enoxaparin (LOVENOX) injection 40 mg Start: 08/01/22 0800  Diet:     Diet   DIET - DYS 1 Room service appropriate? No; Fluid consistency: Thin   Consultants: None   Subjective 08/02/22    Pt reports denies abdominal pain. She nods her head that she would like to eat today. Does  not remember the seizure activity overnight. Feels back to her baseline.    Objective   Vitals:   08/01/22 1602 08/01/22 2243 08/01/22 2306 08/01/22 2344  BP: 138/72 (!) 167/86 135/81 124/68  Pulse: 88 (!) 128 (!) 122 (!) 109  Resp: 16 (!) '21 18 18  '$ Temp: 98.3 F (36.8 C) 98 F (36.7 C) 98.9 F (37.2 C) 99.3 F (37.4 C)  TempSrc:      SpO2: 98% 99% 97% 96%  Weight:        Intake/Output Summary (Last 24 hours) at  08/02/2022 0734 Last data filed at 08/02/2022 [id number] Gross per 24 hour  Intake 1699.59 ml  Output 400 ml  Net 1299.59 ml    Filed Weights   07/31/22 2255  Weight: 67.4 kg    Physical Exam:  General: awake, alert, NAD at rest HEENT: atraumatic, clear conjunctiva, anicteric sclera, MMM Respiratory: normal respiratory effort. CTAB Cardiovascular: quick capillary refill, normal S1/S2, RRR, no JVD, murmurs Gastrointestinal: soft. Prior surgical scars healed. No rash. No tenderness to palpation. Nervous: Alert. Some minimal movement of upper extremities  Extremities:  no edema Skin: dry, intact, normal temperature, normal color. No rashes, lesions or ulcers on exposed skin  Labs   I have personally reviewed the following labs and imaging studies CBC    Component Value Date/Time   WBC 8.5 08/02/2022 0421   RBC 3.08 (L) 08/02/2022 0421   HGB 10.8 (L) 08/02/2022 0421   HGB 8.7 (L) 10/31/2011 1439   HCT 33.0 (L) 08/02/2022 0421   HCT 28.6 (L) 10/31/2011 1439   PLT 186 08/02/2022 0421   PLT 331 10/31/2011 1439   MCV 107.1 (H) 08/02/2022 0421   MCV 72 (L) 10/31/2011 1439   MCH 35.1 (H) 08/02/2022 0421   MCHC 32.7 08/02/2022 0421   RDW 15.0 08/02/2022 0421   RDW 20.5 (H) 10/31/2011 1439   LYMPHSABS 2.1 07/31/2022 2326   MONOABS 0.4 07/31/2022 2326   EOSABS 0.0 07/31/2022 2326   BASOSABS 0.0 07/31/2022 2326   BASOSABS 1 10/21/2011 2219      Latest Ref Rng & Units 08/02/2022    4:21 AM 07/31/2022   11:26 PM 07/07/2022   11:45 AM  BMP  Glucose 70 - 99 mg/dL 90  83  71   BUN 6 - 20 mg/dL '20  22  25   '$ Creatinine 0.44 - 1.00 mg/dL 1.01  1.24  1.32   Sodium 135 - 145 mmol/L 138  138  140   Potassium 3.5 - 5.1 mmol/L 4.5  4.6  4.3   Chloride 98 - 111 mmol/L 105  104  105   CO2 22 - 32 mmol/L '23  24  28   '$ Calcium 8.9 - 10.3 mg/dL 9.0  9.2  9.6     CT ABDOMEN PELVIS W CONTRAST  Result Date: 08/01/2022 CLINICAL DATA:  Bed-bound stroke patient with outpatient KUB with mild ileus. Eval for  signs of small-bowel obstruction or ileus. EXAM: CT ABDOMEN AND PELVIS WITH CONTRAST TECHNIQUE: Multidetector CT imaging of the abdomen and pelvis was performed using the standard protocol following bolus administration of intravenous contrast. RADIATION DOSE REDUCTION: This exam was performed according to the departmental dose-optimization program which includes automated exposure control, adjustment of the mA and/or kV according to patient size and/or use of iterative reconstruction technique. CONTRAST:  123m OMNIPAQUE IOHEXOL 300 MG/ML  SOLN COMPARISON:  None Available. FINDINGS: Lower chest: Bibasilar scarring/atelectasis.  No acute abnormality. Hepatobiliary: No focal liver abnormality is seen.  No gallstones, gallbladder wall thickening, or biliary dilatation. Pancreas: Unremarkable. Spleen: Unremarkable. Adrenals/Urinary Tract: 1.7 cm right adrenal nodule with intermediate density (Hounsfield units of 52). Normal left adrenal gland. No urinary calculi or hydronephrosis. Unremarkable bladder. Stomach/Bowel: Moderate-large stool in the rectum and sigmoid colon with sigmoid colon and rectal wall thickening and mild adjacent stranding compatible with stercoral colitis. The small bowel and colon are normal in caliber. There is a left inguinal hernia which contains nonobstructed loops of small bowel. No bowel wall thickening. Normal appendix. There is a gas-filled tract between the anterior wall of the stomach through the abdominal wall into the skin surface (series 2/image 10/29/2036). Vascular/Lymphatic: Aortic atherosclerosis. No enlarged abdominal or pelvic lymph nodes. Reproductive: Uterus and bilateral adnexa are unremarkable. Other: Small volume free fluid in the pelvis. No free intraperitoneal air. Left inguinal hernia containing fat and nonobstructed small bowel. Musculoskeletal: No acute osseous abnormality. IMPRESSION: 1. Moderate-large stool in the rectum and sigmoid colon wall thickening compatible  with proctocolitis. 2. Left inguinal hernia containing nonobstructed small bowel. 3. Gas-filled tract between the anterior wall of the stomach through the abdominal wall into the skin surface. Correlate with history of prior PEG tube. Fistulous connection is not excluded. 4. Indeterminate 1.7 cm right adrenal nodule. This is likely a benign adenoma. One year follow-up adrenal washout CT is recommended. If stable for 1 year, no further follow-up imaging is recommended. Aortic Atherosclerosis (ICD10-I70.0). Electronically Signed   By: Placido Sou M.D.   On: 08/01/2022 00:43    Disposition Plan & Communication  Patient status: Inpatient  Admitted From: SNF Planned disposition location: Skilled nursing facility Anticipated discharge date: 3/4 pending bowel clean out and able to tolerate PO, including able to take seizure medications  Family Communication: daughter on phone    Author: Richarda Osmond, DO [facility] 08/02/2022, 7:34 AM   Available by Epic secure chat 7AM-7PM. If 7PM-7AM, please contact night-coverage.  [facility] contact information found on [email or web address].

## 2022-08-02 NOTE — Plan of Care (Signed)
  Problem: Clinical Measurements: Goal: Ability to maintain clinical measurements within normal limits will improve Outcome: Progressing Goal: Cardiovascular complication will be avoided Outcome: Progressing   Problem: Elimination: Goal: Will not experience complications related to bowel motility Outcome: Progressing   Problem: Safety: Goal: Ability to remain free from injury will improve Outcome: Progressing

## 2022-08-03 DIAGNOSIS — N3000 Acute cystitis without hematuria: Secondary | ICD-10-CM | POA: Diagnosis not present

## 2022-08-03 DIAGNOSIS — K529 Noninfective gastroenteritis and colitis, unspecified: Secondary | ICD-10-CM | POA: Diagnosis not present

## 2022-08-03 LAB — CBC
HCT: 34.1 % — ABNORMAL LOW (ref 36.0–46.0)
Hemoglobin: 11.5 g/dL — ABNORMAL LOW (ref 12.0–15.0)
MCH: 35.1 pg — ABNORMAL HIGH (ref 26.0–34.0)
MCHC: 33.7 g/dL (ref 30.0–36.0)
MCV: 104 fL — ABNORMAL HIGH (ref 80.0–100.0)
Platelets: 200 10*3/uL (ref 150–400)
RBC: 3.28 MIL/uL — ABNORMAL LOW (ref 3.87–5.11)
RDW: 14.7 % (ref 11.5–15.5)
WBC: 5 10*3/uL (ref 4.0–10.5)
nRBC: 0 % (ref 0.0–0.2)

## 2022-08-03 LAB — BASIC METABOLIC PANEL
Anion gap: 10 (ref 5–15)
BUN: 17 mg/dL (ref 6–20)
CO2: 24 mmol/L (ref 22–32)
Calcium: 9.2 mg/dL (ref 8.9–10.3)
Chloride: 101 mmol/L (ref 98–111)
Creatinine, Ser: 1.04 mg/dL — ABNORMAL HIGH (ref 0.44–1.00)
GFR, Estimated: >60 mL/min (ref >60)
Glucose, Bld: 76 mg/dL (ref 70–99)
Potassium: 4.5 mmol/L (ref 3.5–5.1)
Sodium: 135 mmol/L (ref 135–145)

## 2022-08-03 LAB — VALPROIC ACID LEVEL: Valproic Acid Lvl: 35 ug/mL — ABNORMAL LOW (ref 50.0–100.0)

## 2022-08-03 MED ORDER — PANTOPRAZOLE SODIUM 40 MG IV SOLR
40.0000 mg | Freq: Two times a day (BID) | INTRAVENOUS | Status: DC
  Administered 2022-08-03 – 2022-08-04 (×3): 40 mg via INTRAVENOUS
  Filled 2022-08-03 (×3): qty 10

## 2022-08-03 MED ORDER — LEVETIRACETAM 500 MG PO TABS
1000.0000 mg | ORAL_TABLET | Freq: Two times a day (BID) | ORAL | Status: DC
  Administered 2022-08-03 – 2022-08-04 (×3): 1000 mg via ORAL
  Filled 2022-08-03 (×5): qty 2

## 2022-08-03 NOTE — Progress Notes (Addendum)
PROGRESS NOTE  Katherine Fields    DOB: [birth year], 57 y.o.  [id number]    Code Status: DNR   DOA: 07/31/2022   LOS: 2   Brief hospital course  Katherine Fields is a 57 y.o. 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  They presented from SNF to the ED on 07/31/2022 with abdominal pain x several days. Patient is nonverbal and unable to give history. Her daughter reports that they sent patient to ED because it had been several days without BM and appeared to have abdominal pain. There was an xray at the facility which suggested ileus.   In the ED, it was found that they had stable vital signs.  Significant findings included WBC normal at 4200, hemoglobin 11.7.  Creatinine at baseline.  Urinalysis with positive nitrites and trace leukocyte esterase with few bacteria. CT abdomen and pelvis consistent with proctocolitis.  They were initially treated with CTX for UTI and given reglan.   Patient was admitted to medicine service for further workup and management of proctocolitis and UTI as outlined in detail below.  3/2: had large BM overnight after enema. Also had seizure overnight from several missed anti-seizure medications. Returned to baseline this morning.   08/03/22 - comfortable. Taking good PO intake. Continues on bowel regimen and IV Abx for UTI. Will be ready for dc to SNF tomorrow.   Assessment & Plan  Principal Problem:   Urinary tract infection Active Problems:   Stercoral colitis   Seizures (HCC)   Epilepsy, generalized, convulsive ([place])   Stiffman syndrome   Essential hypertension   Functional quadriplegia ([place])   G tube feedings (HCC)   Hemiparesis affecting dominant side as late effect of cerebrovascular accident [facility])   Dysphagia following cerebral infarction   Gastrostomy in place [facility])    Proctocolitis  Seizure disorder- unfortunately, patient had seizure overnight 3/1. She had missed several doses of her medications due to poor PO intake. Keppra was transitioned to IV and she received IV ativan overnight. She is returned to baseline now without further visual seizures. Labs showing subtherapeutic level of vimpat- 12>35.  - Continue Vimpat, Keppra and zonisamide - repeat vimpat level am  UTI- denies dysuria currently -  Rocephin 1 g IV daily- transition to PO with UxCx results.  - f/u UxCx- no results at this time.  - strict I/O   Proctocolitis- likely large stool burden is causing pain and poor PO intake as seen on admission CT abdomen. She had large BM after enema and denies any abdominal pain this am. She states that she feels well and wants to eat today.  - continue bowel regimen as ordered - scheduled antiemetics as she is nonverbal - low threshold to get general surgery on board if worsens - frequent abdominal exams to assess for progression,  - re-image if acute worsening   Functional quadriplegia s/p CVA - PT/OT/SLP - frequent turning to prevent skin breakdown - assist with feeding   Essential hypertension Continue lisinopril and metoprolol    Chronic pain Continue Cymbalta and oxycodone   Stiffman syndrome Continue baclofen   Dysphagia Secondary to stroke Patient is on a pured diet and will require assistance with all meals  Body mass index is 23.98 kg/m.  VTE ppx: enoxaparin (LOVENOX) injection 40 mg Start: 08/01/22 0800  Diet:     Diet   DIET - DYS  1 Room service appropriate? No; Fluid consistency: Thin   Consultants: None   Subjective 08/03/22    Pt reports no complaints or needs this morning. She denies abdominal pain or nausea.    Objective   Vitals:   08/02/22 0742 08/02/22 1009 08/02/22 1508 08/03/22 0002  BP: (!) 155/91 (!) 148/76 (!) 153/90 131/63  Pulse: 85 91 81 74  Resp: '17  17 18  '$ Temp: 98.2 F (36.8 C)  98.4 F (36.9  C) 98.6 F (37 C)  TempSrc: Oral  Oral   SpO2: 100%  100% 100%  Weight:        Intake/Output Summary (Last 24 hours) at 08/03/2022 0735 Last data filed at 08/03/2022 0711 Gross per 24 hour  Intake 1221.48 ml  Output 3650 ml  Net -2428.52 ml    Filed Weights   07/31/22 2255  Weight: 67.4 kg    Physical Exam:  General: awake, alert, NAD at rest HEENT: atraumatic, clear conjunctiva, anicteric sclera, MMM Respiratory: normal respiratory effort. CTAB Cardiovascular: quick capillary refill Gastrointestinal: soft. Prior surgical scars healed. No rash. No tenderness to palpation. Nervous: Alert. Some minimal movement of upper extremities  Extremities:  no edema Skin: dry, intact, normal temperature, normal color. No rashes, lesions or ulcers on exposed skin  Labs   I have personally reviewed the following labs and imaging studies CBC    Component Value Date/Time   WBC 5.0 08/03/2022 0622   RBC 3.28 (L) 08/03/2022 0622   HGB 11.5 (L) 08/03/2022 0622   HGB 8.7 (L) 10/31/2011 1439   HCT 34.1 (L) 08/03/2022 0622   HCT 28.6 (L) 10/31/2011 1439   PLT 200 08/03/2022 0622   PLT 331 10/31/2011 1439   MCV 104.0 (H) 08/03/2022 0622   MCV 72 (L) 10/31/2011 1439   MCH 35.1 (H) 08/03/2022 0622   MCHC 33.7 08/03/2022 0622   RDW 14.7 08/03/2022 0622   RDW 20.5 (H) 10/31/2011 1439   LYMPHSABS 2.1 07/31/2022 2326   MONOABS 0.4 07/31/2022 2326   EOSABS 0.0 07/31/2022 2326   BASOSABS 0.0 07/31/2022 2326   BASOSABS 1 10/21/2011 2219      Latest Ref Rng & Units 08/03/2022    5:11 AM 08/02/2022    4:21 AM 07/31/2022   11:26 PM  BMP  Glucose 70 - 99 mg/dL 76  90  83   BUN 6 - 20 mg/dL '17  20  22   '$ Creatinine 0.44 - 1.00 mg/dL 1.04  1.01  1.24   Sodium 135 - 145 mmol/L 135  138  138   Potassium 3.5 - 5.1 mmol/L 4.5  4.5  4.6   Chloride 98 - 111 mmol/L 101  105  104   CO2 22 - 32 mmol/L '24  23  24   '$ Calcium 8.9 - 10.3 mg/dL 9.2  9.0  9.2     DG Abd 1 View  Result Date:  08/02/2022 CLINICAL DATA:  Constipation EXAM: ABDOMEN - 1 VIEW COMPARISON:  None Available. FINDINGS: Moderate fecal loading in the colon. No free air, portal venous gas, or pneumatosis. No definite bowel obstruction. No other suspicious abnormalities. IMPRESSION: Moderate fecal loading in the colon. Electronically Signed   By: Dorise Bullion III M.D.   On: 08/02/2022 09:27    Disposition Plan & Communication  Patient status: Inpatient  Admitted From: SNF Planned disposition location: Skilled nursing facility Anticipated discharge date: 3/4 pending bowel clean out and able to tolerate PO, including able to take seizure medications  Family  Communication: daughter on phone    Author: Richarda Osmond, DO [facility] 08/03/2022, 7:35 AM   Available by Epic secure chat 7AM-7PM. If 7PM-7AM, please contact night-coverage.  [facility] contact information found on [email or web address].

## 2022-08-03 NOTE — Plan of Care (Signed)
  Problem: Clinical Measurements: Goal: Diagnostic test results will improve Outcome: Progressing   Problem: Nutrition: Goal: Adequate nutrition will be maintained Outcome: Progressing   Problem: Elimination: Goal: Will not experience complications related to bowel motility Outcome: Progressing   

## 2022-08-04 DIAGNOSIS — N3000 Acute cystitis without hematuria: Secondary | ICD-10-CM | POA: Diagnosis not present

## 2022-08-04 DIAGNOSIS — K529 Noninfective gastroenteritis and colitis, unspecified: Secondary | ICD-10-CM | POA: Diagnosis not present

## 2022-08-04 DIAGNOSIS — G40909 Epilepsy, unspecified, not intractable, without status epilepticus: Secondary | ICD-10-CM | POA: Diagnosis not present

## 2022-08-04 DIAGNOSIS — G8220 Paraplegia, unspecified: Secondary | ICD-10-CM | POA: Diagnosis not present

## 2022-08-04 LAB — LEVETIRACETAM LEVEL: Levetiracetam Lvl: 44.5 ug/mL — ABNORMAL HIGH (ref 10.0–40.0)

## 2022-08-04 LAB — VALPROIC ACID LEVEL: Valproic Acid Lvl: 41 ug/mL — ABNORMAL LOW (ref 50.0–100.0)

## 2022-08-04 MED ORDER — ONDANSETRON HCL 4 MG PO TABS: 4.0000 mg | ORAL_TABLET | Freq: Three times a day (TID) | ORAL | 0 refills | Status: DC | PRN

## 2022-08-04 MED ORDER — CEPHALEXIN 250 MG PO CAPS: 250.0000 mg | ORAL_CAPSULE | Freq: Four times a day (QID) | ORAL | 0 refills | Status: AC

## 2022-08-04 MED ORDER — CEPHALEXIN 250 MG PO CAPS
250.0000 mg | ORAL_CAPSULE | Freq: Four times a day (QID) | ORAL | Status: DC
  Filled 2022-08-04: qty 1

## 2022-08-04 MED ORDER — LORAZEPAM 0.5 MG PO TABS: 0.5000 mg | ORAL_TABLET | Freq: Two times a day (BID) | ORAL | 0 refills | Status: AC | PRN

## 2022-08-04 MED ORDER — ACETAMINOPHEN 500 MG PO TABS: 500.0000 mg | ORAL_TABLET | Freq: Four times a day (QID) | ORAL | Status: DC | PRN

## 2022-08-04 NOTE — Discharge Summary (Addendum)
Physician Discharge Summary  Patient: Katherine Fields [id number] DOB: 06-Nov-1965   Code Status: DNR Admit date: 07/31/2022 Discharge date: 08/04/2022 Disposition: Skilled nursing facility, PT, OT, SLP, nurse aid, and RN PCP: Gearldine Shown, DO  Recommendations for Outpatient Follow-up:  Follow up with PCP within 1-2 weeks Regarding general hospital follow up and preventative care Recommend rechecking valproic acid levels Monitor stool output, abdominal discomfort  Discharge Diagnoses:  Principal Problem:   Urinary tract infection Active Problems:   Stercoral colitis   Seizures ([place])   Epilepsy, generalized, convulsive ([place])   Stiffman syndrome   Essential hypertension   Functional quadriplegia ([place])   G tube feedings ([place])   Hemiparesis affecting dominant side as late effect of cerebrovascular accident [facility])   Dysphagia following cerebral infarction   Gastrostomy in place [facility])   Proctocolitis   Paraplegia ([place])   Seizure disorder [facility])  Brief Hospital Course Summary: Katherine Fields is a 58 y.o. 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 Able to head nod/shake for communication at baseline.   They presented from SNF to the ED on 07/31/2022 with abdominal pain x several days. Patient is nonverbal and unable to give history. Her daughter reports that they sent patient to ED because it had been several days without BM and appeared to have abdominal pain. There was an xray at the facility which suggested ileus.    In the ED, it was found that they had stable vital signs.  Significant findings included WBC normal at 4200, hemoglobin 11.7.  Creatinine at baseline.  Urinalysis with positive nitrites and trace leukocyte esterase with few bacteria. CT abdomen and pelvis consistent with proctocolitis.   They were  initially treated with CTX for UTI and given reglan.    Patient was admitted to medicine service for further workup and management of proctocolitis and UTI as outlined in detail below.   3/2: had large BM overnight after enema. Also had seizure overnight from several missed anti-seizure medications. Was given IV antiepileptics. Returned to baseline this morning.   3/3: eating well. Denies abdominal pain. Had another BM. Able to tolerate PO medications.    3/4: continues to have good PO intake including PO Abx to complete her UTI treatment. Continues to have daily BM and denies abdominal pain. Abdomen is soft and non-distended on exam. She is ready to be discharged back to facility.   All other chronic conditions were treated with home medications.   Discharge Condition: Good, improved Recommended discharge diet: Regular healthy diet  Consultations: None   Procedures/Studies: None   Allergies as of 08/04/2022       Reactions   Tramadol    Other reaction(s): Unknown        Medication List     STOP taking these medications    oxyCODONE 5 MG immediate release tablet Commonly known as: Oxy IR/ROXICODONE       TAKE these medications    acetaminophen 500 MG tablet Commonly known as: TYLENOL Take 1 tablet (500 mg total) by mouth every 6 (six) hours as needed for mild pain (or Fever >/= 101).   atorvastatin 40 MG tablet Commonly known as: LIPITOR Take 40 mg by mouth daily.   Baclofen 5 MG Tabs Take 2 tablets by mouth 2 (two) times daily.   bisacodyl 10 MG suppository Commonly known as: DULCOLAX Place 10 mg rectally as  needed for moderate constipation.   cephALEXin 250 MG capsule Commonly known as: KEFLEX Take 1 capsule (250 mg total) by mouth every 6 (six) hours for 2 days. Start taking on: August 05, 2022   divalproex 125 MG capsule Commonly known as: DEPAKOTE SPRINKLE Take 250 mg by mouth 2 (two) times daily.   DULoxetine 20 MG capsule Commonly known as:  CYMBALTA Take 20 mg by mouth 2 (two) times daily.   Glucagon Emergency 1 MG Kit SMARTSIG:1 Milligram(s) IM Once PRN   lacosamide 200 MG Tabs tablet Commonly known as: VIMPAT Take 200 mg by mouth 2 (two) times daily.   lactulose 10 GM/15ML solution Commonly known as: CHRONULAC Take 30 mLs by mouth 2 (two) times daily.   levETIRAcetam 100 MG/ML solution Commonly known as: KEPPRA Take 10 mLs (1,000 mg total) by mouth 2 (two) times daily.   lisinopril 20 MG tablet Commonly known as: ZESTRIL Take 20 mg by mouth daily.   LORazepam 0.5 MG tablet Commonly known as: ATIVAN Take 1 tablet (0.5 mg total) by mouth 2 (two) times daily as needed for anxiety. What changed:  when to take this Another medication with the same name was removed. Continue taking this medication, and follow the directions you see here.   metoprolol tartrate 25 MG tablet Commonly known as: LOPRESSOR Take 25 mg by mouth 2 (two) times daily.   ondansetron 4 MG tablet Commonly known as: ZOFRAN Take 1 tablet (4 mg total) by mouth every 8 (eight) hours as needed for nausea or vomiting.   One-Daily Multi-Vit/Mineral Tabs Take 1 tablet by mouth daily.   pantoprazole 40 MG tablet Commonly known as: PROTONIX Take 40 mg by mouth 2 (two) times daily.   promethazine 25 MG/ML injection Commonly known as: PHENERGAN Inject 25 mg into the muscle every 4 (four) hours as needed for vomiting or nausea.   senna 8.6 MG Tabs tablet Commonly known as: SENOKOT Take 2 tablets by mouth 2 (two) times daily.   zonisamide 100 MG capsule Commonly known as: ZONEGRAN Take 1 capsule (100 mg total) by mouth 2 (two) times daily.       Subjective   Pt reports no pain in abdomen. Denies nausea. Endorses that she is hungry. Denies dysuria. Denies any further needs at this time.  Daughter was called and updated.  All questions and concerns were addressed at time of discharge.  Objective  Blood pressure (!) 150/76, pulse 72,  temperature 97.9 F (36.6 C), resp. rate 17, weight 67.4 kg, SpO2 100 %.   General: Pt is alert, awake, not in acute distress Cardiovascular: RRR, S1/S2 +, no rubs, no gallops Respiratory: CTA bilaterally, no wheezing, no rhonchi Abdominal: Soft, NT, ND, bowel sounds + Extremities: no edema, no cyanosis  The results of significant diagnostics from this hospitalization (including imaging, microbiology, ancillary and laboratory) are listed below for reference.   Imaging studies: DG Abd 1 View  Result Date: 08/02/2022 CLINICAL DATA:  Constipation EXAM: ABDOMEN - 1 VIEW COMPARISON:  None Available. FINDINGS: Moderate fecal loading in the colon. No free air, portal venous gas, or pneumatosis. No definite bowel obstruction. No other suspicious abnormalities. IMPRESSION: Moderate fecal loading in the colon. Electronically Signed   By: Dorise Bullion III M.D.   On: 08/02/2022 09:27   CT ABDOMEN PELVIS W CONTRAST  Result Date: 08/01/2022 CLINICAL DATA:  Bed-bound stroke patient with outpatient KUB with mild ileus. Eval for signs of small-bowel obstruction or ileus. EXAM: CT ABDOMEN AND PELVIS WITH CONTRAST  TECHNIQUE: Multidetector CT imaging of the abdomen and pelvis was performed using the standard protocol following bolus administration of intravenous contrast. RADIATION DOSE REDUCTION: This exam was performed according to the departmental dose-optimization program which includes automated exposure control, adjustment of the mA and/or kV according to patient size and/or use of iterative reconstruction technique. CONTRAST:  169m OMNIPAQUE IOHEXOL 300 MG/ML  SOLN COMPARISON:  None Available. FINDINGS: Lower chest: Bibasilar scarring/atelectasis.  No acute abnormality. Hepatobiliary: No focal liver abnormality is seen. No gallstones, gallbladder wall thickening, or biliary dilatation. Pancreas: Unremarkable. Spleen: Unremarkable. Adrenals/Urinary Tract: 1.7 cm right adrenal nodule with intermediate density  (Hounsfield units of 52). Normal left adrenal gland. No urinary calculi or hydronephrosis. Unremarkable bladder. Stomach/Bowel: Moderate-large stool in the rectum and sigmoid colon with sigmoid colon and rectal wall thickening and mild adjacent stranding compatible with stercoral colitis. The small bowel and colon are normal in caliber. There is a left inguinal hernia which contains nonobstructed loops of small bowel. No bowel wall thickening. Normal appendix. There is a gas-filled tract between the anterior wall of the stomach through the abdominal wall into the skin surface (series 2/image 10/29/2036). Vascular/Lymphatic: Aortic atherosclerosis. No enlarged abdominal or pelvic lymph nodes. Reproductive: Uterus and bilateral adnexa are unremarkable. Other: Small volume free fluid in the pelvis. No free intraperitoneal air. Left inguinal hernia containing fat and nonobstructed small bowel. Musculoskeletal: No acute osseous abnormality. IMPRESSION: 1. Moderate-large stool in the rectum and sigmoid colon wall thickening compatible with proctocolitis. 2. Left inguinal hernia containing nonobstructed small bowel. 3. Gas-filled tract between the anterior wall of the stomach through the abdominal wall into the skin surface. Correlate with history of prior PEG tube. Fistulous connection is not excluded. 4. Indeterminate 1.7 cm right adrenal nodule. This is likely a benign adenoma. One year follow-up adrenal washout CT is recommended. If stable for 1 year, no further follow-up imaging is recommended. Aortic Atherosclerosis (ICD10-I70.0). Electronically Signed   By: TPlacido SouM.D.   On: 08/01/2022 00:43   [place]PELVIS LIMITED (TRANSABDOMINAL ONLY)  Result Date: 07/30/2022 CLINICAL DATA:  Soft tissue mass at mons pubis EXAM: LIMITED ULTRASOUND OF PELVIS TECHNIQUE: Limited transabdominal ultrasound examination of the pelvis was performed. COMPARISON:  None Available. FINDINGS: No definite mass or hernia identified in  the region of the patient's symptoms. There is peristalsing bowel on today's study. However, on real-time imaging, this was not thought to be located within the hernia. IMPRESSION: No definite mass or hernia identified in the region of the patient's symptoms. If there is continued concern, recommend CT imaging. Electronically Signed   By: DDorise BullionIII M.D.   On: 07/30/2022 14:24   CT Head Wo Contrast  Result Date: 07/07/2022 CLINICAL DATA:  Seizure-like activity patient has history of seizures and is on Keppra. EXAM: CT HEAD WITHOUT CONTRAST TECHNIQUE: Contiguous axial images were obtained from the base of the skull through the vertex without intravenous contrast. RADIATION DOSE REDUCTION: This exam was performed according to the departmental dose-optimization program which includes automated exposure control, adjustment of the mA and/or kV according to patient size and/or use of iterative reconstruction technique. COMPARISON:  CT examination dated May 09, 2022 FINDINGS: Brain: No evidence of acute infarction, hemorrhage, hydrocephalus, extra-axial collection or mass lesion/mass effect. Encephalomalacia of the left frontal lobe with ex vacuo dilatation of the left frontal horn, unchanged. Generalized atrophy of the cerebellum. Vascular: No hyperdense vessel or unexpected calcification. Skull: Normal. Negative for fracture or focal lesion. Sinuses/Orbits: No acute finding. Other: None.  IMPRESSION: 1. No acute intracranial abnormality. 2. Encephalomalacia of the left frontal lobe with ex vacuo dilatation of the left frontal horn, unchanged. 3. Generalized atrophy of the cerebellum. Electronically Signed   By: Keane Police D.O.   On: 07/07/2022 10:24    Labs: Basic Metabolic Panel: Recent Labs  Lab 07/31/22 2326 08/02/22 0421 08/03/22 0511  NA 138 138 135  K 4.6 4.5 4.5  CL 104 105 101  CO2 '24 23 24  '$ GLUCOSE 83 90 76  BUN 22* 20 17  CREATININE 1.24* 1.01* 1.04*  CALCIUM 9.2 9.0 9.2  MG   --  2.2  --    CBC: Recent Labs  Lab 07/31/22 2326 08/02/22 0421 08/03/22 0622  WBC 4.2 8.5 5.0  NEUTROABS 1.6*  --   --   HGB 11.7* 10.8* 11.5*  HCT 35.4* 33.0* 34.1*  MCV 108.6* 107.1* 104.0*  PLT 237 186 200   Microbiology: Results for orders placed or performed during the hospital encounter of 05/09/22  Resp Panel by RT-PCR (Flu A&B, Covid) Anterior Nasal Swab     Status: None   Collection Time: 05/09/22  9:11 AM   Specimen: Anterior Nasal Swab  Result Value Ref Range Status   SARS Coronavirus 2 by RT PCR NEGATIVE NEGATIVE Final    Comment: (NOTE) SARS-CoV-2 target nucleic acids are NOT DETECTED.  The SARS-CoV-2 RNA is generally detectable in upper respiratory specimens during the acute phase of infection. The lowest concentration of SARS-CoV-2 viral copies this assay can detect is 138 copies/mL. A negative result does not preclude SARS-Cov-2 infection and should not be used as the sole basis for treatment or other patient management decisions. A negative result may occur with  improper specimen collection/handling, submission of specimen other than nasopharyngeal swab, presence of viral mutation(s) within the areas targeted by this assay, and inadequate number of viral copies(<138 copies/mL). A negative result must be combined with clinical observations, patient history, and epidemiological information. The expected result is Negative.  Fact Sheet for Patients:  EntrepreneurPulse.com.au  Fact Sheet for Healthcare Providers:  IncredibleEmployment.be  This test is no t yet approved or cleared by the [place] FDA and  has been authorized for detection and/or diagnosis of SARS-CoV-2 by FDA under an Emergency Use Authorization (EUA). This EUA will remain  in effect (meaning this test can be used) for the duration of the COVID-19 declaration under Section 564(b)(1) of the Act, 21 U.S.C.section 360bbb-3(b)(1), unless the  authorization is terminated  or revoked sooner.       Influenza A by PCR NEGATIVE NEGATIVE Final   Influenza B by PCR NEGATIVE NEGATIVE Final    Comment: (NOTE) The Xpert Xpress SARS-CoV-2/FLU/RSV plus assay is intended as an aid in the diagnosis of influenza from Nasopharyngeal swab specimens and should not be used as a sole basis for treatment. Nasal washings and aspirates are unacceptable for Xpert Xpress SARS-CoV-2/FLU/RSV testing.  Fact Sheet for Patients: EntrepreneurPulse.com.au  Fact Sheet for Healthcare Providers: IncredibleEmployment.be  This test is not yet approved or cleared by the [place] FDA and has been authorized for detection and/or diagnosis of SARS-CoV-2 by FDA under an Emergency Use Authorization (EUA). This EUA will remain in effect (meaning this test can be used) for the duration of the COVID-19 declaration under Section 564(b)(1) of the Act, 21 U.S.C. section 360bbb-3(b)(1), unless the authorization is terminated or revoked.  Performed at [facility], [address] [place], [place] [address]    Time coordinating discharge: Over 69  minutes  Richarda Osmond, MD  [facility] 08/04/2022, 1:33 PM

## 2022-08-04 NOTE — Progress Notes (Incomplete)
PROGRESS NOTE  Katherine Fields    DOB: 1965-10-14, 57 y.o.  [id number]    Code Status: DNR   DOA: 07/31/2022   LOS: 3   Brief hospital course  Katherine Fields is a 57 y.o. 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  They presented from SNF to the ED on 07/31/2022 with abdominal pain x several days. Patient is nonverbal and unable to give history. Her daughter reports that they sent patient to ED because it had been several days without BM and appeared to have abdominal pain. There was an xray at the facility which suggested ileus.   In the ED, it was found that they had stable vital signs.  Significant findings included WBC normal at 4200, hemoglobin 11.7.  Creatinine at baseline.  Urinalysis with positive nitrites and trace leukocyte esterase with few bacteria. CT abdomen and pelvis consistent with proctocolitis.  They were initially treated with CTX for UTI and given reglan.   Patient was admitted to medicine service for further workup and management of proctocolitis and UTI as outlined in detail below.  3/2: had large BM overnight after enema. Also had seizure overnight from several missed anti-seizure medications. Returned to baseline this morning.   08/04/22 - comfortable. Taking good PO intake. Continues on bowel regimen and IV Abx for UTI. Will be ready for dc to SNF tomorrow.   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 She had missed several doses of her medications due to poor PO intake. Keppra was transitioned to IV and she received IV ativan overnight. She is returned to baseline now without further visual seizures. Labs showing subtherapeutic level of vimpat- 12>35.  - Continue Vimpat, Keppra and zonisamide - repeat vimpat level am  UTI- denies dysuria currently -  Rocephin 1 g IV daily- transition to PO with UxCx results.  - f/u UxCx- no results at this time.  - strict I/O   Proctocolitis- likely large stool burden is causing pain and poor PO intake as seen on admission CT abdomen. She had large BM after enema and denies any abdominal pain this am. She states that she feels well and wants to eat today.  - continue bowel regimen as ordered - scheduled antiemetics as she is nonverbal - low threshold to get general surgery on board if worsens - frequent abdominal exams to assess for progression,  - re-image if acute worsening   Functional quadriplegia s/p CVA - PT/OT/SLP - frequent turning to prevent skin breakdown - assist with feeding   Essential hypertension Continue lisinopril and metoprolol    Chronic pain Continue Cymbalta and oxycodone   Stiffman syndrome Continue baclofen   Dysphagia Secondary to stroke Patient is on a pured diet and will require assistance with all meals  Body mass index is 23.98 kg/m.  VTE ppx: enoxaparin (LOVENOX) injection 40 mg Start: 08/01/22 0800  Diet:     Diet   DIET - DYS  1 Room service appropriate? No; Fluid consistency: Thin   Consultants: None   Subjective 08/04/22    Pt reports no complaints or needs this morning. She denies abdominal pain or nausea.    Objective   Vitals:   08/03/22 0002 08/03/22 1003 08/03/22 1600 08/04/22 0001  BP: 131/63 (!) 169/89 137/75 (!) 145/78  Pulse: 74 79 65 (!) 59  Resp: '18 16 16 18  '$ Temp: 98.6 F (37 C) 98.4 F (36.9  C) 99 F (37.2 C) 99.1 F (37.3 C)  TempSrc:      SpO2: 100% 100% 100% 100%  Weight:        Intake/Output Summary (Last 24 hours) at 08/04/2022 0816 Last data filed at 08/04/2022 0603 Gross per 24 hour  Intake 240 ml  Output 700 ml  Net -460 ml    Filed Weights   07/31/22 2255  Weight: 67.4 kg    Physical Exam:  General: awake, alert, NAD at rest HEENT: atraumatic, clear conjunctiva, anicteric sclera, MMM Respiratory: normal respiratory effort. CTAB Cardiovascular: quick capillary refill Gastrointestinal: soft. Prior surgical scars healed. No rash. No tenderness to palpation. Nervous: Alert. Some minimal movement of upper extremities  Extremities:  no edema Skin: dry, intact, normal temperature, normal color. No rashes, lesions or ulcers on exposed skin  Labs   I have personally reviewed the following labs and imaging studies CBC    Component Value Date/Time   WBC 5.0 08/03/2022 0622   RBC 3.28 (L) 08/03/2022 0622   HGB 11.5 (L) 08/03/2022 0622   HGB 8.7 (L) 10/31/2011 1439   HCT 34.1 (L) 08/03/2022 0622   HCT 28.6 (L) 10/31/2011 1439   PLT 200 08/03/2022 0622   PLT 331 10/31/2011 1439   MCV 104.0 (H) 08/03/2022 0622   MCV 72 (L) 10/31/2011 1439   MCH 35.1 (H) 08/03/2022 0622   MCHC 33.7 08/03/2022 0622   RDW 14.7 08/03/2022 0622   RDW 20.5 (H) 10/31/2011 1439   LYMPHSABS 2.1 07/31/2022 2326   MONOABS 0.4 07/31/2022 2326   EOSABS 0.0 07/31/2022 2326   BASOSABS 0.0 07/31/2022 2326   BASOSABS 1 10/21/2011 2219      Latest Ref Rng & Units 08/03/2022    5:11 AM 08/02/2022    4:21 AM 07/31/2022   11:26 PM  BMP  Glucose 70 - 99 mg/dL 76  90  83   BUN 6 - 20 mg/dL '17  20  22   '$ Creatinine 0.44 - 1.00 mg/dL 1.04  1.01  1.24   Sodium 135 - 145 mmol/L 135  138  138   Potassium 3.5 - 5.1 mmol/L 4.5  4.5  4.6   Chloride 98 - 111 mmol/L 101  105  104   CO2 22 - 32 mmol/L '24  23  24   '$ Calcium 8.9 - 10.3 mg/dL 9.2  9.0  9.2     No results found.  Disposition Plan &  Communication  Patient status: Inpatient  Admitted From: SNF Planned disposition location: Skilled nursing facility Anticipated discharge date: 3/4 pending bowel clean out and able to tolerate PO, including able to take seizure medications  Family Communication: daughter on phone    Author: Richarda Osmond, DO [facility] 08/04/2022, 8:16 AM   Available by Epic secure chat 7AM-7PM. If 7PM-7AM, please contact night-coverage.  [facility] contact information found on [email or web address].

## 2022-08-04 NOTE — Progress Notes (Signed)
DISCHARGE NOTE:     Pt discharged with belongings and paperwork placed in packet along with DNR paperwork. EMS provided transportation.

## 2022-08-04 NOTE — TOC Transition Note (Signed)
Transition of Care [facility]) - CM/SW Discharge Note   Patient Details  Name: Daneja Flammia MRN: [id number] Date of Birth: 1966-03-24  [facility] CM/SW Contact:  Candie Chroman, LCSW Phone Number: 08/04/2022, 2:53 PM   Clinical Narrative:  Patient has orders to discharge back to [facility] today where she is a long-term resident. RN will call report to [phone] (Room 106A). EMS transport arranged for around 3:45. No further concerns. CSW signing off.   Final next level of care: [place] Barriers to Discharge: No Barriers Identified   Patient Goals and CMS Choice   Choice offered to / list presented to : Adult Children  Discharge Placement     Existing PASRR number confirmed : 08/04/22          Patient chooses bed at: [facility] Patient to be transferred to facility by: EMS Name of family member notified: Allean Schwiebert Patient and family notified of of transfer: 08/04/22  Discharge Plan and Services Additional resources added to the After Visit Summary for                                       Social Determinants of Health (SDOH) Interventions SDOH Screenings   Tobacco Use: Low Risk  (05/09/2022)     Readmission Risk Interventions     No data to display

## 2022-08-04 NOTE — NC FL2 (Signed)
[place] LEVEL OF CARE FORM     IDENTIFICATION  Patient Name: Katherine Fields Birthdate: [birth year] Sex: female Admission Date (Current Location): 07/31/2022  [place] and [place] Number:  Engineering geologist and Address:  [facility], [address], [place], [place] [address]      Provider Number: [id number]  Attending Physician Name and Address:  Richarda Osmond, MD  Relative Name and Phone Number:       Current Level of Care: Hospital Recommended Level of Care: [place] Prior Approval Number:    Date Approved/Denied:   PASRR Number: [id number]  Discharge Plan: SNF    Current Diagnoses: Patient Active Problem List   Diagnosis Date Noted   Paraplegia ([place]) 08/04/2022   Seizure disorder ([place]) 08/04/2022   Proctocolitis 08/02/2022   Urinary tract infection 08/01/2022   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   G tube feedings ([place]) 08/01/2022   Hypoglycemia 05/09/2022   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   Chronic pain 05/09/2022   UTI (urinary tract infection) 05/09/2022   Essential hypertension 05/09/2022   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 Extra gravy on meats, potatoes.  Pt may have Oatmeal per Speech ok w/ butter/sugar. Extra Spoon/Straw on trays.)  AMBULATORY STATUS COMMUNICATION OF NEEDS Skin     Does not communicate Other (Comment) (Erythema/redness.)                        Personal Care Assistance Level of Assistance              Functional Limitations Info        Speech Info:  (Nonverbal)    SPECIAL CARE FACTORS FREQUENCY                       Contractures Contractures Info: Not present    Additional Factors Info  Code Status, Allergies Code Status Info: DNR Allergies Info: Tramadol           Current Medications (08/04/2022):  This is the current hospital active medication list Current Facility-Administered Medications  Medication Dose Route Frequency Provider Last Rate Last Admin   acetaminophen (TYLENOL) tablet 650 mg  650 mg Oral Q6H PRN Athena Masse, MD       Or   acetaminophen (TYLENOL) suppository 650 mg  650 mg Rectal Q6H PRN Athena Masse, MD       atorvastatin (LIPITOR) tablet 40 mg  40 mg Oral Daily Judd Gaudier V, MD   40 mg at 08/04/22 0944   baclofen (LIORESAL) tablet 5 mg  5 mg Oral Q6H Athena Masse, MD   5 mg at 08/04/22 0945   bisacodyl (DULCOLAX) suppository 10 mg  10 mg Rectal PRN Athena Masse, MD       [START ON 08/05/2022] cephALEXin (KEFLEX) capsule 250 mg  250 mg Oral Q6H Richarda Osmond, MD       divalproex (  DEPAKOTE SPRINKLE) capsule 250 mg  250 mg Oral BID Sharion Settler, NP   250 mg at 08/04/22 0942   DULoxetine (CYMBALTA) DR capsule 40 mg  40 mg Oral BID Athena Masse, MD   40 mg at 08/03/22 2214   enoxaparin (LOVENOX) injection 40 mg  40 mg Subcutaneous Q24H Judd Gaudier V, MD   40 mg at 08/04/22 [id number]   feeding supplement (ENSURE ENLIVE / ENSURE PLUS) liquid 237 mL  237 mL Oral TID BM Richarda Osmond, MD   237 mL at 08/04/22 0947   lacosamide (VIMPAT) tablet 200 mg  200 mg Oral BID Sharion Settler, NP   200 mg at 08/03/22 2212   lactulose ([place]) 10 GM/15ML solution 20 g  20 g Oral BID Athena Masse, MD   20 g at 08/04/22 [id number]   levETIRAcetam (KEPPRA) tablet 1,000 mg  1,000 mg Oral BID Richarda Osmond, MD   1,000 mg at 08/04/22 0942   lisinopril  (ZESTRIL) tablet 20 mg  20 mg Oral Daily Judd Gaudier V, MD   20 mg at 08/04/22 0944   LORazepam (ATIVAN) tablet 0.5 mg  0.5 mg Oral Q6H PRN Athena Masse, MD       metoprolol tartrate (LOPRESSOR) injection 5 mg  5 mg Intravenous Q6H PRN Sharion Settler, NP       metoprolol tartrate (LOPRESSOR) tablet 25 mg  25 mg Oral BID Athena Masse, MD   25 mg at 08/04/22 0944   multivitamin with minerals tablet 1 tablet  1 tablet Oral Daily Richarda Osmond, MD   1 tablet at 08/04/22 0944   ondansetron (ZOFRAN) tablet 4 mg  4 mg Oral Q8H Richarda Osmond, MD   4 mg at 08/03/22 2212   Or   ondansetron (ZOFRAN) injection 4 mg  4 mg Intravenous Q8H Richarda Osmond, MD   4 mg at 08/04/22 0545   oxyCODONE (Oxy IR/ROXICODONE) immediate release tablet 5 mg  5 mg Oral Q4H PRN Athena Masse, MD       pantoprazole (PROTONIX) injection 40 mg  40 mg Intravenous Q12H Benita Gutter, RPH   40 mg at 08/04/22 0944   senna (SENOKOT) tablet 17.2 mg  2 tablet Oral BID Athena Masse, MD   17.2 mg at 08/04/22 0944   zonisamide (ZONEGRAN) capsule 100 mg  100 mg Oral BID Sharion Settler, NP   100 mg at 08/04/22 [id number]     Discharge Medications: Please see discharge summary for a list of discharge medications.  Relevant Imaging Results:  Relevant Lab Results:   Additional Information SS#: [id number]  Candie Chroman, LCSW

## 2022-08-04 NOTE — Plan of Care (Signed)
  Problem: Clinical Measurements: Goal: Diagnostic test results will improve Outcome: Progressing Goal: Cardiovascular complication will be avoided Outcome: Progressing   Problem: Nutrition: Goal: Adequate nutrition will be maintained Outcome: Progressing   Problem: Elimination: Goal: Will not experience complications related to bowel motility Outcome: Progressing   Problem: Pain Managment: Goal: General experience of comfort will improve Outcome: Progressing

## 2022-08-06 LAB — URINE CULTURE: Culture: >=100000 — AB

## 2022-08-31 ENCOUNTER — Emergency Department
Admission: EM | Admit: 2022-08-31 | Discharge: 2022-08-31 | Disposition: A | Discharge status: Home / Self Care | Payer: Medicaid Other | Attending: Emergency Medicine | Admitting: Emergency Medicine

## 2022-08-31 ENCOUNTER — Emergency Department: Payer: Medicaid Other

## 2022-08-31 DIAGNOSIS — R569 Unspecified convulsions: Secondary | ICD-10-CM | POA: Insufficient documentation

## 2022-08-31 DIAGNOSIS — I639 Cerebral infarction, unspecified: Secondary | ICD-10-CM | POA: Insufficient documentation

## 2022-08-31 LAB — URINALYSIS, ROUTINE W REFLEX MICROSCOPIC
Bilirubin Urine: NEGATIVE
Glucose, UA: NEGATIVE mg/dL
Hgb urine dipstick: NEGATIVE
Ketones, ur: NEGATIVE mg/dL
Nitrite: NEGATIVE
Protein, ur: >=300 mg/dL — AB
Specific Gravity, Urine: 1.011 (ref 1.005–1.030)
pH: 7 (ref 5.0–8.0)

## 2022-08-31 LAB — CBC WITH DIFFERENTIAL/PLATELET
Abs Immature Granulocytes: 0.01 10*3/uL (ref 0.00–0.07)
Basophils Absolute: 0 10*3/uL (ref 0.0–0.1)
Basophils Relative: 0 %
Eosinophils Absolute: 0 10*3/uL (ref 0.0–0.5)
Eosinophils Relative: 0 %
HCT: 34.1 % — ABNORMAL LOW (ref 36.0–46.0)
Hemoglobin: 11 g/dL — ABNORMAL LOW (ref 12.0–15.0)
Immature Granulocytes: 0 %
Lymphocytes Relative: 16 %
Lymphs Abs: 0.7 10*3/uL (ref 0.7–4.0)
MCH: 34.8 pg — ABNORMAL HIGH (ref 26.0–34.0)
MCHC: 32.3 g/dL (ref 30.0–36.0)
MCV: 107.9 fL — ABNORMAL HIGH (ref 80.0–100.0)
Monocytes Absolute: 0.2 10*3/uL (ref 0.1–1.0)
Monocytes Relative: 4 %
Neutro Abs: 3.7 10*3/uL (ref 1.7–7.7)
Neutrophils Relative %: 80 %
Platelets: 262 10*3/uL (ref 150–400)
RBC: 3.16 MIL/uL — ABNORMAL LOW (ref 3.87–5.11)
RDW: 14.6 % (ref 11.5–15.5)
WBC: 4.7 10*3/uL (ref 4.0–10.5)
nRBC: 0 % (ref 0.0–0.2)

## 2022-08-31 LAB — COMPREHENSIVE METABOLIC PANEL
ALT: 19 U/L (ref 0–44)
AST: 52 U/L — ABNORMAL HIGH (ref 15–41)
Albumin: 3.6 g/dL (ref 3.5–5.0)
Alkaline Phosphatase: 89 U/L (ref 38–126)
Anion gap: 15 (ref 5–15)
BUN: 26 mg/dL — ABNORMAL HIGH (ref 6–20)
CO2: 15 mmol/L — ABNORMAL LOW (ref 22–32)
Calcium: 9.1 mg/dL (ref 8.9–10.3)
Chloride: 109 mmol/L (ref 98–111)
Creatinine, Ser: 1.28 mg/dL — ABNORMAL HIGH (ref 0.44–1.00)
GFR, Estimated: 49 mL/min — ABNORMAL LOW (ref >60)
Glucose, Bld: 147 mg/dL — ABNORMAL HIGH (ref 70–99)
Potassium: 3.7 mmol/L (ref 3.5–5.1)
Sodium: 139 mmol/L (ref 135–145)
Total Bilirubin: 0.6 mg/dL (ref 0.3–1.2)
Total Protein: 8 g/dL (ref 6.5–8.1)

## 2022-08-31 LAB — CBG MONITORING, ED
Glucose-Capillary: 67 mg/dL — ABNORMAL LOW (ref 70–99)
Glucose-Capillary: 99 mg/dL (ref 70–99)

## 2022-08-31 LAB — MAGNESIUM: Magnesium: 2 mg/dL (ref 1.7–2.4)

## 2022-08-31 LAB — VALPROIC ACID LEVEL: Valproic Acid Lvl: 28 ug/mL — ABNORMAL LOW (ref 50.0–100.0)

## 2022-08-31 MED ORDER — DIVALPROEX SODIUM 125 MG PO CSDR: 375.0000 mg | DELAYED_RELEASE_CAPSULE | Freq: Two times a day (BID) | ORAL | 0 refills | Status: DC

## 2022-08-31 MED ORDER — MIDAZOLAM HCL 2 MG/2ML IJ SOLN
2.0000 mg | Freq: Once | INTRAMUSCULAR | Status: AC
  Administered 2022-08-31: 2 mg via INTRAVENOUS

## 2022-08-31 MED ORDER — DIVALPROEX SODIUM 125 MG PO CSDR
250.0000 mg | DELAYED_RELEASE_CAPSULE | Freq: Once | ORAL | Status: AC
  Administered 2022-08-31: 250 mg via ORAL
  Filled 2022-08-31: qty 2

## 2022-08-31 MED ORDER — MIDAZOLAM HCL (PF) 10 MG/2ML IJ SOLN
INTRAMUSCULAR | Status: AC
  Administered 2022-08-31: 2 mg
  Filled 2022-08-31: qty 2

## 2022-08-31 MED ORDER — LEVETIRACETAM IN NACL 1500 MG/100ML IV SOLN
1500.0000 mg | Freq: Once | INTRAVENOUS | Status: AC
  Administered 2022-08-31: 1500 mg via INTRAVENOUS
  Filled 2022-08-31: qty 100

## 2022-08-31 MED ORDER — DEXTROSE 50 % IV SOLN
1.0000 | Freq: Once | INTRAVENOUS | Status: AC
  Administered 2022-08-31: 50 mL via INTRAVENOUS
  Filled 2022-08-31: qty 50

## 2022-08-31 MED ORDER — LACTATED RINGERS IV BOLUS
1000.0000 mL | Freq: Once | INTRAVENOUS | Status: AC
  Administered 2022-08-31: 1000 mL via INTRAVENOUS

## 2022-08-31 NOTE — ED Notes (Signed)
When this RN went into room to administer ordered depakote, pt does not have G tube. Pt mouth is clamped shut, and pt will not follow simple commands such as "open your mouth." This RN called facility, who states pt is able to take PO meds crushed in applesauce. Upon reassessment, pt did open mouth on request. This RN able to administer Depakote as ordered in applesauce. Tolerated well.

## 2022-08-31 NOTE — ED Notes (Signed)
Seizure precautions in place. O2, suctions, padded side rails

## 2022-08-31 NOTE — ED Provider Notes (Signed)
Emergency department handoff note  Care of this patient was signed out to me at the end of the previous provider shift.  All pertinent patient information was conveyed and all questions were answered.  Patient pending laboratory evaluation showing UA with bacteria and leukocytes however there is multiple squamous cells and therefore likely contaminated.  Patient has had no further seizure activity throughout her emergency department course.  Patient's valproic acid level is now therapeutic at this time and will increase her Depakote sprinkles to 375 twice daily.  Also encouraged to follow-up with neurology as an outpatient for further management of her antiepileptic medications.  Dispo: Discharged to long-term care facility   Naaman Plummer, MD 08/31/22 [phone]

## 2022-08-31 NOTE — ED Triage Notes (Signed)
Pt from [facility] was having seizures for 20 minutes prior to EMS arrival. Per EMS they report grand mal, pt was given 5 mg versed at 0550.

## 2022-08-31 NOTE — ED Notes (Signed)
Attempted to give report to facility, no answer at facility.

## 2022-08-31 NOTE — ED Provider Notes (Signed)
[facility] Provider Note    Event Date/Time   First MD Initiated Contact with Patient 08/31/22 [phone]     (approximate)   History   Seizures (Pt from [facility] was having seizures for 20 minutes prior to EMS arrival. Per EMS they report grand mal, pt was given 5 mg versed at 0550. )   HPI  Katherine Fields is a 57 y.o. female who presents to the ED for evaluation of Seizures (Pt from [facility] was having seizures for 20 minutes prior to EMS arrival. Per EMS they report grand mal, pt was given 5 mg versed at 0550. )   I reviewed outpatient neurology consultation from 2/15.  History of hemorrhagic stroke, seizure disorder and stiff person syndrome.  Nonverbal.  G-tube dependent.  Keppra, Zonegran, Vimpat, Ativan and baclofen  Patient presents to the ED via EMS from her local SNF for evaluation of seizures.  No reported recent illnesses or medication changes.  EMS provided 5 mg of IV Versed with resolution of seizure activity.  The reported leftward eye deviation and generalized tonic-clonic activity  As we transfer her over from the EMS stretcher to our own, we no recurrence of this eye deviation and provide 2 mg of IV Versed with improvement.  She presents with signs and active DNR and MOST form.  MOST form preferring comfort measures  Physical Exam   Triage Vital Signs: ED Triage Vitals  Enc Vitals Group     BP 08/31/22 0619 116/71     Pulse Rate 08/31/22 0619 (!) 118     Resp 08/31/22 0619 11     Temp 08/31/22 0619 (!) 97.4 F (36.3 C)     Temp Source 08/31/22 0619 Axillary     SpO2 08/31/22 0619 96 %     Weight 08/31/22 0610 147 lb 11.3 oz (67 kg)     Height 08/31/22 0610 5\' 6"  (1.676 m)     Head Circumference --      Peak Flow --      Pain Score --      Pain Loc --      Pain Edu? --      Excl. in [place]? --     Most recent vital signs: Vitals:   08/31/22 0619  BP: 116/71  Pulse: (!) 118  Resp: 11  Temp: (!) 97.4 F (36.3 C)  SpO2: 96%     General: Eyes initially deviated to the left, and centralize after providing Versed. CV:  Good peripheral perfusion.  Resp:  Normal effort.  Abd:  No distention.  MSK:  No deformity noted.  No signs of trauma or rash appreciated Neuro:  Initially with eye leftward deviation without any rhythmic or seizure-like activity to the extremities. Other:     ED Results / Procedures / Treatments   Labs (all labs ordered are listed, but only abnormal results are displayed) Labs Reviewed  CBC WITH DIFFERENTIAL/PLATELET - Abnormal; Notable for the following components:      Result Value   RBC 3.16 (*)    Hemoglobin 11.0 (*)    HCT 34.1 (*)    MCV 107.9 (*)    MCH 34.8 (*)    All other components within normal limits  CBG MONITORING, ED - Abnormal; Notable for the following components:   Glucose-Capillary 67 (*)    All other components within normal limits  COMPREHENSIVE METABOLIC PANEL  MAGNESIUM  URINALYSIS, ROUTINE W REFLEX MICROSCOPIC  VALPROIC ACID LEVEL  LEVETIRACETAM LEVEL  LACOSAMIDE  EKG Sinus tachycardia with a rate of 110 bpm.  Normal axis and intervals.  No clear signs of acute ischemia  RADIOLOGY CXR interpreted by me without evidence of acute cardiopulmonary pathology.  Official radiology report(s): DG Chest Portable 1 View  Result Date: 08/31/2022 CLINICAL DATA:  Breakthrough seizure EXAM: PORTABLE CHEST 1 VIEW COMPARISON:  05/09/2022 FINDINGS: Low volume chest. There is no edema, consolidation, effusion, or pneumothorax. Normal heart size and mediastinal contours. IMPRESSION: Negative low volume chest. Electronically Signed   By: Jorje Guild M.D.   On: 08/31/2022 06:45    PROCEDURES and INTERVENTIONS:  .Critical Care  Performed by: Vladimir Crofts, MD Authorized by: Vladimir Crofts, MD   Critical care provider statement:    Critical care time (minutes):  30   Critical care time was exclusive of:  Separately billable procedures and treating other patients    Critical care was necessary to treat or prevent imminent or life-threatening deterioration of the following conditions:  Metabolic crisis and CNS failure or compromise   Critical care was time spent personally by me on the following activities:  Development of treatment plan with patient or surrogate, discussions with consultants, evaluation of patient's response to treatment, examination of patient, ordering and review of laboratory studies, ordering and review of radiographic studies, ordering and performing treatments and interventions, pulse oximetry, re-evaluation of patient's condition and review of old charts .1-3 Lead EKG Interpretation  Performed by: Vladimir Crofts, MD Authorized by: Vladimir Crofts, MD     Interpretation: abnormal     ECG rate:  110   ECG rate assessment: tachycardic     Rhythm: sinus tachycardia     Ectopy: none     Conduction: normal     Medications  levETIRAcetam (KEPPRA) IVPB 1500 mg/ 100 mL premix (1,500 mg Intravenous New Bag/Given 08/31/22 0650)  midazolam (VERSED) injection 2 mg (2 mg Intravenous Given by Other 08/31/22 [id number])  midazolam PF (VERSED) 10 MG/2ML injection (2 mg  Given 08/31/22 0615)  lactated ringers bolus 1,000 mL (1,000 mLs Intravenous New Bag/Given 08/31/22 0634)  dextrose 50 % solution 50 mL (50 mLs Intravenous Given 08/31/22 0643)     IMPRESSION / MDM / ASSESSMENT AND PLAN / ED COURSE  I reviewed the triage vital signs and the nursing notes.  Differential diagnosis includes, but is not limited to, sepsis, breakthrough seizure, trauma, medication noncompliance,  {Patient presents with symptoms of an acute illness or injury that is potentially life-threatening.  57 year old woman with extensive seizure disorder presents after breakthrough seizure.  Requires benzodiazepines on arrival due to persistent seizure activity.  CBG after this is noted to be somewhat low at 67 and she is provided D50.  CXR is clear.  CBC with normal WBC.  Awaiting  remainder of workup from time of signout.  Will provide IV Keppra and send for serum levels of her antiepileptics that we can test for.  Clinical Course as of 08/31/22 0653  Sun Aug 31, 2022  0641 CBG of 67.  Will provide D50 [DS]    Clinical Course User Index [DS] Vladimir Crofts, MD     FINAL CLINICAL IMPRESSION(S) / ED DIAGNOSES   Final diagnoses:  Seizure ([place])     Rx / DC Orders   ED Discharge Orders     None        Note:  This document was prepared using Dragon voice recognition software and may include unintentional dictation errors.   Vladimir Crofts, MD 08/31/22 [phone]

## 2022-08-31 NOTE — ED Notes (Signed)
Per MD and pharmacy, give ordered depakote per g tube.

## 2022-09-02 LAB — LEVETIRACETAM LEVEL: Levetiracetam Lvl: 10.2 ug/mL (ref 10.0–40.0)

## 2022-09-04 LAB — LACOSAMIDE: Lacosamide: 4.5 ug/mL — ABNORMAL LOW (ref 5.0–10.0)

## 2022-12-26 ENCOUNTER — Emergency Department
Admission: EM | Admit: 2022-12-26 | Discharge: 2022-12-26 | Disposition: A | Discharge status: Skilled Nursing Facility | Payer: Medicaid Other | Attending: Student in an Organized Health Care Education/Training Program | Admitting: Student in an Organized Health Care Education/Training Program

## 2022-12-26 ENCOUNTER — Other Ambulatory Visit: Payer: Self-pay

## 2022-12-26 ENCOUNTER — Emergency Department: Payer: Medicaid Other

## 2022-12-26 DIAGNOSIS — R569 Unspecified convulsions: Secondary | ICD-10-CM | POA: Insufficient documentation

## 2022-12-26 LAB — CBC WITH DIFFERENTIAL/PLATELET
Abs Immature Granulocytes: 0.01 10*3/uL (ref 0.00–0.07)
Basophils Absolute: 0 10*3/uL (ref 0.0–0.1)
Basophils Relative: 0 %
Eosinophils Absolute: 0 10*3/uL (ref 0.0–0.5)
Eosinophils Relative: 0 %
HCT: 38.5 % (ref 36.0–46.0)
Hemoglobin: 12.7 g/dL (ref 12.0–15.0)
Immature Granulocytes: 0 %
Lymphocytes Relative: 22 %
Lymphs Abs: 1 10*3/uL (ref 0.7–4.0)
MCH: 34.4 pg — ABNORMAL HIGH (ref 26.0–34.0)
MCHC: 33 g/dL (ref 30.0–36.0)
MCV: 104.3 fL — ABNORMAL HIGH (ref 80.0–100.0)
Monocytes Absolute: 0.5 10*3/uL (ref 0.1–1.0)
Monocytes Relative: 11 %
Neutro Abs: 3 10*3/uL (ref 1.7–7.7)
Neutrophils Relative %: 67 %
Platelets: 205 10*3/uL (ref 150–400)
RBC: 3.69 MIL/uL — ABNORMAL LOW (ref 3.87–5.11)
RDW: 14.9 % (ref 11.5–15.5)
WBC: 4.6 10*3/uL (ref 4.0–10.5)
nRBC: 0 % (ref 0.0–0.2)

## 2022-12-26 LAB — BASIC METABOLIC PANEL
Anion gap: 12 (ref 5–15)
BUN: 19 mg/dL (ref 6–20)
CO2: 20 mmol/L — ABNORMAL LOW (ref 22–32)
Calcium: 9.2 mg/dL (ref 8.9–10.3)
Chloride: 107 mmol/L (ref 98–111)
Creatinine, Ser: 1.16 mg/dL — ABNORMAL HIGH (ref 0.44–1.00)
GFR, Estimated: 55 mL/min — ABNORMAL LOW (ref >60)
Glucose, Bld: 84 mg/dL (ref 70–99)
Potassium: 3.9 mmol/L (ref 3.5–5.1)
Sodium: 139 mmol/L (ref 135–145)

## 2022-12-26 LAB — VALPROIC ACID LEVEL: Valproic Acid Lvl: 41 ug/mL — ABNORMAL LOW (ref 50.0–100.0)

## 2022-12-26 MED ORDER — LORAZEPAM 0.5 MG PO TABS
0.5000 mg | ORAL_TABLET | Freq: Once | ORAL | Status: AC
  Administered 2022-12-26: 0.5 mg via ORAL
  Filled 2022-12-26: qty 1

## 2022-12-26 MED ORDER — LEVETIRACETAM IN NACL 1000 MG/100ML IV SOLN
1000.0000 mg | Freq: Once | INTRAVENOUS | Status: AC
  Administered 2022-12-26: 1000 mg via INTRAVENOUS
  Filled 2022-12-26: qty 100

## 2022-12-26 MED ORDER — SODIUM CHLORIDE 0.9 % IV BOLUS
1000.0000 mL | Freq: Once | INTRAVENOUS | Status: AC
  Administered 2022-12-26: 1000 mL via INTRAVENOUS

## 2022-12-26 MED ORDER — DIVALPROEX SODIUM 125 MG PO CSDR
375.0000 mg | DELAYED_RELEASE_CAPSULE | Freq: Once | ORAL | Status: AC
  Administered 2022-12-26: 375 mg via ORAL
  Filled 2022-12-26: qty 3

## 2022-12-26 NOTE — ED Triage Notes (Signed)
Pt arrives from [facility] via [facility] with c/o 4 seizures this morning. Pt had 2 before EMS was called, 1 with fire and 1 when EMS got on scene. 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 Pt takes ativan q4hr at baseline and keppra. Pt was 92% on RA, and came up to 98% on 2L for EMS. Pt is alert at baseline and will answer yes or no questions. Pt is postictal at this time.

## 2022-12-26 NOTE — ED Provider Notes (Signed)
[facility] Provider Note    Event Date/Time   First MD Initiated Contact with Patient 12/26/22 [phone]     (approximate)   History   Seizures   HPI  Katherine Fields is a 57 y.o. female with a history of seizure presents to the syndrome presents from facility for 4 episodes of seizure this morning.  Patient postictal on arrival.  She does open eyes and follow simple commands but does seem postictal and drowsy.  No reported fevers by EMS no report of trauma.     Physical Exam   Triage Vital Signs: ED Triage Vitals  Encounter Vitals Group     BP      Systolic BP Percentile      Diastolic BP Percentile      Pulse      Resp      Temp      Temp src      SpO2      Weight      Height      Head Circumference      Peak Flow      Pain Score      Pain Loc      Pain Education      Exclude from Growth Chart     Most recent vital signs: Vitals:   12/26/22 1300 12/26/22 1330  BP: 114/72 113/69  Pulse: 88 89  Resp: 20 18  Temp:    SpO2: 100% 100%     Constitutional: postictal, chronically ill appearing Eyes: Conjunctivae are normal.  Head: Atraumatic. Nose: No congestion/rhinnorhea. Mouth/Throat: Mucous membranes are moist.   Neck: Painless ROM.  Cardiovascular:   Good peripheral circulation. Respiratory: Normal respiratory effort.  No retractions.  Gastrointestinal: Soft and nontender.  Musculoskeletal:  no deformity Neurologic:  chronic spasticity. No new gross focal neurologic deficits are appreciated.  Skin:  Skin is warm, dry and intact. No rash noted.    ED Results / Procedures / Treatments   Labs (all labs ordered are listed, but only abnormal results are displayed) Labs Reviewed  VALPROIC ACID LEVEL - Abnormal; Notable for the following components:      Result Value   Valproic Acid Lvl 41 (*)    All other components within normal limits  CBC WITH DIFFERENTIAL/PLATELET - Abnormal; Notable for the following components:   RBC  3.69 (*)    MCV 104.3 (*)    MCH 34.4 (*)    All other components within normal limits  BASIC METABOLIC PANEL - Abnormal; Notable for the following components:   CO2 20 (*)    Creatinine, Ser 1.16 (*)    GFR, Estimated 55 (*)    All other components within normal limits     EKG  ED ECG REPORT I, Willy Eddy, the attending physician, personally viewed and interpreted this ECG.   Date: 12/26/2022  EKG Time: 9:40  Rate: 110  Rhythm: sinus  Axis: normal  Intervals: normal  ST&T Change: no stemi, no depressions    RADIOLOGY Please see ED Course for my review and interpretation.  I personally reviewed all radiographic images ordered to evaluate for the above acute complaints and reviewed radiology reports and findings.  These findings were personally discussed with the patient.  Please see medical record for radiology report.    PROCEDURES:  Critical Care performed: No  Procedures   MEDICATIONS ORDERED IN ED: Medications  levETIRAcetam (KEPPRA) IVPB 1000 mg/100 mL premix (0 mg Intravenous Stopped 12/26/22 1047)  sodium  chloride 0.9 % bolus 1,000 mL (0 mLs Intravenous Stopped 12/26/22 1221)  divalproex (DEPAKOTE SPRINKLE) capsule 375 mg (375 mg Oral Given 12/26/22 1322)  LORazepam (ATIVAN) tablet 0.5 mg (0.5 mg Oral Given 12/26/22 1322)     IMPRESSION / MDM / ASSESSMENT AND PLAN / ED COURSE  I reviewed the triage vital signs and the nursing notes.                              Differential diagnosis includes, but is not limited to, seizure, withdrawal, CVA, bleed, electrolyte abnormality, sepsis  Patient presenting to the ER for evaluation of symptoms as described above.  Based on symptoms, risk factors and considered above differential, this presenting complaint could reflect a potentially life-threatening illness therefore the patient will be placed on continuous pulse oximetry and telemetry for monitoring.  Laboratory evaluation will be sent to evaluate for the  above complaints.      Clinical Course as of 12/26/22 1539  Fri Dec 26, 2022  1049 Depakote level is subtherapeutic will give additional dose of Depakote.  Will continue to observe.  CT head on my review and interpretation without evidence of mass or bleed. [PR]  1236 Patient has been observed in the ER.  No additional seizure episodes.  Appears to be at her baseline.  Attempted to contact family members.  She remains hemodynamically stable.  Does appear appropriate for continued outpatient follow-up as she does not appear to be showing signs of status [PR]  1236 . [PR]    Clinical Course User Index [PR] Willy Eddy, MD     FINAL CLINICAL IMPRESSION(S) / ED DIAGNOSES   Final diagnoses:  Seizure (HCC)     Rx / DC Orders   ED Discharge Orders     None        Note:  This document was prepared using Dragon voice recognition software and may include unintentional dictation errors.    Willy Eddy, MD 12/26/22 1539

## 2023-01-02 ENCOUNTER — Other Ambulatory Visit: Payer: Self-pay

## 2023-01-02 ENCOUNTER — Emergency Department
Admission: EM | Admit: 2023-01-02 | Discharge: 2023-01-02 | Disposition: A | Discharge status: Skilled Nursing Facility | Payer: Medicaid Other | Source: Home / Self Care | Attending: Student in an Organized Health Care Education/Training Program | Admitting: Student in an Organized Health Care Education/Training Program

## 2023-01-02 DIAGNOSIS — R569 Unspecified convulsions: Secondary | ICD-10-CM | POA: Diagnosis present

## 2023-01-02 LAB — CBC WITH DIFFERENTIAL/PLATELET
Abs Immature Granulocytes: 0.01 10*3/uL (ref 0.00–0.07)
Basophils Absolute: 0 10*3/uL (ref 0.0–0.1)
Basophils Relative: 0 %
Eosinophils Absolute: 0 10*3/uL (ref 0.0–0.5)
Eosinophils Relative: 0 %
HCT: 36 % (ref 36.0–46.0)
Hemoglobin: 12.2 g/dL (ref 12.0–15.0)
Immature Granulocytes: 0 %
Lymphocytes Relative: 13 %
Lymphs Abs: 0.8 10*3/uL (ref 0.7–4.0)
MCH: 35.1 pg — ABNORMAL HIGH (ref 26.0–34.0)
MCHC: 33.9 g/dL (ref 30.0–36.0)
MCV: 103.4 fL — ABNORMAL HIGH (ref 80.0–100.0)
Monocytes Absolute: 0.4 10*3/uL (ref 0.1–1.0)
Monocytes Relative: 6 %
Neutro Abs: 4.9 10*3/uL (ref 1.7–7.7)
Neutrophils Relative %: 81 %
Platelets: 193 10*3/uL (ref 150–400)
RBC: 3.48 MIL/uL — ABNORMAL LOW (ref 3.87–5.11)
RDW: 14.9 % (ref 11.5–15.5)
WBC: 6 10*3/uL (ref 4.0–10.5)
nRBC: 0 % (ref 0.0–0.2)

## 2023-01-02 LAB — COMPREHENSIVE METABOLIC PANEL
ALT: 14 U/L (ref 0–44)
AST: 25 U/L (ref 15–41)
Albumin: 3.6 g/dL (ref 3.5–5.0)
Alkaline Phosphatase: 94 U/L (ref 38–126)
Anion gap: 12 (ref 5–15)
BUN: 27 mg/dL — ABNORMAL HIGH (ref 6–20)
CO2: 19 mmol/L — ABNORMAL LOW (ref 22–32)
Calcium: 9.1 mg/dL (ref 8.9–10.3)
Chloride: 108 mmol/L (ref 98–111)
Creatinine, Ser: 1.27 mg/dL — ABNORMAL HIGH (ref 0.44–1.00)
GFR, Estimated: 50 mL/min — ABNORMAL LOW (ref >60)
Glucose, Bld: 122 mg/dL — ABNORMAL HIGH (ref 70–99)
Potassium: 3.7 mmol/L (ref 3.5–5.1)
Sodium: 139 mmol/L (ref 135–145)
Total Bilirubin: 0.3 mg/dL (ref 0.3–1.2)
Total Protein: 7.7 g/dL (ref 6.5–8.1)

## 2023-01-02 LAB — VALPROIC ACID LEVEL: Valproic Acid Lvl: 21 ug/mL — ABNORMAL LOW (ref 50.0–100.0)

## 2023-01-02 MED ORDER — VALPROATE SODIUM 100 MG/ML IV SOLN
1000.0000 mg | INTRAVENOUS | Status: DC
  Administered 2023-01-02: 1000 mg via INTRAVENOUS
  Filled 2023-01-02: qty 10

## 2023-01-02 MED ORDER — LEVETIRACETAM IN NACL 1000 MG/100ML IV SOLN
1000.0000 mg | Freq: Once | INTRAVENOUS | Status: AC
  Administered 2023-01-02: 1000 mg via INTRAVENOUS
  Filled 2023-01-02: qty 100

## 2023-01-02 MED ORDER — SODIUM CHLORIDE 0.9 % IV BOLUS
500.0000 mL | Freq: Once | INTRAVENOUS | Status: AC
  Administered 2023-01-02: 500 mL via INTRAVENOUS

## 2023-01-02 NOTE — Discharge Instructions (Signed)
Your Depakote level today was low.  We have given you an extra dose of IV Keppra and IV Depakote to help bring these levels up.  Follow-up with neurology.

## 2023-01-02 NOTE — ED Notes (Signed)
[facility]  CALLED  FOR  TRANSPORT  TO  [facility] 

## 2023-01-02 NOTE — ED Provider Notes (Signed)
[facility] Provider Note    Event Date/Time   First MD Initiated Contact with Patient 01/02/23 1352     (approximate)   History   Seizures   HPI  Katherine Fields is a 57 y.o. female with an extensive past medical history with increasing seizure burden over the past few weeks presents to the ER for another witnessed generalized tonic-clonic seizure.  Given a dose of 0.5 mg of Ativan prior to EMS arrival which stopped the seizure.  Now at baseline.  No report of any fevers no trauma.     Physical Exam   Triage Vital Signs: ED Triage Vitals  Encounter Vitals Group     BP 01/02/23 1404 116/80     Systolic BP Percentile --      Diastolic BP Percentile --      Pulse Rate 01/02/23 1404 (!) 120     Resp 01/02/23 1404 (!) 24     Temp 01/02/23 1404 98.6 F (37 C)     Temp Source 01/02/23 1404 Oral     SpO2 01/02/23 1404 98 %     Weight 01/02/23 1403 145 lb (65.8 kg)     Height 01/02/23 1403 5\' 6"  (1.676 m)     Head Circumference --      Peak Flow --      Pain Score --      Pain Loc --      Pain Education --      Exclude from Growth Chart --     Most recent vital signs: Vitals:   01/02/23 1404  BP: 116/80  Pulse: (!) 120  Resp: (!) 24  Temp: 98.6 F (37 C)  SpO2: 98%     Constitutional: chronically ill appearing Eyes: Conjunctivae are normal.  Head: Atraumatic. Nose: No congestion/rhinnorhea. Mouth/Throat: Mucous membranes are moist.   Cardiovascular:   Good peripheral circulation. Respiratory: Normal respiratory effort.  No retractions.  Gastrointestinal: Soft and nontender.  Neurologic:  chronic spasticity, no new deficits Skin:  Skin is warm, dry and intact. No rash noted.     ED Results / Procedures / Treatments   Labs (all labs ordered are listed, but only abnormal results are displayed) Labs Reviewed  CBC WITH DIFFERENTIAL/PLATELET - Abnormal; Notable for the following components:      Result Value   RBC 3.48 (*)     MCV 103.4 (*)    MCH 35.1 (*)    All other components within normal limits  COMPREHENSIVE METABOLIC PANEL - Abnormal; Notable for the following components:   CO2 19 (*)    Glucose, Bld 122 (*)    BUN 27 (*)    Creatinine, Ser 1.27 (*)    GFR, Estimated 50 (*)    All other components within normal limits  VALPROIC ACID LEVEL - Abnormal; Notable for the following components:   Valproic Acid Lvl 21 (*)    All other components within normal limits  LEVETIRACETAM LEVEL     EKG  ED ECG REPORT I, Willy Eddy, the attending physician, personally viewed and interpreted this ECG.   Date: 01/02/2023  EKG Time: 14:02  Rate: 120  Rhythm: sinus  Axis: normal  Intervals: prolonged qt  ST&T Change: no stemi    RADIOLOGY    PROCEDURES:  Critical Care performed:   Procedures   MEDICATIONS ORDERED IN ED: Medications  valproate (DEPACON) 1,000 mg in dextrose 5 % 50 mL IVPB (has no administration in time range)  sodium chloride 0.9 %  bolus 500 mL (has no administration in time range)  levETIRAcetam (KEPPRA) IVPB 1000 mg/100 mL premix (0 mg Intravenous Stopped 01/02/23 1429)     IMPRESSION / MDM / ASSESSMENT AND PLAN / ED COURSE  I reviewed the triage vital signs and the nursing notes.                              Differential diagnosis includes, but is not limited to, seizure, noncompliance, electrolyte abnormality, sepsis, dysrhythmia  Patient presenting to the ER for evaluation of symptoms as described above.  Based on symptoms, risk factors and considered above differential, this presenting complaint could reflect a potentially life-threatening illness therefore the patient will be placed on continuous pulse oximetry and telemetry for monitoring.  Laboratory evaluation will be sent to evaluate for the above complaints.     Clinical Course as of 01/02/23 1512  Fri Jan 02, 2023  1419 I was able to speak with the patient's nurse practitioner at the facility.  There has  been some concern about missed doses there is a plan to switch Keppra to tablet due to concern for not receiving enough of the solutions.  Her Depakote level has been running low.  Will repeat today and redose IV if needed. [PR]  1427 Family did confirm that patient received all of her medications this morning. [PR]  1436 Electrolytes appear to be at baseline.  No leukocytosis.  Patient without any evidence of status at this time. [PR]    Clinical Course User Index [PR] Willy Eddy, MD     FINAL CLINICAL IMPRESSION(S) / ED DIAGNOSES   Final diagnoses:  Seizure (HCC)     Rx / DC Orders   ED Discharge Orders     None        Note:  This document was prepared using Dragon voice recognition software and may include unintentional dictation errors.    Willy Eddy, MD 01/02/23 [phone]

## 2023-01-02 NOTE — ED Triage Notes (Signed)
Pt arrives via EMS from [facility]. Pt is here due to a seizure. Pt was given 0.5mg  of Ativan prior to EMS arrival which stopped pt seizure. Pt is at baseline at this time. Per EMS, all VS are WNL. Pt is non verbal and has stiff limbs at baseline.

## 2023-01-02 NOTE — ED Notes (Signed)
RN attempted to call report to [facility]. No Response

## 2023-02-23 ENCOUNTER — Emergency Department: Payer: Medicaid Other

## 2023-02-23 ENCOUNTER — Other Ambulatory Visit: Payer: Self-pay

## 2023-02-23 ENCOUNTER — Inpatient Hospital Stay
Admission: EM | Admit: 2023-02-23 | Discharge: 2023-03-05 | DRG: 056 | Disposition: A | Discharge status: Hospice | Payer: Medicaid Other | Source: Skilled Nursing Facility | Attending: Internal Medicine | Admitting: Internal Medicine

## 2023-02-23 DIAGNOSIS — E785 Hyperlipidemia, unspecified: Secondary | ICD-10-CM | POA: Diagnosis present

## 2023-02-23 DIAGNOSIS — Z820 Family history of epilepsy and other diseases of the nervous system: Secondary | ICD-10-CM

## 2023-02-23 DIAGNOSIS — I129 Hypertensive chronic kidney disease with stage 1 through stage 4 chronic kidney disease, or unspecified chronic kidney disease: Secondary | ICD-10-CM | POA: Diagnosis present

## 2023-02-23 DIAGNOSIS — Z888 Allergy status to other drugs, medicaments and biological substances status: Secondary | ICD-10-CM

## 2023-02-23 DIAGNOSIS — G40111 Localization-related (focal) (partial) symptomatic epilepsy and epileptic syndromes with simple partial seizures, intractable, with status epilepticus: Secondary | ICD-10-CM | POA: Diagnosis present

## 2023-02-23 DIAGNOSIS — Z79899 Other long term (current) drug therapy: Secondary | ICD-10-CM

## 2023-02-23 DIAGNOSIS — I69319 Unspecified symptoms and signs involving cognitive functions following cerebral infarction: Secondary | ICD-10-CM

## 2023-02-23 DIAGNOSIS — R680 Hypothermia, not associated with low environmental temperature: Secondary | ICD-10-CM | POA: Diagnosis not present

## 2023-02-23 DIAGNOSIS — Z515 Encounter for palliative care: Secondary | ICD-10-CM

## 2023-02-23 DIAGNOSIS — R1310 Dysphagia, unspecified: Secondary | ICD-10-CM | POA: Diagnosis present

## 2023-02-23 DIAGNOSIS — N3000 Acute cystitis without hematuria: Secondary | ICD-10-CM | POA: Diagnosis present

## 2023-02-23 DIAGNOSIS — G8929 Other chronic pain: Secondary | ICD-10-CM | POA: Diagnosis present

## 2023-02-23 DIAGNOSIS — R532 Functional quadriplegia: Secondary | ICD-10-CM | POA: Diagnosis present

## 2023-02-23 DIAGNOSIS — J449 Chronic obstructive pulmonary disease, unspecified: Secondary | ICD-10-CM | POA: Diagnosis present

## 2023-02-23 DIAGNOSIS — G8321 Monoplegia of upper limb affecting right dominant side: Secondary | ICD-10-CM | POA: Diagnosis present

## 2023-02-23 DIAGNOSIS — I69398 Other sequelae of cerebral infarction: Principal | ICD-10-CM

## 2023-02-23 DIAGNOSIS — R636 Underweight: Secondary | ICD-10-CM | POA: Diagnosis present

## 2023-02-23 DIAGNOSIS — R569 Unspecified convulsions: Secondary | ICD-10-CM | POA: Diagnosis not present

## 2023-02-23 DIAGNOSIS — G928 Other toxic encephalopathy: Secondary | ICD-10-CM | POA: Diagnosis present

## 2023-02-23 DIAGNOSIS — N1831 Chronic kidney disease, stage 3a: Secondary | ICD-10-CM | POA: Diagnosis present

## 2023-02-23 DIAGNOSIS — G2582 Stiff-man syndrome: Secondary | ICD-10-CM | POA: Diagnosis present

## 2023-02-23 DIAGNOSIS — I69391 Dysphagia following cerebral infarction: Secondary | ICD-10-CM

## 2023-02-23 DIAGNOSIS — Z6824 Body mass index (BMI) 24.0-24.9, adult: Secondary | ICD-10-CM

## 2023-02-23 DIAGNOSIS — R29810 Facial weakness: Secondary | ICD-10-CM | POA: Diagnosis not present

## 2023-02-23 DIAGNOSIS — I2510 Atherosclerotic heart disease of native coronary artery without angina pectoris: Secondary | ICD-10-CM | POA: Diagnosis present

## 2023-02-23 DIAGNOSIS — E538 Deficiency of other specified B group vitamins: Secondary | ICD-10-CM | POA: Diagnosis present

## 2023-02-23 DIAGNOSIS — Z66 Do not resuscitate: Secondary | ICD-10-CM | POA: Diagnosis present

## 2023-02-23 DIAGNOSIS — R5381 Other malaise: Secondary | ICD-10-CM | POA: Diagnosis present

## 2023-02-23 LAB — CBC
HCT: 33.2 % — ABNORMAL LOW (ref 36.0–46.0)
Hemoglobin: 11.1 g/dL — ABNORMAL LOW (ref 12.0–15.0)
MCH: 35.8 pg — ABNORMAL HIGH (ref 26.0–34.0)
MCHC: 33.4 g/dL (ref 30.0–36.0)
MCV: 107.1 fL — ABNORMAL HIGH (ref 80.0–100.0)
Platelets: 221 10*3/uL (ref 150–400)
RBC: 3.1 MIL/uL — ABNORMAL LOW (ref 3.87–5.11)
RDW: 14.7 % (ref 11.5–15.5)
WBC: 6.2 10*3/uL (ref 4.0–10.5)
nRBC: 0 % (ref 0.0–0.2)

## 2023-02-23 LAB — URINALYSIS, ROUTINE W REFLEX MICROSCOPIC
Bilirubin Urine: NEGATIVE
Glucose, UA: NEGATIVE mg/dL
Hgb urine dipstick: NEGATIVE
Ketones, ur: 5 mg/dL — AB
Leukocytes,Ua: NEGATIVE
Nitrite: NEGATIVE
Protein, ur: 100 mg/dL — AB
Specific Gravity, Urine: 1.02 (ref 1.005–1.030)
pH: 7 (ref 5.0–8.0)

## 2023-02-23 LAB — AMMONIA: Ammonia: 33 umol/L (ref 9–35)

## 2023-02-23 LAB — COMPREHENSIVE METABOLIC PANEL
ALT: 14 U/L (ref 0–44)
AST: 28 U/L (ref 15–41)
Albumin: 3.5 g/dL (ref 3.5–5.0)
Alkaline Phosphatase: 114 U/L (ref 38–126)
Anion gap: 14 (ref 5–15)
BUN: 24 mg/dL — ABNORMAL HIGH (ref 6–20)
CO2: 18 mmol/L — ABNORMAL LOW (ref 22–32)
Calcium: 9.1 mg/dL (ref 8.9–10.3)
Chloride: 105 mmol/L (ref 98–111)
Creatinine, Ser: 1.15 mg/dL — ABNORMAL HIGH (ref 0.44–1.00)
GFR, Estimated: 56 mL/min — ABNORMAL LOW (ref >60)
Glucose, Bld: 104 mg/dL — ABNORMAL HIGH (ref 70–99)
Potassium: 3.7 mmol/L (ref 3.5–5.1)
Sodium: 137 mmol/L (ref 135–145)
Total Bilirubin: 0.6 mg/dL (ref 0.3–1.2)
Total Protein: 7.8 g/dL (ref 6.5–8.1)

## 2023-02-23 LAB — VALPROIC ACID LEVEL: Valproic Acid Lvl: 39 ug/mL — ABNORMAL LOW (ref 50.0–100.0)

## 2023-02-23 MED ORDER — ONDANSETRON HCL 4 MG PO TABS: 4.0000 mg | ORAL_TABLET | Freq: Four times a day (QID) | ORAL | Status: DC | PRN

## 2023-02-23 MED ORDER — SODIUM CHLORIDE 0.9 % IV SOLN
2.0000 g | Freq: Once | INTRAVENOUS | Status: AC
  Administered 2023-02-23: 2 g via INTRAVENOUS
  Filled 2023-02-23: qty 20

## 2023-02-23 MED ORDER — LEVETIRACETAM IN NACL 1000 MG/100ML IV SOLN
1000.0000 mg | Freq: Once | INTRAVENOUS | Status: AC
  Administered 2023-02-23: 1000 mg via INTRAVENOUS
  Filled 2023-02-23: qty 100

## 2023-02-23 MED ORDER — ONDANSETRON HCL 4 MG/2ML IJ SOLN: 4.0000 mg | Freq: Four times a day (QID) | INTRAMUSCULAR | Status: DC | PRN

## 2023-02-23 MED ORDER — ORAL CARE MOUTH RINSE: 15.0000 mL | OROMUCOSAL | Status: DC | PRN

## 2023-02-23 MED ORDER — SODIUM CHLORIDE 0.9 % IV BOLUS
1000.0000 mL | Freq: Once | INTRAVENOUS | Status: AC
  Administered 2023-02-23: 1000 mL via INTRAVENOUS

## 2023-02-23 MED ORDER — LEVETIRACETAM IN NACL 1500 MG/100ML IV SOLN
1500.0000 mg | Freq: Two times a day (BID) | INTRAVENOUS | Status: DC
  Administered 2023-02-24 – 2023-02-26 (×6): 1500 mg via INTRAVENOUS
  Filled 2023-02-23 (×7): qty 100

## 2023-02-23 MED ORDER — VALPROATE SODIUM 100 MG/ML IV SOLN
500.0000 mg | Freq: Two times a day (BID) | INTRAVENOUS | Status: DC
  Administered 2023-02-23 – 2023-02-25 (×4): 500 mg via INTRAVENOUS
  Filled 2023-02-23 (×5): qty 5

## 2023-02-23 MED ORDER — GABAPENTIN 100 MG PO CAPS
100.0000 mg | ORAL_CAPSULE | Freq: Three times a day (TID) | ORAL | Status: DC
  Administered 2023-02-23 – 2023-02-24 (×2): 100 mg via ORAL
  Filled 2023-02-23 (×2): qty 1

## 2023-02-23 MED ORDER — DULOXETINE HCL 40 MG PO CPEP: 1.0000 | ORAL_CAPSULE | Freq: Every day | ORAL | Status: DC

## 2023-02-23 MED ORDER — ENOXAPARIN SODIUM 40 MG/0.4ML IJ SOSY
40.0000 mg | PREFILLED_SYRINGE | INTRAMUSCULAR | Status: DC
  Administered 2023-02-23 – 2023-03-04 (×10): 40 mg via SUBCUTANEOUS
  Filled 2023-02-23 (×10): qty 0.4

## 2023-02-23 MED ORDER — BISACODYL 10 MG RE SUPP
10.0000 mg | RECTAL | Status: DC | PRN
  Administered 2023-02-28: 10 mg via RECTAL
  Filled 2023-02-23: qty 1

## 2023-02-23 MED ORDER — ZONISAMIDE 100 MG PO CAPS: 200.0000 mg | ORAL_CAPSULE | Freq: Every day | ORAL | Status: DC

## 2023-02-23 MED ORDER — SODIUM CHLORIDE 0.9 % IV SOLN
1.0000 g | INTRAVENOUS | Status: DC
  Administered 2023-02-24 – 2023-02-27 (×4): 1 g via INTRAVENOUS
  Filled 2023-02-23 (×4): qty 10

## 2023-02-23 MED ORDER — LACOSAMIDE 50 MG PO TABS: 200.0000 mg | ORAL_TABLET | Freq: Two times a day (BID) | ORAL | Status: DC

## 2023-02-23 MED ORDER — ZONISAMIDE 100 MG PO CAPS
200.0000 mg | ORAL_CAPSULE | Freq: Two times a day (BID) | ORAL | Status: DC
  Administered 2023-02-23 – 2023-02-24 (×2): 200 mg via ORAL
  Filled 2023-02-23 (×4): qty 2

## 2023-02-23 MED ORDER — DIVALPROEX SODIUM 125 MG PO CSDR: 375.0000 mg | DELAYED_RELEASE_CAPSULE | Freq: Two times a day (BID) | ORAL | Status: DC

## 2023-02-23 MED ORDER — DULOXETINE HCL 20 MG PO CPEP
40.0000 mg | ORAL_CAPSULE | Freq: Every day | ORAL | Status: DC
  Administered 2023-02-24: 40 mg via ORAL
  Filled 2023-02-23: qty 2

## 2023-02-23 MED ORDER — SODIUM CHLORIDE 0.9 % IV SOLN: 75.0000 mL/h | INTRAVENOUS | Status: DC

## 2023-02-23 MED ORDER — VALPROATE SODIUM 100 MG/ML IV SOLN: 375.0000 mg | Freq: Two times a day (BID) | INTRAVENOUS | Status: DC

## 2023-02-23 MED ORDER — LEVETIRACETAM 500 MG PO TABS: 1500.0000 mg | ORAL_TABLET | Freq: Two times a day (BID) | ORAL | Status: DC

## 2023-02-23 MED ORDER — ORAL CARE MOUTH RINSE
15.0000 mL | OROMUCOSAL | Status: DC
  Administered 2023-02-24 – 2023-02-26 (×10): 15 mL via OROMUCOSAL
  Filled 2023-02-23 (×15): qty 15

## 2023-02-23 MED ORDER — SODIUM CHLORIDE 0.9 % IV SOLN
200.0000 mg | Freq: Two times a day (BID) | INTRAVENOUS | Status: DC
  Administered 2023-02-23 – 2023-02-26 (×6): 200 mg via INTRAVENOUS
  Filled 2023-02-23 (×6): qty 20

## 2023-02-23 MED ORDER — SODIUM CHLORIDE 0.9 % IV SOLN: INTRAVENOUS | Status: AC

## 2023-02-23 MED ORDER — BACLOFEN 10 MG PO TABS
10.0000 mg | ORAL_TABLET | Freq: Two times a day (BID) | ORAL | Status: DC
  Administered 2023-02-23 – 2023-02-24 (×2): 10 mg via ORAL
  Filled 2023-02-23 (×2): qty 1

## 2023-02-23 NOTE — ED Notes (Signed)
[facility] for Neurology outpatient consult, talked with Desma , waiting on a call back. 13:25

## 2023-02-23 NOTE — ED Provider Notes (Signed)
[facility] Provider Note    Event Date/Time   First MD Initiated Contact with Patient 02/23/23 1243     (approximate)   History   Seizures (History of seizures - Patient brought in by EMS from [facility] for seizures; Was given Ativan 1 mg IM by WOM, patient continued to have focal seizures, IV was established by EMS and an additional Versed 5 mg IV was administered; Patient is somnolent upon arrival but does respond to verbal stimuli, Pupils are equal, round and reactive)   HPI  [facility] is a 57 y.o. female  here with seizures. Pt has extensive history including prior CVA, seizure d/o. Per report, pt had a witnessed generalized seizure today at [facility]. Was given ativan 1 mg IM. With EMS, was having "focal seizures" though unclear on description unfortunately. Pt was given an additional 5 mg IV versed with resolution. Pt responsive ot painful stimuli now. She is nonverbal at baseline.        Physical Exam   Triage Vital Signs: ED Triage Vitals  Encounter Vitals Group     BP 02/23/23 1222 96/68     Systolic BP Percentile --      Diastolic BP Percentile --      Pulse Rate 02/23/23 1222 (!) 111     Resp 02/23/23 1222 (!) 23     Temp 02/23/23 1222 99 F (37.2 C)     Temp Source 02/23/23 1222 Oral     SpO2 02/23/23 1222 97 %     Weight 02/23/23 1225 149 lb 14.4 oz (68 kg)     Height 02/23/23 1225 5\' 6"  (1.676 m)     Head Circumference --      Peak Flow --      Pain Score --      Pain Loc --      Pain Education --      Exclude from Growth Chart --     Most recent vital signs: Vitals:   02/23/23 1222 02/23/23 1500  BP: 96/68 (!) 140/80  Pulse: (!) 111 92  Resp: (!) 23 18  Temp: 99 F (37.2 C)   SpO2: 97% 100%     General: Awake, no distress.  CV:  Good peripheral perfusion. RRR. Resp:  Normal work of breathing. Lungs clear. Abd:  No distention. No tenderness. Other:  No apparent distress. Eyes roaming but mostly leftward gaze,  though blinks to threat b/l. Face appears symmetric. Jaw held tight. Increased tone b/l UE and LE but no tremors/seizure like activity. Unable to participate in exam but does withdraw to painful stimuli b/l ue and le.   ED Results / Procedures / Treatments   Labs (all labs ordered are listed, but only abnormal results are displayed) Labs Reviewed  VALPROIC ACID LEVEL - Abnormal; Notable for the following components:      Result Value   Valproic Acid Lvl 39 (*)    All other components within normal limits  CBC - Abnormal; Notable for the following components:   RBC 3.10 (*)    Hemoglobin 11.1 (*)    HCT 33.2 (*)    MCV 107.1 (*)    MCH 35.8 (*)    All other components within normal limits  COMPREHENSIVE METABOLIC PANEL - Abnormal; Notable for the following components:   CO2 18 (*)    Glucose, Bld 104 (*)    BUN 24 (*)    Creatinine, Ser 1.15 (*)    GFR, Estimated  56 (*)    All other components within normal limits  URINALYSIS, ROUTINE W REFLEX MICROSCOPIC - Abnormal; Notable for the following components:   Color, Urine YELLOW (*)    APPearance HAZY (*)    Ketones, ur 5 (*)    Protein, ur 100 (*)    Bacteria, UA RARE (*)    All other components within normal limits  AMMONIA  LACOSAMIDE  LEVETIRACETAM LEVEL     EKG Sinus tachycardia, VR 114. PR 154, QRS 94, QTc 478. No acute ST elevations or depression.  RADIOLOGY CXR: Pending CT Head: NAICA   I also independently reviewed and agree with radiologist interpretations.   PROCEDURES:  Critical Care performed: No  .1-3 Lead EKG Interpretation  Performed by: Shaune Pollack, MD Authorized by: Shaune Pollack, MD     Interpretation: non-specific     ECG rate:  100-115   ECG rate assessment: tachycardic     Rhythm: sinus tachycardia     Ectopy: none     Conduction: normal   Comments:     Indication: Seizure like activity     MEDICATIONS ORDERED IN ED: Medications  bisacodyl (DULCOLAX) suppository 10 mg  (has no administration in time range)  baclofen (LIORESAL) tablet 10 mg (has no administration in time range)  gabapentin (NEURONTIN) capsule 100 mg (100 mg Oral Not Given 02/23/23 1626)  levETIRAcetam (KEPPRA) tablet 1,500 mg (has no administration in time range)  Oral care mouth rinse (15 mLs Mouth Rinse Not Given 02/23/23 1925)  Oral care mouth rinse (has no administration in time range)  0.9 %  sodium chloride infusion (75 mL/hr Intravenous Not Given 02/23/23 1625)  enoxaparin (LOVENOX) injection 40 mg (has no administration in time range)  ondansetron (ZOFRAN) tablet 4 mg (has no administration in time range)    Or  ondansetron (ZOFRAN) injection 4 mg (has no administration in time range)  cefTRIAXone (ROCEPHIN) 1 g in sodium chloride 0.9 % 100 mL IVPB (has no administration in time range)  DULoxetine (CYMBALTA) DR capsule 40 mg (has no administration in time range)  lacosamide (VIMPAT) 200 mg in sodium chloride 0.9 % 25 mL IVPB (has no administration in time range)  valproate (DEPACON) 500 mg in dextrose 5 % 50 mL IVPB (has no administration in time range)  zonisamide (ZONEGRAN) capsule 200 mg (has no administration in time range)  0.9 %  sodium chloride infusion ( Intravenous New Bag/Given 02/23/23 1624)  sodium chloride 0.9 % bolus 1,000 mL (0 mLs Intravenous Stopped 02/23/23 1541)  levETIRAcetam (KEPPRA) IVPB 1000 mg/100 mL premix (0 mg Intravenous Stopped 02/23/23 1519)  cefTRIAXone (ROCEPHIN) 2 g in sodium chloride 0.9 % 100 mL IVPB (0 g Intravenous Stopped 02/23/23 1616)     IMPRESSION / MDM / ASSESSMENT AND PLAN / ED COURSE  I reviewed the triage vital signs and the nursing notes.                              Differential diagnosis includes, but is not limited to, breakthrough seizure in setting of occult infection, dehydraiton, medication nonadherence, AKI, other stressor  Patient's presentation is most consistent with acute presentation with potential threat to life or bodily  function.  The patient is on the cardiac monitor to evaluate for evidence of arrhythmia and/or significant heart rate changes  57 yo F with h/o CVA, seizures, here with AMS and seizures. Suspect breakthrough in setting of possible UTI, dehydration. Discussed case with '  s outpatient Neurologist. She recommends:  Increase Depakote to 500 mg BID Make sure she is on Keppra 1500 BID  Labs show subtherapeutic depakote level. CMP with mild dehydraiton. UA concerning for possible UTI. CT head negative. CXR pending. Admit to medicine.   FINAL CLINICAL IMPRESSION(S) / ED DIAGNOSES   Final diagnoses:  Seizure (HCC)  Acute cystitis without hematuria     Rx / DC Orders   ED Discharge Orders     None        Note:  This document was prepared using Dragon voice recognition software and may include unintentional dictation errors.   Shaune Pollack, MD 02/23/23 [phone]

## 2023-02-23 NOTE — ED Triage Notes (Signed)
History of seizures - Patient brought in by EMS from [facility] for seizures; Was given Ativan 1 mg IM by WOM, patient continued to have focal seizures, IV was established by EMS and an additional Versed 5 mg IV was administered; Patient is somnolent upon arrival but does respond to verbal stimuli, Pupils are equal, round and reactive

## 2023-02-23 NOTE — H&P (Addendum)
History and Physical    Katherine Fields HYQ:[id number] DOB: 07-Oct-1965 DOA: 02/23/2023  PCP: Katherine Dade, DO (Confirm with patient/family/NH records and if not entered, this has to be entered at [facility] point of entry) Patient coming from: [facility]  I have personally briefly reviewed patient's old medical records in [facility]  Chief Complaint: Seizure  HPI: Katherine [facility] is a 57 y.o. 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  Patient is postictal and sedated with multiple Ativan en route to [facility] unable to answer any questions, or history obtained by talking to ED staff and nursing home record.  Patient was found to have frequent breakthrough focal seizure in the nursing home.  It appears that on care everywhere patient has had recent outpatient neurology follow-up at [facility] last Friday, and neurology was concerned about significant elevated GAD antibody caused seizures and patient was referred to see neuro immunology clinic for possible intervention and antiseizure medication wise, neurology recommended increasing Keppra from 1000 twice daily to 1250 twice daily and continue Depakote, Vimpat and gabapentin and zonisamide same doses  ED Course: Temperature 99, borderline tachycardia nonhypoxic no hypotension, blood work creatinine 1.1, WBC 6.2.  CT head no acute findings. UA showed WBC 11-20  [facility] neurology physician was contacted by ED physician who recommended to increase Keppra to 1500 mg twice daily, Depakote 500 mg twice daily and zonisamide to 200 mg twice daily  Review of Systems: Unable to perform, patient is postictal and confused  Past Medical History:  Diagnosis Date   Chronic pain    G tube feedings (HCC)    ICH (intracerebral hemorrhage) (HCC)    Protein  calorie malnutrition (HCC)    Stiffman syndrome     No past surgical history on file.   reports that she has never smoked. She has never used smokeless tobacco. She reports that she does not drink alcohol and does not use drugs.  Allergies  Allergen Reactions   Tramadol     Other reaction(s): Unknown    Family History  Problem Relation Age of Onset   Seizures Father      Prior to Admission medications   Medication Sig Start Date End Date Taking? Authorizing Provider  acetaminophen (TYLENOL) 500 MG tablet Take 1 tablet (500 mg total) by mouth every 6 (six) hours as needed for mild pain (or Fever >/= 101). 08/04/22  Yes Leeroy Bock, MD  ammonium lactate (AMLACTIN) 12 % cream Apply 1 Application topically at bedtime. Apply to bilateral feet and legs for dry skin 01/30/23  Yes [provider]  atorvastatin (LIPITOR) 40 MG tablet Take 40 mg by mouth daily.   Yes [provider]  Baclofen 5 MG TABS Take 2 tablets by mouth 2 (two) times daily. 07/24/22  Yes [provider]  bisacodyl (DULCOLAX) 10 MG suppository Place 10 mg rectally as needed for moderate constipation.   Yes [provider]  cetirizine (ZYRTEC) 10 MG tablet Take 10 mg by mouth daily.   Yes [provider]  divalproex (DEPAKOTE SPRINKLE) 125 MG capsule Take 500 mg by mouth 2 (two) times daily. 11/06/22  Yes [provider]  DULoxetine HCl 40 MG CPEP Take 1 capsule by mouth daily. 02/19/23  Yes [provider]  esomeprazole (NEXIUM) 20 MG capsule Take 20 mg by mouth daily. 02/19/23  Yes [provider]  gabapentin (NEURONTIN) 100 MG capsule Take 100 mg by mouth 3 (three) times daily.   Yes [provider]  Glucagon, rDNA, (GLUCAGON EMERGENCY) 1 MG KIT SMARTSIG:1 Milligram(s) IM Once PRN 07/14/22  Yes [provider]  guaiFENesin (ROBITUSSIN) 100 MG/5ML liquid Take 5 mLs by mouth every 6 (six) hours as needed for cough or to loosen phlegm.    Yes [provider]  lacosamide (VIMPAT) 200 MG TABS tablet Take 200 mg by mouth 2 (two) times daily.   Yes [provider]  lactulose (CHRONULAC) 10 GM/15ML solution Take 30 mLs by mouth 2 (two) times daily.   Yes [provider]  levETIRAcetam (KEPPRA) 1000 MG tablet Take 1,000 mg by mouth 2 (two) times daily. Take with 250 mg for a total 1250 mg 02/20/23  Yes [provider]  levETIRAcetam (KEPPRA) 250 MG tablet 250 mg 2 (two) times daily. Take with 1000 mg for a total of 1250 mg 02/20/23  Yes [provider]  lisinopril (ZESTRIL) 20 MG tablet Take 20 mg by mouth daily.   Yes [provider]  LORazepam (ATIVAN) 0.5 MG tablet Take 1 tablet (0.5 mg total) by mouth 2 (two) times daily as needed for anxiety. Patient taking differently: Take 0.5 mg by mouth every 6 (six) hours as needed for anxiety. 08/04/22  Yes Leeroy Bock, MD  LORazepam (ATIVAN) 2 MG/ML injection Inject 1 mg into the muscle once. Only use if having a seizer 01/30/23  Yes [provider]  metoprolol tartrate (LOPRESSOR) 25 MG tablet Take 25 mg by mouth 2 (two) times daily.   Yes [provider]  Multiple Vitamins-Minerals (ONE-DAILY MULTI-VIT/MINERAL) TABS Take 1 tablet by mouth daily. 06/05/22  Yes [provider]  oxyCODONE (OXY IR/ROXICODONE) 5 MG immediate release tablet Take 5 mg by mouth every 6 (six) hours as needed. 12/16/22  Yes [provider]  senna (SENOKOT) 8.6 MG TABS tablet Take 2 tablets by mouth 2 (two) times daily.   Yes [provider]  sodium chloride (DEEP SEA NASAL SPRAY) 0.65 % nasal spray Place 1 spray into the nose every 4 (four) hours as needed. 08/25/22  Yes [provider]  sodium phosphate Pediatric (FLEET) 3.5-9.5 GM/59ML enema Place 1 enema rectally. Only Tuesday and Wednesday   Yes [provider]  Vitamin D, Cholecalciferol, 25 MCG (1000 UT) TABS Take 1 tablet by mouth daily. 02/05/23   Yes [provider]  zonisamide (ZONEGRAN) 100 MG capsule Take 1 capsule (100 mg total) by mouth 2 (two) times daily. Patient taking differently: Take 200 mg by mouth at bedtime. 09/30/16  Yes Delfino Lovett, MD  divalproex (DEPAKOTE SPRINKLE) 125 MG capsule Take 3 capsules (375 mg total) by mouth 2 (two) times daily. 08/31/22 09/30/22  Merwyn Katos, MD  DULoxetine (CYMBALTA) 20 MG capsule Take 20 mg by mouth 2 (two) times daily. Patient not taking: Reported on 02/23/2023 07/24/22   [provider]  levETIRAcetam (KEPPRA) 100 MG/ML solution Take 10 mLs (1,000 mg total) by mouth 2 (two) times daily. Patient not taking: Reported on 02/23/2023 09/30/16   Delfino Lovett, MD  ondansetron (ZOFRAN) 4 MG tablet Take 1 tablet (4 mg total) by mouth every 8 (eight) hours as needed for nausea or vomiting. 08/04/22   Leeroy Bock, MD  pantoprazole (PROTONIX) 40 MG tablet Take 40 mg by mouth 2 (two) times daily. Patient not taking: Reported on 02/23/2023 05/08/22   [provider]  promethazine (PHENERGAN) 25 MG/ML injection Inject 25 mg into the muscle every 4 (four) hours as needed for vomiting or nausea. Patient not taking: Reported on 02/23/2023    [provider]    Physical Exam: Vitals:   02/23/23 1222 02/23/23 1225  BP: 96/68   Pulse: (!) 111   Resp: (!) 23   Temp: 99 F (37.2 C)   TempSrc: Oral   SpO2: 97%   Weight:  68 kg  Height:  5\' 6"  (1.676 m)    Constitutional: NAD, calm, comfortable Vitals:   02/23/23 1222 02/23/23 1225  BP: 96/68   Pulse: (!) 111   Resp: (!) 23   Temp: 99 F (37.2 C)   TempSrc: Oral   SpO2: 97%   Weight:  68 kg  Height:  5\' 6"  (1.676 m)   Eyes: PERRL, lids and conjunctivae normal ENMT: Mucous membranes are moist. Posterior pharynx clear of any exudate or lesions.Normal dentition.  Neck: normal, supple, no masses, no thyromegaly Respiratory: clear to auscultation bilaterally, no wheezing, no crackles. Normal respiratory effort.  No accessory muscle use.  Cardiovascular: Regular rate and rhythm, no murmurs / rubs / gallops. No extremity edema. 2+ pedal pulses. No carotid bruits.  Abdomen: no tenderness, no masses palpated. No hepatosplenomegaly. Bowel sounds positive.  Musculoskeletal: no clubbing / cyanosis. No joint deformity upper and lower extremities. Good ROM, no contractures. Normal muscle tone.  Skin: no rashes, lesions, ulcers. No induration Neurologic: Left-sided gaze chronic? Psychiatric: eye open, responding to touch   Labs on Admission: I have personally reviewed following labs and imaging studies  CBC: Recent Labs  Lab 02/23/23 1225  WBC 6.2  HGB 11.1*  HCT 33.2*  MCV 107.1*  PLT 221   Basic Metabolic Panel: Recent Labs  Lab 02/23/23 1225  NA 137  K 3.7  CL 105  CO2 18*  GLUCOSE 104*  BUN 24*  CREATININE 1.15*  CALCIUM 9.1   GFR: Estimated Creatinine Clearance: 51.1 mL/min (A) (by C-G formula based on SCr of 1.15 mg/dL (H)). Liver Function Tests: Recent Labs  Lab 02/23/23 1225  AST 28  ALT 14  ALKPHOS 114  BILITOT 0.6  PROT 7.8  ALBUMIN 3.5   No results for input(s): "LIPASE", "AMYLASE" in the last 168 hours. Recent Labs  Lab 02/23/23 1402  AMMONIA 33   Coagulation Profile: No results for input(s): "INR", "PROTIME" in the last 168 hours. Cardiac Enzymes: No results for input(s): "CKTOTAL", "CKMB", "CKMBINDEX", "TROPONINI" in the last 168 hours. BNP (last 3 results) No results for input(s): "PROBNP" in the last 8760 hours. HbA1C: No results for input(s): "HGBA1C" in the last 72 hours. CBG: No results for input(s): "GLUCAP" in the last 168 hours. Lipid Profile: No results for input(s): "CHOL", "HDL", "LDLCALC", "TRIG", "CHOLHDL", "LDLDIRECT" in the last 72 hours. Thyroid Function Tests: No results for input(s): "TSH", "T4TOTAL", "FREET4", "T3FREE", "THYROIDAB" in the last 72 hours. Anemia Panel: No results for input(s): "VITAMINB12", "FOLATE", "FERRITIN", "TIBC",  "IRON", "RETICCTPCT" in the last 72 hours. Urine analysis:    Component Value Date/Time   COLORURINE YELLOW (A) 02/23/2023 1225   APPEARANCEUR HAZY (A) 02/23/2023 1225   APPEARANCEUR Hazy 10/31/2011 1439   LABSPEC 1.020 02/23/2023 1225   LABSPEC 1.018 10/31/2011 1439   PHURINE 7.0 02/23/2023 1225   GLUCOSEU NEGATIVE 02/23/2023 1225   GLUCOSEU Negative 10/31/2011 1439   HGBUR NEGATIVE 02/23/2023 1225   BILIRUBINUR NEGATIVE 02/23/2023 1225   BILIRUBINUR Negative 10/31/2011 1439   KETONESUR 5 (A) 02/23/2023 1225  PROTEINUR 100 (A) 02/23/2023 1225   UROBILINOGEN 0.2 11/16/2007 0926   NITRITE NEGATIVE 02/23/2023 1225   LEUKOCYTESUR NEGATIVE 02/23/2023 1225   LEUKOCYTESUR Negative 10/31/2011 1439    Radiological Exams on Admission: DG Chest Portable 1 View  Result Date: 02/23/2023 CLINICAL DATA:  Seizures EXAM: PORTABLE CHEST - 1 VIEW COMPARISON:  08/31/2022 FINDINGS: Mild left retrocardiac atelectasis/consolidation.  Right lung clear. Heart size and mediastinal contours are within normal limits. No effusion. Visualized bones unremarkable. IMPRESSION: Left retrocardiac atelectasis/consolidation. Electronically Signed   By: Corlis Leak M.D.   On: 02/23/2023 14:56    EKG: Independently reviewed.  Sinus tachycardia, poor R wave progression, no acute ST changes.  Assessment/Plan Principal Problem:   Seizure [facility]) Active Problems:   Seizures (HCC)  (please populate well all problems here in Problem List. (For example, if patient is on BP meds at home and you resume or decide to hold them, it is a problem that needs to be her. Same for CAD, COPD, HLD and so on)  Breakthrough seizure -ED physician discussed case patient's neurology in [facility], who recommended increased dosage of several of her antiseizure medication including Keppra from 1250 to 1500 mg twice daily, Depakote from 375 mg twice daily to 500 mg twice daily and zonisamide from 100 to 200 mg twice daily.  Continue Vimpat. -Will  order EEG -Seizure precaution -Aspiration precaution -NPO today and IVF -Left messages to patient daughter.  Overall prognosis is poor with frequent seizure breakthrough recently and patient is DNR, expect patient follow-up with [facility] neuro immunology for further testing and management. 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 for today  HTN -BP low today, hold off lisinopril and metoprolol -On IV fluid  DVT prophylaxis: Lovenox Code Status: DNR Family Communication: Left daughter message Disposition Plan: Expect less than 2 midnight [facility] stay Admission status: PCU observation   Emeline General MD [facility] Pager [phone]  02/23/2023, 3:25 PM

## 2023-02-23 NOTE — ED Notes (Signed)
This nurse was able to contact the facility where the patient lives.  They reported that the patient is able to take PO medication when crushed in pudding or apple sauce.  Patient was able to successfully take her medication crushed in applesauce.

## 2023-02-24 ENCOUNTER — Ambulatory Visit: Payer: Medicaid Other

## 2023-02-24 ENCOUNTER — Encounter: Payer: Self-pay | Admitting: Internal Medicine

## 2023-02-24 DIAGNOSIS — I69319 Unspecified symptoms and signs involving cognitive functions following cerebral infarction: Secondary | ICD-10-CM | POA: Diagnosis not present

## 2023-02-24 DIAGNOSIS — Z515 Encounter for palliative care: Secondary | ICD-10-CM | POA: Diagnosis not present

## 2023-02-24 DIAGNOSIS — E785 Hyperlipidemia, unspecified: Secondary | ICD-10-CM | POA: Diagnosis present

## 2023-02-24 DIAGNOSIS — G8321 Monoplegia of upper limb affecting right dominant side: Secondary | ICD-10-CM | POA: Diagnosis present

## 2023-02-24 DIAGNOSIS — G8929 Other chronic pain: Secondary | ICD-10-CM | POA: Diagnosis present

## 2023-02-24 DIAGNOSIS — R29810 Facial weakness: Secondary | ICD-10-CM | POA: Diagnosis not present

## 2023-02-24 DIAGNOSIS — R5381 Other malaise: Secondary | ICD-10-CM | POA: Diagnosis present

## 2023-02-24 DIAGNOSIS — Z7189 Other specified counseling: Secondary | ICD-10-CM | POA: Diagnosis not present

## 2023-02-24 DIAGNOSIS — N1831 Chronic kidney disease, stage 3a: Secondary | ICD-10-CM | POA: Diagnosis present

## 2023-02-24 DIAGNOSIS — N3000 Acute cystitis without hematuria: Secondary | ICD-10-CM | POA: Diagnosis present

## 2023-02-24 DIAGNOSIS — R636 Underweight: Secondary | ICD-10-CM | POA: Diagnosis present

## 2023-02-24 DIAGNOSIS — I69398 Other sequelae of cerebral infarction: Secondary | ICD-10-CM | POA: Diagnosis not present

## 2023-02-24 DIAGNOSIS — I69359 Hemiplegia and hemiparesis following cerebral infarction affecting unspecified side: Secondary | ICD-10-CM | POA: Diagnosis not present

## 2023-02-24 DIAGNOSIS — Z66 Do not resuscitate: Secondary | ICD-10-CM | POA: Diagnosis present

## 2023-02-24 DIAGNOSIS — G40019 Localization-related (focal) (partial) idiopathic epilepsy and epileptic syndromes with seizures of localized onset, intractable, without status epilepticus: Secondary | ICD-10-CM | POA: Diagnosis not present

## 2023-02-24 DIAGNOSIS — G40219 Localization-related (focal) (partial) symptomatic epilepsy and epileptic syndromes with complex partial seizures, intractable, without status epilepticus: Secondary | ICD-10-CM | POA: Diagnosis not present

## 2023-02-24 DIAGNOSIS — G2582 Stiff-man syndrome: Secondary | ICD-10-CM | POA: Diagnosis present

## 2023-02-24 DIAGNOSIS — I69391 Dysphagia following cerebral infarction: Secondary | ICD-10-CM | POA: Diagnosis not present

## 2023-02-24 DIAGNOSIS — G40111 Localization-related (focal) (partial) symptomatic epilepsy and epileptic syndromes with simple partial seizures, intractable, with status epilepticus: Secondary | ICD-10-CM | POA: Diagnosis present

## 2023-02-24 DIAGNOSIS — E538 Deficiency of other specified B group vitamins: Secondary | ICD-10-CM | POA: Diagnosis present

## 2023-02-24 DIAGNOSIS — G40919 Epilepsy, unspecified, intractable, without status epilepticus: Secondary | ICD-10-CM | POA: Diagnosis not present

## 2023-02-24 DIAGNOSIS — G928 Other toxic encephalopathy: Secondary | ICD-10-CM | POA: Diagnosis present

## 2023-02-24 DIAGNOSIS — R569 Unspecified convulsions: Secondary | ICD-10-CM | POA: Diagnosis present

## 2023-02-24 DIAGNOSIS — R532 Functional quadriplegia: Secondary | ICD-10-CM | POA: Diagnosis present

## 2023-02-24 DIAGNOSIS — I129 Hypertensive chronic kidney disease with stage 1 through stage 4 chronic kidney disease, or unspecified chronic kidney disease: Secondary | ICD-10-CM | POA: Diagnosis present

## 2023-02-24 DIAGNOSIS — R680 Hypothermia, not associated with low environmental temperature: Secondary | ICD-10-CM | POA: Diagnosis not present

## 2023-02-24 DIAGNOSIS — R1310 Dysphagia, unspecified: Secondary | ICD-10-CM | POA: Diagnosis present

## 2023-02-24 DIAGNOSIS — I2510 Atherosclerotic heart disease of native coronary artery without angina pectoris: Secondary | ICD-10-CM | POA: Diagnosis present

## 2023-02-24 DIAGNOSIS — Z79899 Other long term (current) drug therapy: Secondary | ICD-10-CM | POA: Diagnosis not present

## 2023-02-24 DIAGNOSIS — J449 Chronic obstructive pulmonary disease, unspecified: Secondary | ICD-10-CM | POA: Diagnosis present

## 2023-02-24 LAB — LEVETIRACETAM LEVEL: Levetiracetam Lvl: 91.6 ug/mL — ABNORMAL HIGH (ref 10.0–40.0)

## 2023-02-24 LAB — MRSA NEXT GEN BY PCR, NASAL: MRSA by PCR Next Gen: NOT DETECTED

## 2023-02-24 MED ORDER — LISINOPRIL 20 MG PO TABS: 20.0000 mg | ORAL_TABLET | Freq: Every day | ORAL | Status: DC

## 2023-02-24 MED ORDER — HYDRALAZINE HCL 20 MG/ML IJ SOLN
5.0000 mg | Freq: Four times a day (QID) | INTRAMUSCULAR | Status: DC | PRN
  Administered 2023-02-24: 5 mg via INTRAVENOUS
  Filled 2023-02-24: qty 1

## 2023-02-24 MED ORDER — DEXTROSE-SODIUM CHLORIDE 5-0.9 % IV SOLN: INTRAVENOUS | Status: DC

## 2023-02-24 MED ORDER — METOPROLOL TARTRATE 25 MG PO TABS: 25.0000 mg | ORAL_TABLET | Freq: Two times a day (BID) | ORAL | Status: DC

## 2023-02-24 NOTE — Evaluation (Signed)
Clinical/Bedside Swallow Evaluation Patient Details  Name: Katherine Fields MRN: [id number] Date of Birth: [birth year]  Today's Date: 02/24/2023 Time: SLP Start Time (ACUTE ONLY): 1600 SLP Stop Time (ACUTE ONLY): 1645 SLP Time Calculation (min) (ACUTE ONLY): 45 min  Past Medical History:  Past Medical History:  Diagnosis Date   Chronic pain    G tube feedings (HCC)    ICH (intracerebral hemorrhage) (HCC)    Protein calorie malnutrition (HCC)    Stiffman syndrome    Past Surgical History: History reviewed. No pertinent surgical history. HPI:  Pt is a 57 y.o. 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      Of note: recent outpatient neurology visit at [facility] last Friday 09/20, concern for elevated GAD Ab as cause for seizures, patient was referred to see neuro immunology clinic for possible intervention. Rx recs: increasing Keppra from 1000 twice daily to 1250 twice daily;continue Depakote, Vimpat gabapentin and zonisamide same doses.  09/24 post admit: seizure overnight per RN requiring IV ativan.   Head CT: No acute intracranial abnormality.  2. 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   CXR: Left retrocardiac atelectasis/consolidation.    Assessment / Plan / Recommendation  Clinical Impression   Pt seen for BSE today. Per chart notes in 08/2022, she appears declined in functioning/status re: swallowing. Pt awake, eyes opened but did not attempt to communicate w/ gestures/head nodding as she has done in the past per chart notes. Pt is Nonverbal at baseline. OF NOTE: Pt has not participated in taking any po's at a prior eval, but when Daughter arrived, pt consumed po's offered.  On RA, afebrile, WBC WNL.    Pt's presentation limited  assessment today. She did not accept nor consume po trials presented at lips, therefore, an assessment w/ dx could not by fully completed. It was determined however that pt appears to present w/ functional Cognitive decline impacting oropharyngeal phase swallowing and acceptance of po trials. Pt has a Baseline of Dysphagia per chart notes. Even though she has consumed po's (purees, thins) when fed to her w/out difficulty previously, she did not accept any po's this evaluation.   A safe oral diet cannot be recommended at this time. Pt appears to present w/ risk for aspiration/aspiration pneumonia currently.     She has challenging factors that could impact her oropharyngeal swallowing to include deconditioning/weakness, prior CVA/dysphagia and on a Pureed diet (food consistency) w/ thin liquids at NH, feeding dependency d/t Stiff Person Syndrome, and Nonverbal status w/ cognitive-language deficits impacting follow through w/ instruction/commands. These factors can increase risk for aspiration, dysphagia as well as decreased oral intake overall.    During po trials attempted, pt maintained a close mouth/lips the majority of time when boluses were placed at lips. Pt exhibited lingual sweeping and licking of lips to clear them x2/3 trials(puree, nectar liquids) but did not open mouth to accept/receive boluses from spoon. Pharyngeal swallowing was noted (post licking lips of residue).  No gross unilateral OM weakness noted at rest and when pt licked lips. Pt requires full feeding support.    Recommend NPO status currently. Frequent oral care for hygiene and stimulation of swallowing. ST services will continue to f/u next 1-2 days w/ trials to establish least restrictive, safe oral diet for pt. Recommend f/u  w/ Dietician and Palliative Care for GOC discussion and support.  MD and NSG updated, agreed.  SLP Visit Diagnosis: Dysphagia, oropharyngeal phase (R13.12) (Cognitive decline; Dysphagia baseline)     Aspiration Risk  Severe aspiration risk;Risk for inadequate nutrition/hydration    Diet Recommendation   NPO - frequent oral care   Medication Administration: Via alternative means    Other  Recommendations Recommended Consults:  (Palliative Care f/u for GOC; Dietician) Oral Care Recommendations: Oral care QID;Staff/trained caregiver to provide oral care Caregiver Recommendations:  (TBD)    Recommendations for follow up therapy are one component of a multi-disciplinary discharge planning process, led by the attending physician.  Recommendations may be updated based on patient status, additional functional criteria and insurance authorization.  Follow up Recommendations Follow physician's recommendations for discharge plan and follow up therapies      Assistance Recommended at Discharge  FULL  Functional Status Assessment Patient has had a recent decline in their functional status and/or demonstrates limited ability to make significant improvements in function in a reasonable and predictable amount of time  Frequency and Duration min 2x/week  2 weeks       Prognosis Prognosis for improved oropharyngeal function: Guarded Barriers to Reach Goals: Cognitive deficits;Language deficits;Time post onset;Severity of deficits;Behavior Barriers/Prognosis Comment: baseline comorbidities      Swallow Study   General Date of Onset: 02/23/23 HPI: Pt is a 57 y.o. 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      Of note: recent outpatient neurology visit at [facility] last Friday 09/20, concern for elevated GAD Ab as cause for seizures, patient was referred to see neuro immunology clinic for possible intervention. Rx recs: increasing Keppra from 1000 twice daily to 1250 twice daily;continue  Depakote, Vimpat gabapentin and zonisamide same doses.  09/24 post admit: seizure overnight per RN requiring IV ativan.   Head CT: No acute intracranial abnormality.  2. 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   CXR: Left retrocardiac atelectasis/consolidation. Type of Study: Bedside Swallow Evaluation Previous Swallow Assessment: 2018; 08/2022: pureed, thins Diet Prior to this Study: NPO Temperature Spikes Noted: No (wbc 6.2) Respiratory Status: Room air History of Recent Intubation: No Behavior/Cognition: Confused;Doesn't follow directions;Requires cueing (Awake; did not respond to po's except 2x) Oral Cavity Assessment: Within Functional Limits Oral Care Completed by SLP: Yes Oral Cavity - Dentition: Edentulous Vision:  (n/a) Self-Feeding Abilities: Total assist Patient Positioning: Upright in bed (needed full assistance; stiff) Baseline Vocal Quality:  (nonverbal) Volitional Cough: Cognitively unable to elicit Volitional Swallow: Unable to elicit    Oral/Motor/Sensory Function Overall Oral Motor/Sensory Function:  (CNT d/t no follow through w/ OM tasks or po's; tightly closed lips)   Ice Chips Ice chips: Not tested   Thin Liquid Thin Liquid: Not tested    Nectar Thick Nectar Thick Liquid: Impaired Presentation: Spoon (put boluses to lips>spillage; 3 attempts) Oral Phase Impairments: Poor awareness of bolus (licked lips x2/3 trials) Pharyngeal Phase Impairments:  (pharyngeal swallowing 2x post licking lips/bolus residue)   Honey Thick Honey Thick Liquid: Not tested   Puree Puree: Impaired Presentation: Spoon (put boluses to lips w/ no response 2/2 trials) Oral Phase Impairments: Poor awareness of bolus   Solid     Solid: Not tested        Jerilynn Som, MS,  CCC-SLP Speech Language Pathologist Rehab Services; [facility] [place] [phone] (ascom) [place] 02/24/2023,4:56 PM

## 2023-02-24 NOTE — Progress Notes (Addendum)
PROGRESS NOTE    Katherine Fields   QMV:[id number] DOB: Jun 24, 1965  DOA: 02/23/2023 Date of Service: 02/24/23 which is hospital day 0  PCP: Sherol Dade, DO    HPI: Katherine Fields is a 57 y.o. 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   Of note: recent outpatient neurology visit at [facility] last Friday 09/20, concern for elevated GAD Ab as cause for seizures, patient was referred to see neuro immunology clinic for possible intervention. Rx recs at that time: increasing Keppra from 1000 twice daily to 1250 twice daily;continue Depakote, Vimpat gabapentin and zonisamide same doses.    Hospital course / significant events:  09/23: received Ativan en route w/ EMS, postictal/sedated in ED. Labs unremarkable. CT head no acute findings. UA showed WBC 11-20. [facility] neurology physician was contacted by [facility] ED physician - they recommended increase Keppra to 1500 mg twice daily, Depakote 500 mg twice daily and zonisamide to 200 mg twice daily. Admitting hospitalist left messages to patient daughter.   09/24: seizure overnight per RN requiring IV ativan. This morning, daughters state she is about at her baseline. SLP eval - essentially no swallow function, switched all meds to IV for now, if not recovering may need to consider palliative consult tomorrow    Consultants:  EDP spoke w/ pt's outpatient neurologist, I discussed plan today w/ Dr Iver Nestle but no formal consult placed   Procedures/Surgeries: None       ASSESSMENT & PLAN:    Breakthrough seizure Given dysphagia, question not swallowing meds well might be a reason for breakthrough seizures?  RN reports multiple overnight seizure needing IV ativan Today EEG normal (post/inter-interictal) ED physician discussed case  patient's neurology in [facility], who recommended Medication regimen now: increased Keppra from 1250 to 1500 mg twice daily Increased Depakote from 375 mg twice daily to 500 mg twice daily  Increased zonisamide from 100 to 200 mg twice daily... however cannot take po and only other IV anti-seizure meds are phenytoin and phenobarbital Continue Vimpat 200 mg q12h I discussed today w/ Dr Iver Nestle, formal consult not placed - nothing to add to management at this time. She notes that with history chronic/breakthrough seizures, patient may experience frequent seizures as her baseline, needs follow up outpatient  Seizure precaution, Aspiration precaution NPO pend SLP eval, in meantime remain on IVF --> essentially no swallow function per SLP, will transition everything to IV meds for now, maybe will improve by tomorrow but if not would consider palliative consult  Overall prognosis is poor with frequent seizure breakthrough recently and patient is DNR, expect patient follow-up with [facility] neuro immunology for further testing and management, pending clinical course here.  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  On pured diet at baseline NPO pend SLP eval, in meantime remain on IVF --> essentially no swallow function per  SLP, will transition everything to IV meds for now, maybe will improve by tomorrow but if not would consider palliative consult    HTN BP initially low, held  home lisinopril and metoprolol d/t cannot take po, hydralazine prn   Malnutrition/underweight based on BMI: Body mass index is 24.19 kg/m.   DVT prophylaxis: lovenox  IV fluids:  Nutrition: NPO for now pend SLP recheck in AM Central lines / invasive devices: none  Code Status: DNR confirmed w/ daughter at bedside this morning  ACP documentation reviewed: 02/24/23 and none on file in [facility]  TOC needs: TBD, expect will just need coordination of transfer backto long term care when pt ready for discharge  Barriers to dispo / significant pending items: Plan to monitor 24h on increased medications, if not status epilepticus can likely d/c tomorrow              Subjective / Brief ROS:  Patient unable to contribute. Daughter at bedside states she seems about her baseline  Family Communication: spoke w/ daughter [name] who was present at bedside on rounds, she had other daughter [name] on the phone w/ her      Objective Findings:  Vitals:   02/24/23 1200 02/24/23 1223 02/24/23 1413 02/24/23 1540  BP:  (!) 159/99 (!) 165/92 (!) 163/95  Pulse: 77 80 72 87  Resp: 15 (!) 24    Temp:  98.4 F (36.9 C) 98.7 F (37.1 C) 98.3 F (36.8 C)  TempSrc:  Oral  Oral  SpO2: 100% 100% 99% 100%  Weight:      Height:        Intake/Output Summary (Last 24 hours) at 02/24/2023 1704 Last data filed at 02/24/2023 0126 Gross per 24 hour  Intake 1.33 ml  Output --  Net 1.33 ml   Filed Weights   02/23/23 1225  Weight: 68 kg    Examination:  Physical Exam Constitutional:      General: She is not in acute distress.    Appearance: She is not toxic-appearing.  Eyes:     Comments: Persistent leftward gaze bilateral eyes  Cardiovascular:     Rate and Rhythm: Normal rate and regular rhythm.  Pulmonary:     Effort: Pulmonary effort is normal.     Breath sounds: Normal breath sounds.  Abdominal:     General: Abdomen is flat. Bowel sounds are normal.     Palpations: Abdomen is soft.   Musculoskeletal:     Right lower leg: No edema.     Left lower leg: No edema.  Skin:    General: Skin is warm and dry.  Neurological:     Mental Status: Mental status is at baseline.     Motor: Weakness (R sided weakness (family states is at baseline)) present.     Comments: Following commands and grip strength in L hand is 4/5          Scheduled Medications:   enoxaparin (LOVENOX) injection  40 mg Subcutaneous Q24H   mouth rinse  15 mL Mouth Rinse Q2H   zonisamide  200 mg Oral BID    Continuous Infusions:  cefTRIAXone (ROCEPHIN)  IV 1 g (02/24/23 1616)   dextrose 5 % and 0.9 % NaCl     lacosamide (VIMPAT) IV Stopped (02/24/23 1033)   levETIRAcetam Stopped (02/24/23 1235)   valproate sodium Stopped (02/24/23 1033)    PRN Medications:  bisacodyl, hydrALAZINE, [DISCONTINUED] ondansetron **OR** ondansetron (ZOFRAN) IV, mouth rinse  Antimicrobials from admission:  Anti-infectives (From admission,  onward)    Start     Dose/Rate Route Frequency Ordered Stop   02/24/23 1400  cefTRIAXone (ROCEPHIN) 1 g in sodium chloride 0.9 % 100 mL IVPB        1 g 200 mL/hr over 30 Minutes Intravenous Every 24 hours 02/23/23 1520     02/23/23 1430  cefTRIAXone (ROCEPHIN) 2 g in sodium chloride 0.9 % 100 mL IVPB        2 g 200 mL/hr over 30 Minutes Intravenous  Once 02/23/23 1428 02/23/23 1616           Data Reviewed:  I have personally reviewed the following...  CBC: Recent Labs  Lab 02/23/23 1225  WBC 6.2  HGB 11.1*  HCT 33.2*  MCV 107.1*  PLT 221   Basic Metabolic Panel: Recent Labs  Lab 02/23/23 1225  NA 137  K 3.7  CL 105  CO2 18*  GLUCOSE 104*  BUN 24*  CREATININE 1.15*  CALCIUM 9.1   GFR: Estimated Creatinine Clearance: 51.1 mL/min (A) (by C-G formula based on SCr of 1.15 mg/dL (H)). Liver Function Tests: Recent Labs  Lab 02/23/23 1225  AST 28  ALT 14  ALKPHOS 114  BILITOT 0.6  PROT 7.8  ALBUMIN 3.5   No results for input(s): "LIPASE",  "AMYLASE" in the last 168 hours. Recent Labs  Lab 02/23/23 1402  AMMONIA 33   Coagulation Profile: No results for input(s): "INR", "PROTIME" in the last 168 hours. Cardiac Enzymes: No results for input(s): "CKTOTAL", "CKMB", "CKMBINDEX", "TROPONINI" in the last 168 hours. BNP (last 3 results) No results for input(s): "PROBNP" in the last 8760 hours. HbA1C: No results for input(s): "HGBA1C" in the last 72 hours. CBG: No results for input(s): "GLUCAP" in the last 168 hours. Lipid Profile: No results for input(s): "CHOL", "HDL", "LDLCALC", "TRIG", "CHOLHDL", "LDLDIRECT" in the last 72 hours. Thyroid Function Tests: No results for input(s): "TSH", "T4TOTAL", "FREET4", "T3FREE", "THYROIDAB" in the last 72 hours. Anemia Panel: No results for input(s): "VITAMINB12", "FOLATE", "FERRITIN", "TIBC", "IRON", "RETICCTPCT" in the last 72 hours. Most Recent Urinalysis On File:     Component Value Date/Time   COLORURINE YELLOW (A) 02/23/2023 1225   APPEARANCEUR HAZY (A) 02/23/2023 1225   APPEARANCEUR Hazy 10/31/2011 1439   LABSPEC 1.020 02/23/2023 1225   LABSPEC 1.018 10/31/2011 1439   PHURINE 7.0 02/23/2023 1225   GLUCOSEU NEGATIVE 02/23/2023 1225   GLUCOSEU Negative 10/31/2011 1439   HGBUR NEGATIVE 02/23/2023 1225   BILIRUBINUR NEGATIVE 02/23/2023 1225   BILIRUBINUR Negative 10/31/2011 1439   KETONESUR 5 (A) 02/23/2023 1225   PROTEINUR 100 (A) 02/23/2023 1225   UROBILINOGEN 0.2 11/16/2007 0926   NITRITE NEGATIVE 02/23/2023 1225   LEUKOCYTESUR NEGATIVE 02/23/2023 1225   LEUKOCYTESUR Negative 10/31/2011 1439   Sepsis Labs: @LABRCNTIP (procalcitonin:4,lacticidven:4) Microbiology: Recent Results (from the past 240 hour(s))  MRSA Next Gen by PCR, Nasal     Status: None   Collection Time: 02/24/23  2:15 PM   Specimen: Nasal Mucosa; Nasal Swab  Result Value Ref Range Status   MRSA by PCR Next Gen NOT DETECTED NOT DETECTED Final    Comment: (NOTE) The GeneXpert MRSA Assay (FDA approved  for NASAL specimens only), is one component of a comprehensive MRSA colonization surveillance program. It is not intended to diagnose MRSA infection nor to guide or monitor treatment for MRSA infections. Test performance is not FDA approved in patients less than 81 years old. Performed at [facility], [address], [place], [place] [address]  Radiology Studies last 3 days: EEG adult  Result Date: 02/24/2023 Katherine Quest, MD     02/24/2023  1:09 PM Patient Name: Shanik Kovalev MRN: [id number] Epilepsy Attending: Charlsie Fields Referring Physician/Provider: Emeline General, MD Date: 02/24/2023 Duration: 31.04 mins Patient history: 57yo F with epilepsy presented with breakthrough seizure getting eeg to evaluate for seizure Level of alertness: Awake AEDs during EEG study: LEV, LCM, VPA, GBP Technical aspects: This EEG study was done with scalp electrodes positioned according to the 10-20 International system of electrode placement. Electrical activity was reviewed with band pass filter of 1-70Hz , sensitivity of 7 uV/mm, display speed of 37mm/sec with a 60Hz  notched filter applied as appropriate. EEG data were recorded continuously and digitally stored.  Video monitoring was available and reviewed as appropriate. Description: The posterior dominant rhythm consists of 8-9 Hz activity of moderate voltage (25-35 uV) seen predominantly in posterior head regions, symmetric and reactive to eye opening and eye closing. Hyperventilation and photic stimulation were not performed.   IMPRESSION: This study is within normal limits. No seizures or epileptiform discharges were seen throughout the recording. A normal interictal EEG does not exclude the diagnosis of epilepsy. Katherine Fields   CT HEAD WO CONTRAST ( )  Result Date: 02/23/2023 CLINICAL DATA:  Mental status change EXAM: CT HEAD WITHOUT CONTRAST TECHNIQUE: Contiguous axial images were obtained from the base of the skull through the  vertex without intravenous contrast. RADIATION DOSE REDUCTION: This exam was performed according to the departmental dose-optimization program which includes automated exposure control, adjustment of the mA and/or kV according to patient size and/or use of iterative reconstruction technique. COMPARISON:  Head CT 12/26/2022.  MRI head 08/06/2027. FINDINGS: Brain: No evidence of acute infarction, hemorrhage, hydrocephalus, or extra-axial fluid collection. Again seen is a moderate-sized focal area of chronic encephalomalacia/gliosis in the left frontal lobe. There is ex vacuo dilatation of the adjacent frontal horn of the left lateral ventricle, unchanged. Vascular: No hyperdense vessel or unexpected calcification. Skull: Normal. Negative for fracture or focal lesion. Sinuses/Orbits: No acute finding. Other: None. IMPRESSION: 1. No acute intracranial abnormality. 2. Stable moderate-sized focal area of chronic encephalomalacia/gliosis in the left frontal lobe. Electronically Signed   By: Darliss Cheney M.D.   On: 02/23/2023 15:54   DG Chest Portable 1 View  Result Date: 02/23/2023 CLINICAL DATA:  Seizures EXAM: PORTABLE CHEST - 1 VIEW COMPARISON:  08/31/2022 FINDINGS: Mild left retrocardiac atelectasis/consolidation.  Right lung clear. Heart size and mediastinal contours are within normal limits. No effusion. Visualized bones unremarkable. IMPRESSION: Left retrocardiac atelectasis/consolidation. Electronically Signed   By: Corlis Leak M.D.   On: 02/23/2023 14:56         Sunnie Nielsen, DO [facility] 02/24/2023, 5:04 PM    Dictation software may have been used to generate the above note. Typos may occur and escape review in typed/dictated notes. Please contact Dr Lyn Hollingshead directly for clarity if needed.  Staff may message me via secure chat in Epic  but this may not receive an immediate response,  please page me for urgent matters!  If 7PM-7AM, please contact night  coverage [email or web address]

## 2023-02-24 NOTE — Hospital Course (Addendum)
HPI: Katherine Fields is a 57 y.o. 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   Of note: recent outpatient neurology visit at [facility] last Friday 09/20, concern for elevated GAD Ab as cause for seizures, patient was referred to see neuro immunology clinic for possible intervention. Rx recs at that time: increasing Keppra from 1000 twice daily to 1250 twice daily;continue Depakote, Vimpat gabapentin and zonisamide same doses.    Hospital course / significant events:  09/23: received Ativan en route w/ EMS, postictal/sedated in ED. Labs unremarkable. CT head no acute findings. UA showed WBC 11-20. [facility] neurology physician was contacted by [facility] ED physician - they recommended increase Keppra to 1500 mg twice daily, Depakote 500 mg twice daily and zonisamide to 200 mg twice daily. Admitting hospitalist left messages to patient daughter.   09/24: seizure overnight per RN requiring IV ativan. This morning, daughters state she is about at her baseline. SLP eval - essentially no swallow function, switched all meds to IV for now, if not recovering may need to consider palliative consult tomorrow    Consultants:  EDP spoke w/ pt's outpatient neurologist, I discussed plan today w/ Dr Iver Nestle but no formal consult placed   Procedures/Surgeries: None       ASSESSMENT & PLAN:    Breakthrough seizure Given dysphagia, question not swallowing meds well might be a reason for breakthrough seizures?  RN reports multiple overnight seizure needing IV ativan Today EEG normal (post/inter-interictal) ED physician discussed case patient's neurology in [facility], who recommended Medication regimen now: increased Keppra from 1250 to 1500 mg twice daily Increased Depakote from 375 mg twice daily to 500 mg twice  daily  Increased zonisamide from 100 to 200 mg twice daily... however cannot take po and only other IV anti-seizure meds are phenytoin and phenobarbital Continue Vimpat 200 mg q12h I discussed today w/ Dr Iver Nestle, formal consult not placed - nothing to add to management at this time. She notes that with history chronic/breakthrough seizures, patient may experience frequent seizures as her baseline, needs follow up outpatient  Seizure precaution, Aspiration precaution NPO pend SLP eval, in meantime remain on IVF --> essentially no swallow function per SLP, will transition everything to IV meds for now, maybe will improve by tomorrow but if not would consider palliative consult  Overall prognosis is poor with frequent seizure breakthrough recently and patient is DNR, expect patient follow-up with [facility] neuro immunology for further testing and management, pending clinical course here.  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  On pured diet at baseline NPO pend SLP eval, in meantime remain on IVF --> essentially no swallow function per SLP, will transition everything to IV meds for now, maybe will improve by tomorrow but if not would consider palliative consult    HTN BP initially low, held home lisinopril and  metoprolol d/t cannot take po, hydralazine prn   Malnutrition/underweight based on BMI: Body mass index is 24.19 kg/m.   DVT prophylaxis: lovenox  IV  fluids:  Nutrition: NPO for now pend SLP recheck in AM Central lines / invasive devices: none  Code Status: DNR confirmed w/ daughter at bedside this morning  ACP documentation reviewed: 02/24/23 and none on file in [facility]  TOC needs: TBD, expect will just need coordination of transfer backto long term care when pt ready for discharge  Barriers to dispo / significant pending items: Plan to monitor 24h on increased medications, if not status epilepticus can likely d/c tomorrow

## 2023-02-24 NOTE — ED Notes (Signed)
Pt removed vitals monitoring, RN reattached.  Pt brief and underpad changed, perineal care performed.  Pt repositioned.

## 2023-02-24 NOTE — ED Notes (Signed)
Pt brief changed, pt repositioned, pt offered cranberry juice and water and drank approximately 120 mL.  Pt placed back on monitor and vitals reassessed.

## 2023-02-24 NOTE — Procedures (Signed)
Patient Name: Katherine Fields  MRN: [id number]  Epilepsy Attending: Charlsie Quest  Referring Physician/Provider: Emeline General, MD Date: 02/24/2023 Duration: 31.04 mins  Patient history: 57yo F with epilepsy presented with breakthrough seizure getting eeg to evaluate for seizure  Level of alertness: Awake  AEDs during EEG study: LEV, LCM, VPA, GBP  Technical aspects: This EEG study was done with scalp electrodes positioned according to the 10-20 International system of electrode placement. Electrical activity was reviewed with band pass filter of 1-70Hz , sensitivity of 7 uV/mm, display speed of 14mm/sec with a 60Hz  notched filter applied as appropriate. EEG data were recorded continuously and digitally stored.  Video monitoring was available and reviewed as appropriate.  Description: The posterior dominant rhythm consists of 8-9 Hz activity of moderate voltage (25-35 uV) seen predominantly in posterior head regions, symmetric and reactive to eye opening and eye closing. Hyperventilation and photic stimulation were not performed.     IMPRESSION: This study is within normal limits. No seizures or epileptiform discharges were seen throughout the recording.  A normal interictal EEG does not exclude the diagnosis of epilepsy.   Katherine Fields

## 2023-02-24 NOTE — Progress Notes (Signed)
Eeg done 

## 2023-02-25 ENCOUNTER — Inpatient Hospital Stay: Payer: Medicaid Other

## 2023-02-25 DIAGNOSIS — R569 Unspecified convulsions: Secondary | ICD-10-CM | POA: Diagnosis not present

## 2023-02-25 DIAGNOSIS — G40219 Localization-related (focal) (partial) symptomatic epilepsy and epileptic syndromes with complex partial seizures, intractable, without status epilepticus: Secondary | ICD-10-CM | POA: Diagnosis not present

## 2023-02-25 DIAGNOSIS — Z7189 Other specified counseling: Secondary | ICD-10-CM

## 2023-02-25 LAB — IRON AND TIBC
Iron: 65 ug/dL (ref 28–170)
Saturation Ratios: 22 % (ref 10.4–31.8)
TIBC: 294 ug/dL (ref 250–450)
UIBC: 229 ug/dL

## 2023-02-25 LAB — PHOSPHORUS: Phosphorus: 3.7 mg/dL (ref 2.5–4.6)

## 2023-02-25 LAB — CBC
HCT: 32.2 % — ABNORMAL LOW (ref 36.0–46.0)
Hemoglobin: 11.4 g/dL — ABNORMAL LOW (ref 12.0–15.0)
MCH: 35.7 pg — ABNORMAL HIGH (ref 26.0–34.0)
MCHC: 35.4 g/dL (ref 30.0–36.0)
MCV: 100.9 fL — ABNORMAL HIGH (ref 80.0–100.0)
Platelets: 225 10*3/uL (ref 150–400)
RBC: 3.19 MIL/uL — ABNORMAL LOW (ref 3.87–5.11)
RDW: 14.3 % (ref 11.5–15.5)
WBC: 6.9 10*3/uL (ref 4.0–10.5)
nRBC: 0 % (ref 0.0–0.2)

## 2023-02-25 LAB — BASIC METABOLIC PANEL
Anion gap: 9 (ref 5–15)
BUN: 12 mg/dL (ref 6–20)
CO2: 21 mmol/L — ABNORMAL LOW (ref 22–32)
Calcium: 9 mg/dL (ref 8.9–10.3)
Chloride: 105 mmol/L (ref 98–111)
Creatinine, Ser: 0.87 mg/dL (ref 0.44–1.00)
GFR, Estimated: >60 mL/min (ref >60)
Glucose, Bld: 76 mg/dL (ref 70–99)
Potassium: 3.5 mmol/L (ref 3.5–5.1)
Sodium: 135 mmol/L (ref 135–145)

## 2023-02-25 LAB — VITAMIN B12: Vitamin B-12: 100 pg/mL — ABNORMAL LOW (ref 180–914)

## 2023-02-25 LAB — FOLATE: Folate: 10.5 ng/mL (ref >5.9)

## 2023-02-25 LAB — MAGNESIUM: Magnesium: 1.9 mg/dL (ref 1.7–2.4)

## 2023-02-25 LAB — GLUCOSE, CAPILLARY: Glucose-Capillary: 142 mg/dL — ABNORMAL HIGH (ref 70–99)

## 2023-02-25 MED ORDER — ORAL CARE MOUTH RINSE: 15.0000 mL | OROMUCOSAL | Status: DC | PRN

## 2023-02-25 MED ORDER — ACETAMINOPHEN 325 MG PO TABS: 650.0000 mg | ORAL_TABLET | Freq: Four times a day (QID) | ORAL | Status: DC | PRN

## 2023-02-25 MED ORDER — LORAZEPAM 2 MG/ML PO CONC: 2.0000 mg | ORAL | Status: DC | PRN

## 2023-02-25 MED ORDER — MORPHINE SULFATE (PF) 2 MG/ML IV SOLN
2.0000 mg | Freq: Four times a day (QID) | INTRAVENOUS | Status: DC | PRN
  Administered 2023-02-25 – 2023-02-28 (×6): 2 mg via INTRAVENOUS
  Filled 2023-02-25 (×7): qty 1

## 2023-02-25 MED ORDER — HYDRALAZINE HCL 20 MG/ML IJ SOLN
10.0000 mg | Freq: Four times a day (QID) | INTRAMUSCULAR | Status: DC | PRN
  Administered 2023-02-27 – 2023-03-03 (×3): 10 mg via INTRAVENOUS
  Filled 2023-02-25 (×3): qty 1

## 2023-02-25 MED ORDER — ORAL CARE MOUTH RINSE
15.0000 mL | OROMUCOSAL | Status: DC
  Administered 2023-02-25 – 2023-02-26 (×2): 15 mL via OROMUCOSAL

## 2023-02-25 MED ORDER — LORAZEPAM 2 MG/ML IJ SOLN
2.0000 mg | INTRAMUSCULAR | Status: AC
  Administered 2023-02-25: 2 mg via INTRAVENOUS

## 2023-02-25 MED ORDER — ZONISAMIDE 100 MG PO CAPS
100.0000 mg | ORAL_CAPSULE | Freq: Two times a day (BID) | ORAL | Status: DC
  Administered 2023-02-25 – 2023-03-04 (×13): 100 mg via ORAL
  Filled 2023-02-25 (×18): qty 1

## 2023-02-25 MED ORDER — ZONISAMIDE 100 MG PO CAPS
200.0000 mg | ORAL_CAPSULE | Freq: Every day | ORAL | Status: DC
  Administered 2023-02-26 – 2023-03-04 (×6): 200 mg via ORAL
  Filled 2023-02-25 (×9): qty 2

## 2023-02-25 MED ORDER — LORAZEPAM 2 MG/ML IJ SOLN
2.0000 mg | INTRAMUSCULAR | Status: DC | PRN
  Administered 2023-02-27 – 2023-03-05 (×4): 2 mg via INTRAVENOUS
  Filled 2023-02-25 (×4): qty 1

## 2023-02-25 MED ORDER — ADULT MULTIVITAMIN W/MINERALS CH
1.0000 | ORAL_TABLET | Freq: Every day | ORAL | Status: DC
  Administered 2023-02-25 – 2023-03-05 (×7): 1 via ORAL
  Filled 2023-02-25 (×9): qty 1

## 2023-02-25 MED ORDER — BACLOFEN 10 MG PO TABS
10.0000 mg | ORAL_TABLET | Freq: Two times a day (BID) | ORAL | Status: DC
  Administered 2023-02-25 – 2023-03-05 (×15): 10 mg via ORAL
  Filled 2023-02-25 (×17): qty 1

## 2023-02-25 MED ORDER — GABAPENTIN 100 MG PO CAPS
100.0000 mg | ORAL_CAPSULE | Freq: Three times a day (TID) | ORAL | Status: DC
  Administered 2023-02-25 – 2023-03-01 (×12): 100 mg via ORAL
  Filled 2023-02-25 (×15): qty 1

## 2023-02-25 MED ORDER — VALPROATE SODIUM 100 MG/ML IV SOLN
250.0000 mg | Freq: Four times a day (QID) | INTRAVENOUS | Status: DC
  Administered 2023-02-25 – 2023-02-26 (×3): 250 mg via INTRAVENOUS
  Filled 2023-02-25 (×4): qty 2.5

## 2023-02-25 NOTE — Progress Notes (Signed)
Patient experienced seizure beginning approximately at 0650. MD notified. Suctioned used. Patient placed on side and oxygen applied. Ativan given and 1200 dose of Keppra given early per Dr. Arville Care. Seizure lasted approximately 20 minutes.

## 2023-02-25 NOTE — Significant Event (Addendum)
Rapid Response Event Note   Reason for Call :  Seziure  Initial Focused Assessment:  Patient with seziure activity jerking arms up and groaning in bed. Staff reported seizure started a 0650 when staff noticed HR went up to the 170s on tele. BP 208/133 HR 141 O2 100% patient placed on NRB from RA. Ativan given and Dr Arville Care at bedside. Morning dose of Keppra given early per MD.  Patients Seziure stopped 0716 BP 135/75 HR 125 RR20 O2 100% on room air. Dr Lucianne Muss contacted [phone]  and verbal order for Ativan PRN to be available if she started to seize again. Patient mentation back to baseline patient no longer jerking and able to make eye contact with staff.   Interventions:  -Keppra -Ativan -CXR   MD Notified:Dr [facility] Call [phone] Arrival Time:0704 End GEXB:2841  Judyann Munson, RN

## 2023-02-25 NOTE — Progress Notes (Signed)
Initial Nutrition Assessment  DOCUMENTATION CODES:   Not applicable  INTERVENTION:   -Magic cup TID with meals, each supplement provides 290 kcal and 9 grams of protein  -MVI with minerals daily -Feeding assistance with meals  NUTRITION DIAGNOSIS:   Inadequate oral intake related to dysphagia as evidenced by per patient/family report.  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  Pt admitted with breakthrough seizures.   9/24- s/p BSE- NPO 9/25- s/p BSE- advanced to dysphagia 1 diet with nectar thickened liquids  Reviewed I/O's: +669 ml x 24 hours  UOP: 650 ml x 24 hours  Case discussed with RN and SLP. Pt's mentation is at baseline. Pt passed swallow eval, but eating very little, Palliative care following; pt is a DNR/ DNI. Pt does not want a feeding tube. Noted meal completions 10%.   Pt is a resident of [facility].   Pt lying in bed at time of visit. No family at bedside. Pt turned head towards RD when name was called and nodded head to simple close ended questions.   Reviewed wt hx; wt has been stable over the past 6 months.   Medications reviewed and include keppra and dextrose 5%-0.9% sodium chloride infusion @ 75 ml/hr.   Labs reviewed: CCBGS: 142 (inpatient orders for glycemic control are none).    NUTRITION - FOCUSED PHYSICAL EXAM:  Flowsheet Row Most Recent Value  Orbital Region Mild depletion  Upper Arm Region No depletion  Thoracic and Lumbar Region No depletion  Buccal Region No depletion  Temple Region Moderate depletion  Clavicle  Bone Region No depletion  Clavicle and Acromion Bone Region No depletion  Scapular Bone Region No depletion  Dorsal Hand Mild depletion  Patellar Region Moderate depletion  Anterior Thigh Region Moderate depletion  Posterior Calf Region Moderate depletion  Edema (RD Assessment) None  Hair Reviewed  Eyes Reviewed  Mouth Reviewed  Skin Reviewed  Nails Reviewed       Diet Order:   Diet Order             DIET - DYS 1 Room service appropriate? Yes with Assist; Fluid consistency: Nectar Thick  Diet effective now                   EDUCATION NEEDS:   No education needs have been identified at this time  Skin:  Skin Assessment: Reviewed RN Assessment  Last BM:  Unknown  Height:   Ht Readings from Last 1 Encounters:  02/23/23 5\' 6"  (1.676 m)    Weight:   Wt Readings from Last 1 Encounters:  02/23/23 68 kg    Ideal Body Weight:  59.1 kg  BMI:  Body mass index is 24.19 kg/m.  Estimated Nutritional Needs:   Kcal:  1550-1750  Protein:  85-100 grams  Fluid:  > 1.5 L    Levada Schilling, RD, LDN, CDCES Registered Dietitian II Certified Diabetes Care and Education Specialist Please refer to [facility] for RD and/or RD on-call/weekend/after hours pager

## 2023-02-25 NOTE — Progress Notes (Signed)
The patient was seen and examined for breakthrough seizure.  She was given 2 mg of IV Ativan and her IV Keppra was given earlier this morning.  Portable chest x-ray was ordered.  Dr. Lucianne Muss was updated about the case and will follow.

## 2023-02-25 NOTE — Consult Note (Addendum)
Neurology Consultation Reason for Consult: Frequent seizures Requesting Physician: Gillis Santa  CC: Focal seizures  History is obtained from: Daughters and chart review  HPI: Katherine Fields is a 57 y.o. 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  She was subsequently admitted. Per SLP notes from yesterday (02/24/2023) "OF NOTE: Pt has not participated in taking any po's at a prior eval, but when Daughter arrived, pt consumed po's offered."  As she did not accept p.o. from SLP during the evaluation a safe oral diet could not be recommended by SLP and therefore n.p.o. status was recommended with frequent oral care and she did not receive her evening dose of zonisamide.  Additionally her baclofen was discontinued and her gabapentin has not been given this admission.  She has been receiving ceftriaxone for UTI versus asymptomatic bacteriuria  She was last seen by her outpatient neurologist on 02/19/2023 at which time there was referral to [facility] neuro immunology due to her elevated anti-GAD-65 antibody levels, Keppra was increased to 1250 twice daily, and it was noted that there is room to optimize Depakote and gabapentin with consideration of VNS placement long-term pending goals of care discussions, and there are concerns for epileptic as well as nonepileptic events Antiseizure medication regimen at time of presentation -Keppra 1250 twice daily, increased to 1500 mg twice daily -Depakote 375 mg twice daily, increased to total daily dose of 1000 mg (level on admit slightly low)  -Gabapentin 100 mg 3 times daily  (mainly for pain but can also be for seizure control), not continued on admission -Vimpat 200 mg twice daily, continued  -Zonisamide 200 mg in the evening and 100 mg in the morning (missed yesterday evening dose due to NPO status  Current seizure frequency ~1x week  Current seizure description -- shaking of either just the right arm, at times both arms, with gasping sounds.   Of note, she has a G-tube in the past for this was very problematic as she would frequently self remove it.  Family notes she never has trouble taking her medications at her facility, her favorite method to receive medications is vanilla pudding.  However there are times that she will not eat a meal.  She does not want to eat she will not eat  Seizure HPI from outpatient neurology clinical notes  Handedness: Right  Onset: 1989 or 1580 (~ 57 years of age)  Before the stroke that occurred in end of 2000.   Number of seizure types: Two  Seizure/seizure-like event description#1: "staring"  - Aura: gets quite and stares  - Semiology: stares, and eyes fixed, then face twitches,not sure which side, may last about 5 minutes, confused post ictal for about 1 hour.  Frequency: Under control then Feb/March, 2024, was noted to have some seizure activity about 1 per month per daughter but the group home says that she has these are about every weekend, the last seizure having occurred last month possibly  Seizure/seizure-like event description #2: "shaking" starts as "staring" and progresses  - Aura: gets quite and stares  - Semiology: stares, and then right face twitches, then spreads over the whole body about 5 minutes and then had  urine incontinent and in the past used to bite her tongue but now she does not (no teeth).  Frequency: once every month last full convulsion was 02/07/2023  A note shows , "hot flash, legs locking up, body stiffening < 5 minutes wit followed by confusion ([facility] note 02/10/2026- Dr.  Sherlean Foot)  Seizure/seizure-like event description: "jerking "  - Aura: unclear  - Semiology: while in the day room, may have jerking and vocalization sounds so unclear what this is and was told that this was not seizure like events. She is able to talk and understand and lasts about 1 hour (this per informant not thought to be a seizure),  Frequency: Very rare last seen in spurts, this occurred 2 weeks ago.   Provoking Features: missing seizure medications or infection. Possibly stool impaction seems to occur at the time of the seizure.   Seizure risk factors: She was the product of an uncomplicated pregnancy and delivery. The patient had a normal development.There is no history febrile seizure as an infant or child, meningitis, encephalitis, known brain structural abnormality,or significant head trauma. There is a family history of seizures in her father.   Work up done  Video EEG (8/25-02/03/16, intermittent)): initially diffuse slowing, R LPD the R C sharp waves, no seizures  EEG 9/10-9/11, 2005: Showed right temporal spikes.   Brain MRI 09/22/11: Left frontal intraparenchymal hemorrhage with unchanged midline shift. No underlying enhancing lesion or vascular malformation identified. Small amount of left frontoparietal subarachnoid hemorrhage Brain MRI: 07/26/2021: -Left frontal lobe encephalomalacia, similar to prior and consistent with history of remote intraparenchymal hemorrhage. - Global super and infratentorial atrophy.   ASMS: Prior: Lamictal, Dilantin, Tegretol, Keppra, Vimpat,  Current:  Zonisamide 100 mg BID  Vimpat 200 mg BID (helped most)  Keppra 1000 mg BID (helped most)   Admission for status epilepticus: Yes  Admission for frequent seizures: Yes    ROS: Unable to assess secondary to patient's mental status (baseline nonverbal)  Past Medical History:  Diagnosis Date   Chronic pain    G tube feedings (HCC)    ICH (intracerebral hemorrhage) (HCC)    Protein calorie  malnutrition (HCC)    Stiffman syndrome    Family History  Problem Relation Age of Onset   Seizures Father    Social History:  reports that she has never smoked. She has never used smokeless tobacco. She reports that she does not drink alcohol and does not use drugs.   Exam: Current vital signs: BP (!) 136/45 (BP Location: Right Arm)   Pulse (!) 51   Temp 97.6 F (36.4 C)   Resp 20   Ht 5\' 6"  (1.676 m)   Wt 68 kg   LMP  (LMP Unknown)   SpO2 100%   BMI 24.19 kg/m  Vital signs in last 24 hours: Temp:  [97.6 F (36.4 C)-98.9 F (37.2 C)] 97.6 F (36.4 C) (09/25 0917) Pulse Rate:  [51-125] 51 (09/25 0917) Resp:  [15-24] 20 (09/25 0716) BP: (134-209)/(45-133) 136/45 (09/25 0917) SpO2:  [99 %-100 %] 100 % (09/25 0917)   Physical Exam  Constitutional: Appears chronically ill Psych: Flat affect Eyes: No scleral injection HENT: No oropharyngeal obstruction.  MSK: Diffuse muscle wasting, Cardiovascular: Perfusing extremities well Respiratory: Effort normal, non-labored breathing GI: Soft.  No distension.  Skin: Warm dry and intact visible skin  Neuro: Mental Status: Patient is awake, alert, answers yes/no questions with approximately 75% accuracy (nodding and shaking her head).  Follows some simple commands.  Delayed latency  of responses.  Nonverbal Cranial Nerves: II: Pupils are equal, round, and reactive to light.  Appears to blink to threat bilaterally III,IV, VI: Spontaneous eye movements bilaterally.  Does not clearly track examiner V: Facial sensation is symmetric to light eyelash brush VII: Facial movement is symmetric at rest VIII: hearing is intact to voice Motor: Purposeful with the left upper extremity and at least antigravity.  Wiggles bilateral toes to command, more on the left than the right.  Slight movement in the right upper extremity, 1/5 Sensory: Reacts to sensation in all 4 extremities grossly equally   I have reviewed labs in epic and the results  pertinent to this consultation are:  Basic Metabolic Panel: Recent Labs  Lab 02/23/23 1225 02/25/23 0857  NA 137 135  K 3.7 3.5  CL 105 105  CO2 18* 21*  GLUCOSE 104* 76  BUN 24* 12  CREATININE 1.15* 0.87  CALCIUM 9.1 9.0  MG  --  1.9  PHOS  --  3.7    CBC: Recent Labs  Lab 02/23/23 1225 02/25/23 0857  WBC 6.2 6.9  HGB 11.1* 11.4*  HCT 33.2* 32.2*  MCV 107.1* 100.9*  PLT 221 225    Coagulation Studies: No results for input(s): "LABPROT", "INR" in the last 72 hours.   Lab Results  Component Value Date   VALPROATE 39 (L) 02/23/2023   I have reviewed the images obtained:  Head CT personally reviewed, agree with radiology:   1. No acute intracranial abnormality. 2. Stable moderate-sized focal area of chronic encephalomalacia/gliosis in the left frontal lobe.  Impression: Breakthrough seizures in a patient with intractable epilepsy.  Pending outpatient neurological consultation for consideration of immunotherapy given history of GAD65 antibody.  At this time she seems to be at her baseline and I am most concerned that her breakthrough seizures are in the setting of missed medications while hospitalized.  At this time her clinical trajectory does not seem to merit emergent inpatient immunosuppression treatment such as pulse dose steroids -- however if she has continued seizures despite increasing her medications and without missed doses of her medications this could be considered  Appreciate SLP concern that the patient cannot reliably demonstrate safe swallow at this time.  However per history provided by family at baseline sometimes patient will not accept p.o. trials.  Given the risks of aspiration and death secondary to seizure, I feel that the risk of possible aspiration with administration of antiseizure medications is outweighed by the benefit of administration of these medications regardless of ability to complete a full SLP evaluation.  Family is in  agreement.  Please continue antiseizure medications IV as able.  Please give p.o. antiseizure medications (zonisamide most importantly, gabapentin and baclofen (due to risk of seizures with baclofen withdrawal) less importantly) with small amounts of vanilla pudding, even if patient is n.p.o. otherwise. If patient refuses oral seizure medications administration should be re-attempted every 15-60 minutes until she takes the medications.   Further discussion of goals of care and follow-up of SLP evaluations per primary team  Recommendations: -Resume baclofen at a dose of at least 5 mg twice daily if not 10 mg twice daily (home dose) to avoid withdrawal seizures (I have ordered 10 mg twice daily). -Continue Vimpat 200 mg twice daily -Agree with Depakote total daily dose of 1 g but given pharmacokinetics of IV dosing would change to 250 every 6 hours IV (ordered) -Continue Keppra 1500 twice daily -Zonisamide order has been updated to her current home dose  of 100 in the morning and 300 at night (100 mg twice daily with additional 200 mg capsule nightly) -Resume gabapentin 100 mg 3 times daily -Given expense of intranasal midazolam, I have ordered Ativan solution to be given into the cheek, 2 mg up to 2 doses 5 minutes apart for seizure activity lasting greater than 5 minutes, this should be included in the discharge medications -Medical clearance per primary team, inpatient neurology will sign off at this time but do not hesitate to reach out if additional questions or concerns arise.  Discussed with primary team, SLP and nursing via secure chat  Thank you for involving [place] in the care of this patient  Brooke Dare MD-PhD [facility] [phone] [facility] coverage for [facility] is from 8 AM to 4 AM in-house and 4 PM to 8 PM by telephone/video. 8 PM to 8 AM emergent questions or overnight urgent questions should be addressed to Teleneurology On-call or [facility]  neurohospitalist; contact information can be found on AMION

## 2023-02-25 NOTE — Plan of Care (Signed)
  Problem: Clinical Measurements: Goal: Ability to maintain clinical measurements within normal limits will improve Outcome: Progressing Goal: Will remain free from infection Outcome: Progressing Goal: Diagnostic test results will improve Outcome: Progressing Goal: Respiratory complications will improve Outcome: Progressing Goal: Cardiovascular complication will be avoided Outcome: Progressing   Problem: Activity: Goal: Risk for activity intolerance will decrease Outcome: Progressing   Problem: Nutrition: Goal: Adequate nutrition will be maintained Outcome: Progressing   Problem: Coping: Goal: Level of anxiety will decrease Outcome: Progressing   Problem: Elimination: Goal: Will not experience complications related to bowel motility Outcome: Progressing Goal: Will not experience complications related to urinary retention Outcome: Progressing   Problem: Pain Managment: Goal: General experience of comfort will improve Outcome: Progressing   Problem: Safety: Goal: Ability to remain free from injury will improve Outcome: Progressing   Problem: Skin Integrity: Goal: Risk for impaired skin integrity will decrease Outcome: Progressing   Problem: Education: Goal: Knowledge of General Education information will improve Description: Including pain rating scale, medication(s)/side effects and non-pharmacologic comfort measures Outcome: Not Progressing Note: Patient has impaired cognition. Will continue to educate as needed.   Problem: Health Behavior/Discharge Planning: Goal: Ability to manage health-related needs will improve Outcome: Not Progressing Note: Patient has impaired cognition and has quadriplegia. Will continue to educate as needed.

## 2023-02-25 NOTE — Progress Notes (Signed)
Responded to rapid-no family present, no needs at this time-medical team actively in room caring for pt.

## 2023-02-25 NOTE — Progress Notes (Signed)
Speech Language Pathology Treatment: Dysphagia  Patient Details Name: Katherine Fields MRN: [id number] DOB: [birth year] Today's Date: 02/25/2023 Time: 2951-8841 SLP Time Calculation (min) (ACUTE ONLY): 60 min  Assessment / Plan / Recommendation Clinical Impression  Pt seen for ongoing assessment of swallowing today. Per chart notes in 08/2022, she appears declined in functioning/status re: swallowing. Pt awake, eyes opened but did not attempt to communicate w/ gestures/head nodding except 1x; she has engaged much more in the past per chart notes. Pt is Nonverbal at baseline.   On [place] O2 3L, afebrile, WBC WNL. Pt had a seizure event this morning per chart/NSG prior to shift change. Pt is awake w/ eyes open, similar presentation as at evaluation yesterday but more engaged actually.    W/ po trials presented, pt appears to present w/ (chronic) oropharyngeal phase dysphagia w/ Cognitive dysfunction(s/p CVA) impacting oropharyngeal phase swallowing and acceptance of po trials at times. Pt has a Baseline of Dysphagia per chart notes.  Pt has an increased risk for aspiration/aspiration pneumonia. This can be reduce somewhat following aspiration precautions and using a modified diet consistency of Dysphagia level 1 w/ Nectar liquids.     She has challenging factors that could impact her oropharyngeal swallowing to include deconditioning/weakness, prior CVA/dysphagia and on a Pureed diet (food consistency) w/ thin liquids at NH, feeding dependency d/t Stiff Person Syndrome, and Nonverbal status w/ cognitive-language deficits s/p CVA impacting follow through w/ instruction/commands. These factors can increase risk for aspiration, dysphagia as well as decreased oral intake overall.    During po trials attempted, pt initially did not open mouth to accept po trials. W/ continued encouragement, she responded to boluses placed at lips by opening mouth slightly. Small tsp bolus of Nectar liquids, then purees, were  placed anteriorly in mouth. Oral phase deficits c/b: anterior leakage, increased mashing/gumming and oral phase time, delayed A-P transfer of boluses. Suspected delayed pharyngeal swallow initiation. Reflexive pharyngeal swallowing followed; adequate laryngeal excursion during swallow. Audible swallows noted. Much TIME was required to complete the oropharyngeal phases of swallowing; cues. TSP feeding was SLOW.  O2 sats remained 99-100% during session when checked. No overt coughing noted.  Pt requires full feeding support.    Pt does have increased risk for aspiration w/ oral intake at Baseline. Recommend initiation of a dysphagia level 1 (puree) diet w/ Nectar liquids via TSP w/ strict aspiration precautions and full feeding support -- NSG to stop feeding pt if increased s/s of aspiration noted w/ oral intake and inform MD. Frequent oral care for hygiene and stimulation of swallowing, and b/f po's. Pills CRUSHED in Puree.  Would not recommend feeding pt po's if she is not wanting to take them (or if not engaged, alert) as this can increase risk for choking.  ST services will continue to f/u next 1-2 days w/ ongoing toleration of diet; education w/ Team, Family.  Recommend f/u w/ Dietician and Palliative Care for GOC moving forward discussion and support.  MD and NSG updated, agreed. Discussed w/ Palliative Care NP.     HPI HPI: Pt is a 57 y.o. 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      Of note: recent outpatient neurology visit at [facility] last Friday 09/20, concern for elevated GAD Ab as cause for seizures, patient was referred to see neuro immunology clinic for  possible intervention. Rx recs: increasing Keppra from 1000 twice daily to 1250 twice daily;continue Depakote, Vimpat gabapentin  and zonisamide same doses.  09/24 post admit: seizure overnight per RN requiring IV ativan.   Head CT: No acute intracranial abnormality.  2. 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   CXR: Left retrocardiac atelectasis/consolidation.      SLP Plan  Continue with current plan of care      Recommendations for follow up therapy are one component of a multi-disciplinary discharge planning process, led by the attending physician.  Recommendations may be updated based on patient status, additional functional criteria and insurance authorization.    Recommendations  Diet recommendations: Dysphagia 1 (puree);Nectar-thick liquid Liquids provided via: Teaspoon Medication Administration: Crushed with puree Supervision: Staff to assist with self feeding;Full supervision/cueing for compensatory strategies Compensations: Minimize environmental distractions;Slow rate;Small sips/bites;Multiple dry swallows after each bite/sip;Follow solids with liquid Postural Changes and/or Swallow Maneuvers: Out of bed for meals;Seated upright 90 degrees;Upright 30-60 min after meal                 (Palliative Care/Hospice f/u) Oral care BID;Oral care before and after PO;Staff/trained caregiver to provide oral care   Frequent or constant Supervision/Assistance Dysphagia, oropharyngeal phase (R13.12) (Cognitive decline; chronic Dysphagia baseline)     Continue with current plan of care       Jerilynn Som, MS, CCC-SLP Speech Language Pathologist Rehab Services; [facility] [place] [phone] (ascom) Reiss Mowrey  02/25/2023, 2:35 PM

## 2023-02-25 NOTE — Progress Notes (Signed)
[facility] Progress Note  Patient: Katherine Fields    ZOX:[id number]  DOA: 02/23/2023     Date of Service: the patient was seen and examined on 02/25/2023  Chief Complaint  Patient presents with   Seizures    History of seizures - Patient brought in by EMS from [facility] for seizures; Was given Ativan 1 mg IM by WOM, patient continued to have focal seizures, IV was established by EMS and an additional Versed 5 mg IV was administered; Patient is somnolent upon arrival but does respond to verbal stimuli, Pupils are equal, round and reactive   Brief hospital course: Katherine Fields is a 57 y.o. 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    Of note: recent outpatient neurology visit at [facility] last Friday 09/20, concern for elevated GAD Ab as cause for seizures, patient was referred to see neuro immunology clinic for possible intervention. Rx recs at that time: increasing Keppra from 1000 twice daily to 1250 twice daily;continue Depakote, Vimpat gabapentin and zonisamide same doses.      Hospital course / significant events:  09/23: received Ativan en route w/ EMS, postictal/sedated in ED. Labs unremarkable. CT head no acute findings. UA showed WBC 11-20. [facility] neurology physician was contacted by [facility] ED physician - they recommended increase Keppra to 1500 mg twice daily, Depakote 500 mg twice daily and zonisamide to 200 mg twice daily. Admitting hospitalist left messages to patient daughter.   09/24: seizure overnight per RN requiring IV ativan. This morning, daughters state she is about at her baseline. SLP eval - essentially no swallow function, switched all meds to IV for now, if not recovering may need to consider palliative consult tomorrow      Consultants:  EDP spoke w/  pt's outpatient neurologist, I discussed plan today w/ Dr Iver Nestle but no formal consult placed    Procedures/Surgeries: None    Assessment and Plan: Breakthrough seizure Given dysphagia, question not swallowing meds well might be a reason for breakthrough seizures?  RN reports multiple overnight seizure needing IV ativan Today EEG normal (post/inter-interictal) ED physician discussed case patient's neurology in [facility], who recommended Medication regimen now: 9/24 increased Keppra from 1250 to 1500 mg twice daily 9/25 started Depacon 250 mg IV every 6 hourly  Continue Vimpat 200 mg q12h 9/25 zonisamide 100 mg p.o. twice daily and 200 mg nightly from 9/26 Gabapentin 100 mg p.o. 3 times daily Baclofen 10 mg p.o. twice daily IV Ativan as needed for seizures Neurology consult appreciated Continue seizure precaution, Aspiration precaution SLP eval done and started pured diet Continue IV fluid for gentle hydration Palliative care consulted for goals of care discussion with family  Overall prognosis is poor with frequent seizure breakthrough recently and patient is DNR, expect patient follow-up with [facility] neuro immunology for further testing and management, pending clinical course here.  Switched what could be switched to IV, only other IV anti-seizure meds is phenobarbital, given we've increased several meds will hopefully take zonisamide po as well     UTI vs asymptomatic bacteriuria - unable to assess symptoms, given cognition cannot obtain hx  Ceftriaxone IV daily for 5 days Urine culture was never obtained   Hx CVA with residual left-sided gaze and functional quadriplegia, dysphagia No acute concern CT head reassuring   Chronic dysphagia - worse now  question postictal or med effects vs new baseline, question not swallowing meds well as reason for breakthrough seizures?  On pured diet at baseline, seen by SLP, started on pured diet again on 9/25    HTN BP initially low, held home  lisinopril and metoprolol d/t cannot take po, hydralazine prn   Body mass index is 24.19 kg/m.  Nutrition Problem: Inadequate oral intake Etiology: dysphagia Interventions: Interventions: Magic cup, MVI   Diet: Pured DVT Prophylaxis: Subcutaneous Lovenox   Advance goals of care discussion: DNR/DNI and NO feeding Tube  Family Communication: family was not present at bedside, at the time of interview.  The pt provided permission to discuss medical plan with the family. Opportunity was given to ask question and all questions were answered satisfactorily.   Disposition:  Pt is LTC resident from [facility]. , admitted with intractable seizures, still has seizure, which precludes a safe discharge. Discharge to same facility, when cleared by Neurologist.  Subjective: No significant events overnight but patient had intractable seizures in the morning which were controlled with Ativan IV and early dose of Keppra. Nonverbal at baseline, grimacing and moaning possible secondary to pain due to intractable seizures happened in the morning.  Physical Exam: General: Patient was moaning and in mild distress could be secondary to pain due to seizures Eyes: PERRLA ENT: Oral Mucosa Clear, dry Neck: no JVD,  Cardiovascular: S1 and S2 Present, no Murmur,  Respiratory: good respiratory effort, Bilateral Air entry equal and Decreased, no Crackles, no wheezes Abdomen: Bowel Sound present, Soft and no tenderness,  Skin: no rashes Extremities: no Pedal edema, no calf tenderness Neurologic: Stiffness of all extremities due to stiff man syndrome Nonambulatory at baseline  Vitals:   02/25/23 0917 02/25/23 1214 02/25/23 1214 02/25/23 1634  BP: (!) 136/45 (!) 150/97 (!) 150/97 (!) 146/79  Pulse: (!) 51 98 99 95  Resp:      Temp: 97.6 F (36.4 C) 98.5 F (36.9 C) 98.5 F (36.9 C) 99 F (37.2 C)  TempSrc:      SpO2: 100% 100% 100% 100%  Weight:      Height:        Intake/Output Summary  (Last 24 hours) at 02/25/2023 1636 Last data filed at 02/25/2023 1400 Gross per 24 hour  Intake 1338.48 ml  Output 650 ml  Net 688.48 ml   Filed Weights   02/23/23 1225  Weight: 68 kg    Data Reviewed: I have personally reviewed and interpreted daily labs, tele strips, imagings as discussed above. I reviewed all nursing notes, pharmacy notes, vitals, pertinent old records I have discussed plan of care as described above with RN and patient/family.  CBC: Recent Labs  Lab 02/23/23 1225 02/25/23 0857  WBC 6.2 6.9  HGB 11.1* 11.4*  HCT 33.2* 32.2*  MCV 107.1* 100.9*  PLT 221 225   Basic Metabolic Panel: Recent Labs  Lab 02/23/23 1225 02/25/23 0857  NA 137 135  K 3.7 3.5  CL 105 105  CO2 18* 21*  GLUCOSE 104* 76  BUN 24* 12  CREATININE 1.15* 0.87  CALCIUM 9.1 9.0  MG  --  1.9  PHOS  --  3.7    Studies: DG Chest Port 1 View  Result Date: 02/25/2023 CLINICAL DATA:  Seizure EXAM: PORTABLE CHEST 1 VIEW COMPARISON:  02/23/2023 FINDINGS: Normal heart size and mediastinal contours. No acute infiltrate or edema. No effusion or pneumothorax. No acute osseous findings. IMPRESSION: No active disease. Electronically Signed   By: Christiane Ha  Watts M.D.   On: 02/25/2023 08:00    Scheduled Meds:  baclofen  10 mg Oral BID   enoxaparin (LOVENOX) injection  40 mg Subcutaneous Q24H   gabapentin  100 mg Oral TID   multivitamin with minerals  1 tablet Oral Daily   mouth rinse  15 mL Mouth Rinse Q2H   mouth rinse  15 mL Mouth Rinse 4 times per day   zonisamide  100 mg Oral BID   [START ON 02/26/2023] zonisamide  200 mg Oral QHS   Continuous Infusions:  cefTRIAXone (ROCEPHIN)  IV 1 g (02/25/23 1233)   dextrose 5 % and 0.9 % NaCl 75 mL/hr at 02/25/23 0901   lacosamide (VIMPAT) IV 200 mg (02/25/23 1049)   levETIRAcetam 1,500 mg (02/25/23 0706)   valproate sodium     PRN Meds: acetaminophen, bisacodyl, hydrALAZINE, LORazepam, LORazepam, morphine injection, [DISCONTINUED] ondansetron  **OR** ondansetron (ZOFRAN) IV, mouth rinse, mouth rinse  Time spent: 35 minutes  Author: Gillis Santa. MD [facility] 02/25/2023 4:36 PM  To reach On-call, see care teams to locate the attending and reach out to them via [email or web address]. If 7PM-7AM, please contact night-coverage If you still have difficulty reaching the attending provider, please page the [facility] (Director on Call) for [facility] on amion for assistance.

## 2023-02-25 NOTE — Consult Note (Signed)
Consultation Note Date: 02/25/2023   Patient Name: Katherine Fields  DOB: Apr 28, 1966  MRN: [id number]  Age / Sex: 57 y.o., female  PCP: Sherol Dade, DO Referring Physician: Gillis Santa, MD  Reason for Consultation: Establishing goals of care  HPI/Patient Profile: Per EMR notes, Katherine Fields is a 57 y.o. 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 She presented on 02/23/2023 due to seizures and was admitted.   Clinical Assessment and Goals of Care: Notes and labs reviewed, including notes from care everywhere.  In to see patient.  SLP is at bedside, and patient is working with them.  She is nonverbal.  Called to speak with patient's daughter [name].  Ronta three-way called sister [name] to join in conversation.  They discussed that at baseline patient needs to be fed.  They state she has been sleeping a lot.  They states she is nonambulatory.   We discussed her diagnoses, GOC, EOL wishes and options.  A detailed discussion was had today regarding advanced directives. The difference between an aggressive medical intervention path and a comfort care path was discussed.  Values and goals of care important to patient and family were attempted to be elicited.  Discussed limitations of medical interventions to prolong quality of life in some situations and discussed the concept of human mortality.    Daughters state patient is able to communicate her needs as she is able to say words, though sometimes the words are less clear than others.  They advise that at baseline their mother is on a pured diet with thin liquids.  They state recommendations for thickened liquids have been previously made, but patient does not like  thickened liquids and will not drink them.  The daughters advise that they are aware of the risk for aspiration. They advise that the patient herself stated that she did not want to thicken liquids, and the family wants to preserve her wishes and quality of life.  They confirm DNR and DNI status.  They advise they would not want a feeding tube for patient as she has previously had a feeding tube and would not want this in the future.  SUMMARY OF RECOMMENDATIONS   PMT will follow.  Will need time for outcomes.  DNR/DNI.  No feeding tube.        Primary Diagnoses: Present on Admission: **None**   I have reviewed the medical record, interviewed the patient and family, and examined the patient. The following aspects are pertinent.  Past Medical History:  Diagnosis Date   Chronic pain    G tube feedings (HCC)    ICH (intracerebral hemorrhage) (HCC)    Protein calorie malnutrition (HCC)    Stiffman syndrome    Social History   Socioeconomic History   Marital status: Single    Spouse name: Not on file   Number of children: Not on file   Years of  education: Not on file   Highest education level: Not on file  Occupational History   Not on file  Tobacco Use   Smoking status: Never   Smokeless tobacco: Never  Vaping Use   Vaping status: Never Used  Substance and Sexual Activity   Alcohol use: No   Drug use: No   Sexual activity: Not on file  Other Topics Concern   Not on file  Social History Narrative   Not on file   Social Determinants of Health   Financial Resource Strain: Low Risk  (07/28/2021)   Received from [facility]   Overall Financial Resource Strain (CARDIA)    Difficulty of Paying Living Expenses: Not hard at all  Food Insecurity: Patient Unable To Answer (02/24/2023)   Hunger Vital Sign    Worried About Running Out of Food in the Last Year: Patient unable to answer    Ran Out of Food in the Last Year: Patient unable to answer   Transportation Needs: Patient Unable To Answer (02/24/2023)   PRAPARE - Administrator, Civil Service (Medical): Patient unable to answer    Lack of Transportation (Non-Medical): Patient unable to answer  Physical Activity: Not on file  Stress: Not on file  Social Connections: Not on file   Family History  Problem Relation Age of Onset   Seizures Father    Scheduled Meds:  baclofen  10 mg Oral BID   enoxaparin (LOVENOX) injection  40 mg Subcutaneous Q24H   gabapentin  100 mg Oral TID   mouth rinse  15 mL Mouth Rinse Q2H   mouth rinse  15 mL Mouth Rinse 4 times per day   zonisamide  100 mg Oral BID   [START ON 02/26/2023] zonisamide  200 mg Oral QHS   Continuous Infusions:  cefTRIAXone (ROCEPHIN)  IV 1 g (02/25/23 1233)   dextrose 5 % and 0.9 % NaCl 75 mL/hr at 02/25/23 0901   lacosamide (VIMPAT) IV 200 mg (02/25/23 1049)   levETIRAcetam 1,500 mg (02/25/23 0706)   valproate sodium     PRN Meds:.acetaminophen, bisacodyl, hydrALAZINE, LORazepam, LORazepam, morphine injection, [DISCONTINUED] ondansetron **OR** ondansetron (ZOFRAN) IV, mouth rinse, mouth rinse Medications Prior to Admission:  Prior to Admission medications   Medication Sig Start Date End Date Taking? Authorizing Provider  acetaminophen (TYLENOL) 500 MG tablet Take 1 tablet (500 mg total) by mouth every 6 (six) hours as needed for mild pain (or Fever >/= 101). 08/04/22  Yes Leeroy Bock, MD  ammonium lactate (AMLACTIN) 12 % cream Apply 1 Application topically at bedtime. Apply to bilateral feet and legs for dry skin 01/30/23  Yes [provider]  atorvastatin (LIPITOR) 40 MG tablet Take 40 mg by mouth daily.   Yes [provider]  Baclofen 5 MG TABS Take 2 tablets by mouth 2 (two) times daily. 07/24/22  Yes [provider]  bisacodyl (DULCOLAX) 10 MG suppository Place 10 mg rectally as needed for moderate constipation.   Yes [provider]  cetirizine (ZYRTEC) 10 MG  tablet Take 10 mg by mouth daily.   Yes [provider]  divalproex (DEPAKOTE SPRINKLE) 125 MG capsule Take 500 mg by mouth 2 (two) times daily. 11/06/22  Yes [provider]  DULoxetine HCl 40 MG CPEP Take 1 capsule by mouth daily. 02/19/23  Yes [provider]  esomeprazole (NEXIUM) 20 MG capsule Take 20 mg by mouth daily. 02/19/23  Yes [provider]  gabapentin (NEURONTIN)  100 MG capsule Take 100 mg by mouth 3 (three) times daily.   Yes [provider]  Glucagon, rDNA, (GLUCAGON EMERGENCY) 1 MG KIT SMARTSIG:1 Milligram(s) IM Once PRN 07/14/22  Yes [provider]  guaiFENesin (ROBITUSSIN) 100 MG/5ML liquid Take 5 mLs by mouth every 6 (six) hours as needed for cough or to loosen phlegm.   Yes [provider]  lacosamide (VIMPAT) 200 MG TABS tablet Take 200 mg by mouth 2 (two) times daily.   Yes [provider]  lactulose (CHRONULAC) 10 GM/15ML solution Take 30 mLs by mouth 2 (two) times daily.   Yes [provider]  levETIRAcetam (KEPPRA) 1000 MG tablet Take 1,000 mg by mouth 2 (two) times daily. Take with 250 mg for a total 1250 mg 02/20/23  Yes [provider]  levETIRAcetam (KEPPRA) 250 MG tablet 250 mg 2 (two) times daily. Take with 1000 mg for a total of 1250 mg 02/20/23  Yes [provider]  lisinopril (ZESTRIL) 20 MG tablet Take 20 mg by mouth daily.   Yes [provider]  LORazepam (ATIVAN) 0.5 MG tablet Take 1 tablet (0.5 mg total) by mouth 2 (two) times daily as needed for anxiety. Patient taking differently: Take 0.5 mg by mouth every 6 (six) hours as needed for anxiety. 08/04/22  Yes Leeroy Bock, MD  LORazepam (ATIVAN) 2 MG/ML injection Inject 1 mg into the muscle once. Only use if having a seizer 01/30/23  Yes [provider]  metoprolol tartrate (LOPRESSOR) 25 MG tablet Take 25 mg by mouth 2 (two) times daily.   Yes [provider]  Multiple Vitamins-Minerals  (ONE-DAILY MULTI-VIT/MINERAL) TABS Take 1 tablet by mouth daily. 06/05/22  Yes [provider]  oxyCODONE (OXY IR/ROXICODONE) 5 MG immediate release tablet Take 5 mg by mouth every 6 (six) hours as needed. 12/16/22  Yes [provider]  senna (SENOKOT) 8.6 MG TABS tablet Take 2 tablets by mouth 2 (two) times daily.   Yes [provider]  sodium chloride (DEEP SEA NASAL SPRAY) 0.65 % nasal spray Place 1 spray into the nose every 4 (four) hours as needed. 08/25/22  Yes [provider]  sodium phosphate Pediatric (FLEET) 3.5-9.5 GM/59ML enema Place 1 enema rectally. Only Tuesday and Wednesday   Yes [provider]  Vitamin D, Cholecalciferol, 25 MCG (1000 UT) TABS Take 1 tablet by mouth daily. 02/05/23  Yes [provider]  zonisamide (ZONEGRAN) 100 MG capsule Take 1 capsule (100 mg total) by mouth 2 (two) times daily. Patient taking differently: Take 200 mg by mouth at bedtime. 09/30/16  Yes Delfino Lovett, MD  divalproex (DEPAKOTE SPRINKLE) 125 MG capsule Take 3 capsules (375 mg total) by mouth 2 (two) times daily. 08/31/22 09/30/22  Merwyn Katos, MD  DULoxetine (CYMBALTA) 20 MG capsule Take 20 mg by mouth 2 (two) times daily. Patient not taking: Reported on 02/23/2023 07/24/22   [provider]  levETIRAcetam (KEPPRA) 100 MG/ML solution Take 10 mLs (1,000 mg total) by mouth 2 (two) times daily. Patient not taking: Reported on 02/23/2023 09/30/16   Delfino Lovett, MD  ondansetron (ZOFRAN) 4 MG tablet Take 1 tablet (4 mg total) by mouth every 8 (eight) hours as needed for nausea or vomiting. 08/04/22   Leeroy Bock, MD  pantoprazole (PROTONIX) 40 MG tablet Take 40 mg by mouth 2 (two) times daily. Patient not taking: Reported on 02/23/2023 05/08/22   [provider]  promethazine (PHENERGAN) 25 MG/ML injection Inject 25 mg  into the muscle every 4 (four) hours as needed for vomiting or nausea. Patient not taking: Reported on 02/23/2023    [provider]   Allergies  Allergen Reactions   Tramadol     Other reaction(s): Unknown   Review of Systems  Unable to perform ROS   Physical Exam Pulmonary:     Effort: Pulmonary effort is normal.  Neurological:     Mental Status: She is alert.     Comments: Nonverbal.     Vital Signs: BP (!) 150/97 (BP Location: Left Arm)   Pulse 99   Temp 98.5 F (36.9 C)   Resp 20   Ht 5\' 6"  (1.676 m)   Wt 68 kg   LMP  (LMP Unknown)   SpO2 100%   BMI 24.19 kg/m  Pain Scale: PAINAD POSS *See Group Information*: 1-Acceptable,Awake and alert Pain Score: 1    SpO2: SpO2: 100 % O2 Device:SpO2: 100 % O2 Flow Rate: .O2 Flow Rate (L/min): 3 L/min  IO: Intake/output summary:  Intake/Output Summary (Last 24 hours) at 02/25/2023 1532 Last data filed at 02/25/2023 1400 Gross per 24 hour  Intake 1338.48 ml  Output 650 ml  Net 688.48 ml    LBM:   Baseline Weight: Weight: 68 kg Most recent weight: Weight: 68 kg      Signed by: Morton Stall, NP   Please contact Palliative Medicine Team phone at [phone] for questions and concerns.  For individual provider: See Loretha Stapler

## 2023-02-25 NOTE — Progress Notes (Signed)
Patient is LTC resident from [facility].

## 2023-02-25 NOTE — Progress Notes (Signed)
his patient has nothing for pain control. she is grimacing and moaning and I read she had chronic pain. can we get her something]

## 2023-02-26 DIAGNOSIS — G40019 Localization-related (focal) (partial) idiopathic epilepsy and epileptic syndromes with seizures of localized onset, intractable, without status epilepticus: Secondary | ICD-10-CM

## 2023-02-26 DIAGNOSIS — R569 Unspecified convulsions: Secondary | ICD-10-CM | POA: Diagnosis not present

## 2023-02-26 LAB — BASIC METABOLIC PANEL
Anion gap: 9 (ref 5–15)
BUN: 12 mg/dL (ref 6–20)
CO2: 20 mmol/L — ABNORMAL LOW (ref 22–32)
Calcium: 9.1 mg/dL (ref 8.9–10.3)
Chloride: 107 mmol/L (ref 98–111)
Creatinine, Ser: 0.93 mg/dL (ref 0.44–1.00)
GFR, Estimated: >60 mL/min (ref >60)
Glucose, Bld: 75 mg/dL (ref 70–99)
Potassium: 3.6 mmol/L (ref 3.5–5.1)
Sodium: 136 mmol/L (ref 135–145)

## 2023-02-26 LAB — PHOSPHORUS: Phosphorus: 4.1 mg/dL (ref 2.5–4.6)

## 2023-02-26 LAB — CBC
HCT: 33.2 % — ABNORMAL LOW (ref 36.0–46.0)
Hemoglobin: 11.3 g/dL — ABNORMAL LOW (ref 12.0–15.0)
MCH: 35.3 pg — ABNORMAL HIGH (ref 26.0–34.0)
MCHC: 34 g/dL (ref 30.0–36.0)
MCV: 103.8 fL — ABNORMAL HIGH (ref 80.0–100.0)
Platelets: 205 10*3/uL (ref 150–400)
RBC: 3.2 MIL/uL — ABNORMAL LOW (ref 3.87–5.11)
RDW: 14.4 % (ref 11.5–15.5)
WBC: 4.3 10*3/uL (ref 4.0–10.5)
nRBC: 0 % (ref 0.0–0.2)

## 2023-02-26 LAB — MAGNESIUM: Magnesium: 2 mg/dL (ref 1.7–2.4)

## 2023-02-26 MED ORDER — CYANOCOBALAMIN 1000 MCG/ML IJ SOLN: 1000.0000 ug | INTRAMUSCULAR | Status: DC

## 2023-02-26 MED ORDER — DIVALPROEX SODIUM 125 MG PO CSDR
500.0000 mg | DELAYED_RELEASE_CAPSULE | Freq: Two times a day (BID) | ORAL | Status: DC
  Administered 2023-02-26 – 2023-02-28 (×3): 500 mg via ORAL
  Filled 2023-02-26 (×4): qty 4

## 2023-02-26 MED ORDER — LACOSAMIDE 50 MG PO TABS: 200.0000 mg | ORAL_TABLET | Freq: Two times a day (BID) | ORAL | Status: DC

## 2023-02-26 MED ORDER — CYANOCOBALAMIN 1000 MCG/ML IJ SOLN
1000.0000 ug | INTRAMUSCULAR | Status: DC
  Administered 2023-03-05: 1000 ug via INTRAMUSCULAR
  Filled 2023-02-26: qty 1

## 2023-02-26 MED ORDER — CYANOCOBALAMIN 1000 MCG/ML IJ SOLN
1000.0000 ug | Freq: Every day | INTRAMUSCULAR | Status: AC
  Administered 2023-02-26 – 2023-03-04 (×7): 1000 ug via INTRAMUSCULAR
  Filled 2023-02-26 (×7): qty 1

## 2023-02-26 MED ORDER — OXYCODONE HCL 5 MG PO TABS
5.0000 mg | ORAL_TABLET | Freq: Four times a day (QID) | ORAL | Status: DC | PRN
  Administered 2023-02-26 – 2023-03-04 (×2): 5 mg via ORAL
  Filled 2023-02-26 (×2): qty 1

## 2023-02-26 MED ORDER — LEVETIRACETAM 500 MG PO TABS
1500.0000 mg | ORAL_TABLET | Freq: Two times a day (BID) | ORAL | Status: DC
  Administered 2023-02-26 – 2023-02-28 (×3): 1500 mg via ORAL
  Filled 2023-02-26 (×4): qty 3

## 2023-02-26 MED ORDER — DIVALPROEX SODIUM 125 MG PO CSDR
250.0000 mg | DELAYED_RELEASE_CAPSULE | Freq: Two times a day (BID) | ORAL | Status: DC
  Filled 2023-02-26: qty 2

## 2023-02-26 MED ORDER — LACOSAMIDE 50 MG PO TABS
200.0000 mg | ORAL_TABLET | Freq: Two times a day (BID) | ORAL | Status: DC
  Administered 2023-02-26 – 2023-02-28 (×3): 200 mg via ORAL
  Filled 2023-02-26 (×5): qty 4

## 2023-02-26 NOTE — Plan of Care (Signed)
Problem: Clinical Measurements: Goal: Respiratory complications will improve Outcome: Progressing   Problem: Clinical Measurements: Goal: Cardiovascular complication will be avoided Outcome: Progressing   Problem: Nutrition: Goal: Adequate nutrition will be maintained Outcome: Progressing   Problem: Pain Managment: Goal: General experience of comfort will improve Outcome: Progressing   Problem: Safety: Goal: Ability to remain free from injury will improve Outcome: Progressing

## 2023-02-26 NOTE — Progress Notes (Signed)
[facility] Progress Note  Patient: Katherine Fields    UJW:[id number]  DOA: 02/23/2023     Date of Service: the patient was seen and examined on 02/26/2023  Chief Complaint  Patient presents with   Seizures    History of seizures - Patient brought in by EMS from [facility] for seizures; Was given Ativan 1 mg IM by WOM, patient continued to have focal seizures, IV was established by EMS and an additional Versed 5 mg IV was administered; Patient is somnolent upon arrival but does respond to verbal stimuli, Pupils are equal, round and reactive   Brief hospital course: Katherine Fields is a 57 y.o. 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    Of note: recent outpatient neurology visit at [facility] last Friday 09/20, concern for elevated GAD Ab as cause for seizures, patient was referred to see neuro immunology clinic for possible intervention. Rx recs at that time: increasing Keppra from 1000 twice daily to 1250 twice daily;continue Depakote, Vimpat gabapentin and zonisamide same doses.      Hospital course / significant events:  09/23: received Ativan en route w/ EMS, postictal/sedated in ED. Labs unremarkable. CT head no acute findings. UA showed WBC 11-20. [facility] neurology physician was contacted by [facility] ED physician - they recommended increase Keppra to 1500 mg twice daily, Depakote 500 mg twice daily and zonisamide to 200 mg twice daily. Admitting hospitalist left messages to patient daughter.   09/24: seizure overnight per RN requiring IV ativan. This morning, daughters state she is about at her baseline. SLP eval - essentially no swallow function, switched all meds to IV for now, if not recovering may need to consider palliative consult tomorrow      Consultants:  EDP spoke w/  pt's outpatient neurologist, I discussed plan today w/ Dr Iver Nestle but no formal consult placed    Procedures/Surgeries: None    Assessment and Plan: Breakthrough seizure Given dysphagia, question not swallowing meds well might be a reason for breakthrough seizures?  RN reports multiple overnight seizure needing IV ativan Today EEG normal (post/inter-interictal) ED physician discussed case patient's neurology in [facility], who recommended Medication regimen now: 9/24 increased Keppra from 1250 to 1500 mg twice daily 9/25 started Depacon 250 mg IV every 6 hourly  Continue Vimpat 200 mg q12h 9/25 zonisamide 100 mg p.o. twice daily and 200 mg nightly from 9/26 Gabapentin 100 mg p.o. 3 times daily Baclofen 10 mg p.o. twice daily IV Ativan as needed for seizures Neurology consult appreciated Continue seizure precaution, Aspiration precaution SLP eval done and started pured diet Continue IV fluid for gentle hydration Palliative care consulted for goals of care discussion with family  Neurology recommended (( -Continue baclofen 10 mg twice daily (home dose) -Continue Vimpat 200 mg twice daily, changed to PO -Continue Depakote total daily dose of 1 g, changed to PO -Continue Keppra 1500 twice daily, changed to PO -Continue Zonisamide 100 in the morning and 300 at night (100 mg twice daily with additional 200 mg capsule nightly) -Benzodiazepines (onfi, clonazepam) may be a good alternative to consider in the future for dual treatment of stiff person syndrome and seizures but defer this to outpatient neurologist -Continue gabapentin 100 mg 3 times daily -Outpatient consideration of IVIG -Given expense of intranasal midazolam, I have ordered Ativan solution to be given  into the cheek, 2 mg up to 2 doses 5 minutes apart for seizure activity lasting greater than 5 minutes, this should be included in the discharge medications))  Overall prognosis is poor with frequent seizure breakthrough recently  and patient is DNR, expect patient follow-up with [facility] neuro immunology for further testing and management, pending clinical course here.  Switched what could be switched to IV, only other IV anti-seizure meds is phenobarbital, given we've increased several meds will hopefully take zonisamide po as well     UTI vs asymptomatic bacteriuria - unable to assess symptoms, given cognition cannot obtain hx  Ceftriaxone IV daily for 5 days Urine culture was never obtained   Hx CVA with residual left-sided gaze and functional quadriplegia, dysphagia No acute concern CT head reassuring   Chronic dysphagia - worse now question postictal or med effects vs new baseline, question not swallowing meds well as reason for breakthrough seizures?  On pured diet at baseline, seen by SLP, started on pured diet again on 9/25    HTN BP initially low, held home lisinopril and metoprolol d/t cannot take po, hydralazine prn  Vitamin B12 deficiency: Started vitamin B12 1000 mcg IM injection.  Body mass index is 24.19 kg/m.  Nutrition Problem: Inadequate oral intake Etiology: dysphagia Interventions: Interventions: Magic cup, MVI   Diet: Pured DVT Prophylaxis: Subcutaneous Lovenox   Advance goals of care discussion: DNR/DNI and NO feeding Tube  Family Communication: family was not present at bedside, at the time of interview.  The pt provided permission to discuss medical plan with the family. Opportunity was given to ask question and all questions were answered satisfactorily.  9/26 discussed with patient's daughter at bedside  Disposition:  Pt is LTC [facility] from [facility]. , admitted with intractable seizures, still has seizure, which precludes a safe discharge. Discharge to same facility, when cleared by Neurologist.  Subjective: No significant events overnight, no more seizures.  Patient is taking oral medications as per RN but needs a little bit more patients.  No any other  complaints.   Physical Exam: General: NAD, comfortable.   Eyes: PERRLA ENT: Oral Mucosa Clear, dry Neck: no JVD,  Cardiovascular: S1 and S2 Present, no Murmur,  Respiratory: good respiratory effort, Bilateral Air entry equal and Decreased, no Crackles, no wheezes Abdomen: Bowel Sound present, Soft and no tenderness,  Skin: no rashes Extremities: no Pedal edema, no calf tenderness Neurologic: Stiffness of all extremities due to stiff man syndrome Nonambulatory at baseline  Vitals:   02/26/23 0155 02/26/23 0443 02/26/23 0913 02/26/23 1129  BP: (!) 127/46 135/78 (!) 158/95 (!) 175/97  Pulse: 79 73 80 92  Resp:  17 18 20   Temp:  98.3 F (36.8 C) 98.2 F (36.8 C) 98 F (36.7 C)  TempSrc:  Axillary Oral Oral  SpO2:  100% 100% 100%  Weight:      Height:        Intake/Output Summary (Last 24 hours) at 02/26/2023 1529 Last data filed at 02/26/2023 0700 Gross per 24 hour  Intake 1783.92 ml  Output 1000 ml  Net 783.92 ml   Filed Weights   02/23/23 1225  Weight: 68 kg    Data Reviewed: I have personally reviewed and interpreted daily labs, tele strips, imagings as discussed above. I reviewed all nursing notes, pharmacy notes, vitals, pertinent old records I have discussed plan of care as described above with RN and patient/family.  CBC: Recent Labs  Lab 02/23/23 1225 02/25/23 0857 02/26/23 8315  WBC 6.2 6.9 4.3  HGB 11.1* 11.4* 11.3*  HCT 33.2* 32.2* 33.2*  MCV 107.1* 100.9* 103.8*  PLT 221 225 205   Basic Metabolic Panel: Recent Labs  Lab 02/23/23 1225 02/25/23 0857 02/26/23 0616  NA 137 135 136  K 3.7 3.5 3.6  CL 105 105 107  CO2 18* 21* 20*  GLUCOSE 104* 76 75  BUN 24* 12 12  CREATININE 1.15* 0.87 0.93  CALCIUM 9.1 9.0 9.1  MG  --  1.9 2.0  PHOS  --  3.7 4.1    Studies: No results found.  Scheduled Meds:  baclofen  10 mg Oral BID   cyanocobalamin  1,000 mcg Intramuscular Daily   Followed by   Melene Muller ON 03/05/2023] cyanocobalamin  1,000 mcg  Intramuscular Weekly   Followed by   Melene Muller ON 04/02/2023] cyanocobalamin  1,000 mcg Intramuscular Q30 days   divalproex  500 mg Oral Q12H   enoxaparin (LOVENOX) injection  40 mg Subcutaneous Q24H   gabapentin  100 mg Oral TID   lacosamide  200 mg Oral BID   levETIRAcetam  1,500 mg Oral BID   multivitamin with minerals  1 tablet Oral Daily   zonisamide  100 mg Oral BID   zonisamide  200 mg Oral QHS   Continuous Infusions:  cefTRIAXone (ROCEPHIN)  IV 1 g (02/26/23 1352)   dextrose 5 % and 0.9 % NaCl 75 mL/hr at 02/26/23 0700   PRN Meds: acetaminophen, bisacodyl, hydrALAZINE, LORazepam, LORazepam, morphine injection, [DISCONTINUED] ondansetron **OR** ondansetron (ZOFRAN) IV, mouth rinse, oxyCODONE  Time spent: 35 minutes  Author: Gillis Santa. MD [facility] 02/26/2023 3:29 PM  To reach On-call, see care teams to locate the attending and reach out to them via [email or web address]. If 7PM-7AM, please contact night-coverage If you still have difficulty reaching the attending provider, please page the [facility] (Director on Call) for [facility] on amion for assistance.

## 2023-02-26 NOTE — Plan of Care (Signed)
  Problem: Clinical Measurements: Goal: Ability to maintain clinical measurements within normal limits will improve Outcome: Progressing Goal: Will remain free from infection Outcome: Progressing Goal: Diagnostic test results will improve Outcome: Progressing Goal: Respiratory complications will improve Outcome: Progressing Goal: Cardiovascular complication will be avoided Outcome: Progressing   Problem: Activity: Goal: Risk for activity intolerance will decrease Outcome: Progressing   Problem: Nutrition: Goal: Adequate nutrition will be maintained Outcome: Progressing   Problem: Coping: Goal: Level of anxiety will decrease Outcome: Progressing   Problem: Elimination: Goal: Will not experience complications related to bowel motility Outcome: Progressing Goal: Will not experience complications related to urinary retention Outcome: Progressing   Problem: Pain Managment: Goal: General experience of comfort will improve Outcome: Progressing   Problem: Safety: Goal: Ability to remain free from injury will improve Outcome: Progressing   Problem: Skin Integrity: Goal: Risk for impaired skin integrity will decrease Outcome: Progressing   Problem: Education: Goal: Knowledge of General Education information will improve Description: Including pain rating scale, medication(s)/side effects and non-pharmacologic comfort measures Outcome: Not Progressing Note: Patient has impaired cognition. Will continue to educate as needed.    Problem: Health Behavior/Discharge Planning: Goal: Ability to manage health-related needs will improve Outcome: Not Progressing Note: Patient has impaired cognition. Will continue to educate as needed.

## 2023-02-26 NOTE — Progress Notes (Signed)
Neurology Progress Note  Patient ID: Terasha Zipay is a 57 y.o. with PMHx of  has a past medical history of Chronic pain, G tube feedings (HCC), ICH (intracerebral hemorrhage) (HCC), Protein calorie malnutrition (HCC), and Stiffman syndrome.  Subjective: - Appears uncomfortable, morphine received at 7 AM  Exam: Vitals:   02/26/23 0443 02/26/23 0913  BP: 135/78 (!) 158/95  Pulse: 73 80  Resp: 17 18  Temp: 98.3 F (36.8 C) 98.2 F (36.8 C)  SpO2: 100% 100%   Gen: In bed, grunting, appears very uncomfortable  Resp: non-labored breathing, no grossly audible wheezing Cardiac: Perfusing extremities well  Abd: soft, nt   Witnessed event during my examination in the morning: Right upper extremity flexion contracture with a right head turn and right gaze deviation, repetitive dorsi and plantarflexion of both feet concurrently (unclear if this was suppressible as it resolved by the time I was able to touch her feet).  Event lasted less than 1 minute.  Postevent right facial droop  Neuro: MS: Awake, alert, following simple commands, slower to nod yes and no than she was yesterday CN: Right facial droop on grimace, eyes move bilaterally spontaneously, does not clearly track Motor: Grabs RUE with LUE, nods yes once when asked if that arm is hurting her but subsequently does not respond. Moves both legs slightly to command    Pertinent Labs:  Basic Metabolic Panel: Recent Labs  Lab 02/23/23 1225 02/25/23 0857 02/26/23 0616  NA 137 135 136  K 3.7 3.5 3.6  CL 105 105 107  CO2 18* 21* 20*  GLUCOSE 104* 76 75  BUN 24* 12 12  CREATININE 1.15* 0.87 0.93  CALCIUM 9.1 9.0 9.1  MG  --  1.9 2.0  PHOS  --  3.7 4.1    CBC: Recent Labs  Lab 02/23/23 1225 02/25/23 0857 02/26/23 0616  WBC 6.2 6.9 4.3  HGB 11.1* 11.4* 11.3*  HCT 33.2* 32.2* 33.2*  MCV 107.1* 100.9* 103.8*  PLT 221 225 205    Coagulation Studies: No results for input(s): "LABPROT", "INR" in the last 72 hours.     GAD65 Ab, Serum 3290 (H) <=0.02 nmol/L  11/27/2022 11:22 AM EDT    Assessment: Lengthy discussion with the daughter Ronta at the bedside as well as Sheara by phone.  We discussed how the event I witnessed was concerning for a focal seizure, but brief and self-limited.  At this time they feel that her seizure frequency is at recent baseline although it did increase in the last 1 to 2 months.  We reviewed risk/benefit of IVIG at length and they elected further discussion of this with outpatient specialists rather than proceeding with treatment in the hospital at this time.  They report they are not considering VNS placement at this time as they do not feel surgical interventions would be within goals of care, defer further discussion of this to outpatient neurologist as they do feel that her current dose of seizure medications is making her very sleepy (VNS placement at times can allow reduction of seizure medications and improvement in quality of life).   Discharge timing per primary team, discussed with family that Dr. Lucianne Muss appropriately wants to confirm that the patient is able to reliably take her medications by mouth prior to discharge  Impression: - GAD antibody positive stiff person syndrome - Intractable epilepsy - Minimally verbal at baseline but able to follow commands and take PO   Recommendations: -Continue baclofen 10 mg twice daily (home dose) -Continue  Vimpat 200 mg twice daily, changed to PO -Continue Depakote total daily dose of 1 g, changed to PO -Continue Keppra 1500 twice daily, changed to PO -Continue Zonisamide 100 in the morning and 300 at night (100 mg twice daily with additional 200 mg capsule nightly) -Benzodiazepines (onfi, clonazepam) may be a good alternative to consider in the future for dual treatment of stiff person syndrome and seizures but defer this to outpatient neurologist -Continue gabapentin 100 mg 3 times daily -Outpatient consideration of IVIG -Given  expense of intranasal midazolam, I have ordered Ativan solution to be given into the cheek, 2 mg up to 2 doses 5 minutes apart for seizure activity lasting greater than 5 minutes, this should be included in the discharge medications -Medical clearance per primary team, inpatient neurology will sign off at this time but do not hesitate to reach out if additional questions or concerns arise.  Discussed with primary team, SLP and nursing via secure chat  Brooke Dare MD-PhD [facility] [phone]   Greater than 50 minutes spent in care of this patient, majority of bedside.  Discussed with primary team via secure chat

## 2023-02-27 ENCOUNTER — Inpatient Hospital Stay: Payer: Medicaid Other

## 2023-02-27 DIAGNOSIS — R569 Unspecified convulsions: Secondary | ICD-10-CM | POA: Diagnosis not present

## 2023-02-27 DIAGNOSIS — Z7189 Other specified counseling: Secondary | ICD-10-CM | POA: Diagnosis not present

## 2023-02-27 LAB — BASIC METABOLIC PANEL
Anion gap: 10 (ref 5–15)
BUN: 11 mg/dL (ref 6–20)
CO2: 21 mmol/L — ABNORMAL LOW (ref 22–32)
Calcium: 9.3 mg/dL (ref 8.9–10.3)
Chloride: 107 mmol/L (ref 98–111)
Creatinine, Ser: 0.91 mg/dL (ref 0.44–1.00)
GFR, Estimated: >60 mL/min (ref >60)
Glucose, Bld: 78 mg/dL (ref 70–99)
Potassium: 3.4 mmol/L — ABNORMAL LOW (ref 3.5–5.1)
Sodium: 138 mmol/L (ref 135–145)

## 2023-02-27 LAB — PHOSPHORUS: Phosphorus: 3.1 mg/dL (ref 2.5–4.6)

## 2023-02-27 LAB — VALPROIC ACID LEVEL: Valproic Acid Lvl: 38 ug/mL — ABNORMAL LOW (ref 50.0–100.0)

## 2023-02-27 LAB — CBC
HCT: 31.2 % — ABNORMAL LOW (ref 36.0–46.0)
Hemoglobin: 11.1 g/dL — ABNORMAL LOW (ref 12.0–15.0)
MCH: 36.4 pg — ABNORMAL HIGH (ref 26.0–34.0)
MCHC: 35.6 g/dL (ref 30.0–36.0)
MCV: 102.3 fL — ABNORMAL HIGH (ref 80.0–100.0)
Platelets: 216 10*3/uL (ref 150–400)
RBC: 3.05 MIL/uL — ABNORMAL LOW (ref 3.87–5.11)
RDW: 14.5 % (ref 11.5–15.5)
WBC: 4.4 10*3/uL (ref 4.0–10.5)
nRBC: 0 % (ref 0.0–0.2)

## 2023-02-27 LAB — GLUCOSE, CAPILLARY: Glucose-Capillary: 78 mg/dL (ref 70–99)

## 2023-02-27 LAB — LACOSAMIDE: Lacosamide: 8.1 ug/mL (ref 5.0–10.0)

## 2023-02-27 LAB — MAGNESIUM: Magnesium: 1.9 mg/dL (ref 1.7–2.4)

## 2023-02-27 MED ORDER — SODIUM CHLORIDE 0.9 % IV SOLN
200.0000 mg | Freq: Once | INTRAVENOUS | Status: AC
  Administered 2023-02-27: 200 mg via INTRAVENOUS
  Filled 2023-02-27: qty 20

## 2023-02-27 MED ORDER — LEVETIRACETAM IN NACL 1500 MG/100ML IV SOLN
1500.0000 mg | Freq: Once | INTRAVENOUS | Status: AC
  Administered 2023-02-27: 1500 mg via INTRAVENOUS
  Filled 2023-02-27: qty 100

## 2023-02-27 MED ORDER — SODIUM CHLORIDE 0.9 % IV SOLN
1.0000 g | INTRAVENOUS | Status: AC
  Administered 2023-02-28: 1 g via INTRAVENOUS
  Filled 2023-02-27: qty 10

## 2023-02-27 MED ORDER — VALPROATE SODIUM 100 MG/ML IV SOLN
500.0000 mg | Freq: Once | INTRAVENOUS | Status: AC
  Administered 2023-02-27: 500 mg via INTRAVENOUS
  Filled 2023-02-27: qty 5

## 2023-02-27 NOTE — Plan of Care (Signed)
°  Problem: Clinical Measurements: °Goal: Respiratory complications will improve °Outcome: Progressing °  °Problem: Clinical Measurements: °Goal: Cardiovascular complication will be avoided °Outcome: Progressing °  °Problem: Pain Managment: °Goal: General experience of comfort will improve °Outcome: Progressing °  °Problem: Safety: °Goal: Ability to remain free from injury will improve °Outcome: Progressing °  °

## 2023-02-27 NOTE — Progress Notes (Signed)
[facility] Progress Note  Patient: Katherine Fields    WGN:[id number]  DOA: 02/23/2023     Date of Service: the patient was seen and examined on 02/27/2023  Chief Complaint  Patient presents with   Seizures    History of seizures - Patient brought in by EMS from [facility] for seizures; Was given Ativan 1 mg IM by WOM, patient continued to have focal seizures, IV was established by EMS and an additional Versed 5 mg IV was administered; Patient is somnolent upon arrival but does respond to verbal stimuli, Pupils are equal, round and reactive   Brief hospital course: Katherine Fields is a 57 y.o. 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    Of note: recent outpatient neurology visit at [facility] last Friday 09/20, concern for elevated GAD Ab as cause for seizures, patient was referred to see neuro immunology clinic for possible intervention. Rx recs at that time: increasing Keppra from 1000 twice daily to 1250 twice daily;continue Depakote, Vimpat gabapentin and zonisamide same doses.      Hospital course / significant events:  09/23: received Ativan en route w/ EMS, postictal/sedated in ED. Labs unremarkable. CT head no acute findings. UA showed WBC 11-20. [facility] neurology physician was contacted by [facility] ED physician - they recommended increase Keppra to 1500 mg twice daily, Depakote 500 mg twice daily and zonisamide to 200 mg twice daily. Admitting hospitalist left messages to patient daughter.   09/24: seizure overnight per RN requiring IV ativan. This morning, daughters state she is about at her baseline. SLP eval - essentially no swallow function, switched all meds to IV for now, if not recovering may need to consider palliative consult tomorrow      Consultants:  EDP spoke w/  pt's outpatient neurologist, I discussed plan today w/ Dr Iver Nestle but no formal consult placed    Procedures/Surgeries: None    Assessment and Plan: Breakthrough seizure Given dysphagia, question not swallowing meds well might be a reason for breakthrough seizures?  RN reports multiple overnight seizure needing IV ativan Today EEG normal (post/inter-interictal) ED physician discussed case patient's neurology in [facility], who recommended Medication regimen now: 9/24 increased Keppra from 1250 to 1500 mg twice daily 9/25 started Depacon 250 mg IV every 6 hourly  Continue Vimpat 200 mg q12h 9/25 zonisamide 100 mg p.o. twice daily and 200 mg nightly from 9/26 Gabapentin 100 mg p.o. 3 times daily Baclofen 10 mg p.o. twice daily IV Ativan as needed for seizures > 5 minutes duration Neurology consult appreciated Continue seizure precaution, Aspiration precaution SLP eval done and started pured diet S/p IVF for hydration Palliative care consulted for goals of care discussion with family  Neurology recommended (( -Continue baclofen 10 mg twice daily (home dose) -Continue Vimpat 200 mg twice daily, changed to PO -Continue Depakote total daily dose of 1 g, changed to PO -Continue Keppra 1500 twice daily, changed to PO -Continue Zonisamide 100 in the morning and 300 at night (100 mg twice daily with additional 200 mg capsule nightly) -Benzodiazepines (onfi, clonazepam) may be a good alternative to consider in the future for dual treatment of stiff person syndrome and seizures but defer this to outpatient neurologist -Continue gabapentin 100 mg 3 times daily -Outpatient consideration of IVIG -Given expense of intranasal midazolam, I have ordered Ativan solution to  be given into the cheek, 2 mg up to 2 doses 5 minutes apart for seizure activity lasting greater than 5 minutes, this should be included in the discharge medications))  Overall prognosis is poor with frequent seizure breakthrough  recently and patient is DNR, expect patient follow-up with [facility] neuro immunology for further testing and management, pending clinical course here.  Switched what could be switched to IV, only other IV anti-seizure meds is phenobarbital, given we've increased several meds will hopefully take zonisamide po as well    9/27 patient had a seizure in the morning before her a.m. dose, so Ativan was given after 3 minutes of seizure activity.  Patient was unable to take oral meds afterwards so 1 dose of IV medication given.  9/27 discussed with patient's daughter over the phone regarding starting IV IgG for 5 days now and transfer to [facility] if needed next week.  But she was unable to make any decisions over the phone.  All sisters are coming tomorrow around 26 AM to discuss treatment options in person and then make a decision.  UTI vs asymptomatic bacteriuria - unable to assess symptoms, given cognition cannot obtain hx  Ceftriaxone IV daily for 5 days Urine culture was never obtained   Hx CVA with residual left-sided gaze and functional quadriplegia, dysphagia No acute concern CT head reassuring   Chronic dysphagia - worse now question postictal or med effects vs new baseline, question not swallowing meds well as reason for breakthrough seizures?  On pured diet at baseline, seen by SLP, started on pured diet again on 9/25    HTN BP initially low, held home lisinopril and metoprolol d/t cannot take po, hydralazine prn  Vitamin B12 deficiency: Started vitamin B12 1000 mcg IM injection.  Body mass index is 24.19 kg/m.  Nutrition Problem: Inadequate oral intake Etiology: dysphagia Interventions: Interventions: Magic cup, MVI   Diet: Pured DVT Prophylaxis: Subcutaneous Lovenox   Advance goals of care discussion: DNR/DNI and NO feeding Tube  Family Communication: family was not present at bedside, at the time of interview.  The pt provided permission to discuss medical plan with the  family. Opportunity was given to ask question and all questions were answered satisfactorily.  9/26 discussed with patient's daughter at bedside  Disposition:  Pt is LTC resident from [facility]. , admitted with intractable seizures, still has seizure, which precludes a safe discharge. Discharge to same facility, when cleared by Neurologist.  Subjective: No significant events overnight, in the morning time patient had an episode of seizures so discharge was canceled.  Patient remained seizure-free afterwards Further discussion needs to be done tomorrow a.m. with all this started around 11 AM.    Physical Exam: General: NAD, comfortable.   Eyes: PERRLA ENT: Oral Mucosa Clear, dry Neck: no JVD,  Cardiovascular: S1 and S2 Present, no Murmur,  Respiratory: good respiratory effort, Bilateral Air entry equal and Decreased, no Crackles, no wheezes Abdomen: Bowel Sound present, Soft and no tenderness,  Skin: no rashes Extremities: no Pedal edema, no calf tenderness Neurologic: Stiffness of all extremities due to stiff man syndrome Nonambulatory at baseline  Vitals:   02/27/23 0355 02/27/23 0913 02/27/23 1044 02/27/23 1049  BP: (!) 142/84 (!) 174/105 (!) 169/85 (!) 157/84  Pulse: 91 91 (!) 138 (!) 133  Resp: 18 18  20   Temp: 98.4 F (36.9 C) 97.9 F (36.6 C)  98.2 F (36.8 C)  TempSrc:  Oral    SpO2: 100% 100% 100% 100%  Weight:      Height:        Intake/Output Summary (Last 24 hours) at 02/27/2023 1329 Last data filed at 02/27/2023 0900 Gross per 24 hour  Intake 673.7 ml  Output 850 ml  Net -176.3 ml   Filed Weights   02/23/23 1225  Weight: 68 kg    Data Reviewed: I have personally reviewed and interpreted daily labs, tele strips, imagings as discussed above. I reviewed all nursing notes, pharmacy notes, vitals, pertinent old records I have discussed plan of care as described above with RN and patient/family.  CBC: Recent Labs  Lab 02/23/23 1225 02/25/23 0857  02/26/23 0616 02/27/23 0520  WBC 6.2 6.9 4.3 4.4  HGB 11.1* 11.4* 11.3* 11.1*  HCT 33.2* 32.2* 33.2* 31.2*  MCV 107.1* 100.9* 103.8* 102.3*  PLT 221 225 205 216   Basic Metabolic Panel: Recent Labs  Lab 02/23/23 1225 02/25/23 0857 02/26/23 0616 02/27/23 0520  NA 137 135 136 138  K 3.7 3.5 3.6 3.4*  CL 105 105 107 107  CO2 18* 21* 20* 21*  GLUCOSE 104* 76 75 78  BUN 24* 12 12 11   CREATININE 1.15* 0.87 0.93 0.91  CALCIUM 9.1 9.0 9.1 9.3  MG  --  1.9 2.0 1.9  PHOS  --  3.7 4.1 3.1    Studies: No results found.  Scheduled Meds:  baclofen  10 mg Oral BID   cyanocobalamin  1,000 mcg Intramuscular Daily   Followed by   Melene Muller ON 03/05/2023] cyanocobalamin  1,000 mcg Intramuscular Weekly   Followed by   Melene Muller ON 04/02/2023] cyanocobalamin  1,000 mcg Intramuscular Q30 days   divalproex  500 mg Oral Q12H   enoxaparin (LOVENOX) injection  40 mg Subcutaneous Q24H   gabapentin  100 mg Oral TID   lacosamide  200 mg Oral BID   levETIRAcetam  1,500 mg Oral BID   multivitamin with minerals  1 tablet Oral Daily   zonisamide  100 mg Oral BID   zonisamide  200 mg Oral QHS   Continuous Infusions:  cefTRIAXone (ROCEPHIN)  IV 1 g (02/26/23 1352)   dextrose 5 % and 0.9 % NaCl 75 mL/hr at 02/27/23 0549   PRN Meds: acetaminophen, bisacodyl, hydrALAZINE, LORazepam, LORazepam, morphine injection, [DISCONTINUED] ondansetron **OR** ondansetron (ZOFRAN) IV, mouth rinse, oxyCODONE  Time spent: 35 minutes  Author: Gillis Santa. MD [facility] 02/27/2023 1:29 PM  To reach On-call, see care teams to locate the attending and reach out to them via [email or web address]. If 7PM-7AM, please contact night-coverage If you still have difficulty reaching the attending provider, please page the [facility] (Director on Call) for [facility] on amion for assistance.

## 2023-02-27 NOTE — Progress Notes (Signed)
RN about to give oral meds, noticed patient having seizures, charge nurse notified,  rapid response called, MD notified, vitals taken, hooked to oxygen support. PRN ativan 2mg  IV given, seizure lasted about 2-3 minutes.

## 2023-02-27 NOTE — Progress Notes (Addendum)
Daily Progress Note   Patient Name: Katherine Fields       Date: 02/27/2023 DOB: November 28, 1965  Age: 57 y.o. MRN#: [id number] Attending Physician: Gillis Santa, MD Primary Care Physician: Sherol Dade, DO (Inactive) Admit Date: 02/23/2023  Reason for Consultation/Follow-up: Establishing goals of care  Subjective: Notes and labs reviewed.  In to see patient.  She is currently resting in bed with eyes closed, no distress noted at this time.  No family at bedside.  Called to speak with daughter [name].  Discussed updates.  Daughter states that they would be amenable to patient being transferred to a tertiary care facility for continuous EEG.  They state they would like the facility to be [facility] as she is only been to [facility] 1 time. Daughter advises that if patient will be moved to a tertiary care facility and not discharged back to LTC, they may want to revisit the plan for IVIG. She re-confirms they do not want a feeding tube for patient, and if it is clear over the coming days that she will not eat and drink enough to sustain herself, they would then look to discuss hospice level care.     Length of Stay: 3  Current Medications: Scheduled Meds:   baclofen  10 mg Oral BID   cyanocobalamin  1,000 mcg Intramuscular Daily   Followed by   Melene Muller ON 03/05/2023] cyanocobalamin  1,000 mcg Intramuscular Weekly   Followed by   Melene Muller ON 04/02/2023] cyanocobalamin  1,000 mcg Intramuscular Q30 days   divalproex  500 mg Oral Q12H   enoxaparin (LOVENOX) injection  40 mg Subcutaneous Q24H   gabapentin  100 mg Oral TID   lacosamide  200 mg Oral BID   levETIRAcetam  1,500 mg Oral BID   multivitamin with minerals  1 tablet Oral Daily   zonisamide  100 mg Oral BID   zonisamide  200 mg Oral QHS     Continuous Infusions:  cefTRIAXone (ROCEPHIN)  IV 1 g (02/26/23 1352)   dextrose 5 % and 0.9 % NaCl 75 mL/hr at 02/27/23 0549    PRN Meds: acetaminophen, bisacodyl, hydrALAZINE, LORazepam, LORazepam, morphine injection, [DISCONTINUED] ondansetron **OR** ondansetron (ZOFRAN) IV, mouth rinse, oxyCODONE  Physical Exam Constitutional:      Comments: Eyes closed.  No distress noted.  Pulmonary:  Effort: Pulmonary effort is normal.             Vital Signs: BP (!) 157/84 (BP Location: Right Arm)   Pulse (!) 133   Temp 98.2 F (36.8 C)   Resp 20   Ht 5\' 6"  (1.676 m)   Wt 68 kg   LMP  (LMP Unknown)   SpO2 100%   BMI 24.19 kg/m  SpO2: SpO2: 100 % O2 Device: O2 Device: Room Air O2 Flow Rate: O2 Flow Rate (L/min): 3 L/min  Intake/output summary:  Intake/Output Summary (Last 24 hours) at 02/27/2023 1348 Last data filed at 02/27/2023 0900 Gross per 24 hour  Intake 673.7 ml  Output 850 ml  Net -176.3 ml   LBM:   Baseline Weight: Weight: 68 kg Most recent weight: Weight: 68 kg   Patient Active Problem List   Diagnosis Date Noted   Paraplegia (HCC) 08/04/2022   Seizure disorder (HCC) 08/04/2022   Proctocolitis 08/02/2022   Urinary tract infection 08/01/2022   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   G tube feedings (HCC) 08/01/2022   Hypoglycemia 05/09/2022   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   Chronic pain 05/09/2022   UTI (urinary tract infection) 05/09/2022   Essential hypertension 05/09/2022   Functional quadriplegia (HCC) 05/09/2022   Epilepsy, generalized, convulsive (HCC)    Dysphagia    Seizures (HCC) 09/24/2016   Seizure (HCC) 03/14/2016   Dysphagia following cerebral infarction 10/18/2014   Hemiparesis affecting dominant side as late effect of cerebrovascular accident (HCC) 03/02/2014   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 [name] states family is amenable to patient being transferred to a  tertiary facility if needed for EEG monitoring.  They request that if she needs to be moved, it would be to [facility] as she is only been to [facility] 1 time. Daughter advises that if patient will be moved to a tertiary care facility and not discharged back to LTC, they may want to revisit the plan for IVIG. Daughter re-confirms their familiarity with feeding tubes and do not want a feeding tube placed.  They advise that if she does not eat enough in the coming days they would then look to hospice level care if needed. PMT will shadow as goals are set.  Please notify team for immediate needs.  Code Status:    Code Status Orders  (From admission, onward)           Start     Ordered   02/23/23 1520  Do not attempt resuscitation (DNR)- Limited -Do Not Intubate (DNI)  Continuous       Question Answer Comment  If pulseless and not breathing No CPR or chest compressions.   In Pre-Arrest Conditions (Patient Is Breathing and Has A Pulse) Do not intubate. Provide all appropriate non-invasive medical interventions. Avoid ICU transfer unless indicated or required.   Consent: Discussion documented in EHR or advanced directives reviewed      02/23/23 1520           Code Status History     Date Active Date Inactive Code Status Order ID Comments User Context   08/31/2022 0638 08/31/2022 1439 DNR [id number]  Delton Prairie, MD ED   08/01/2022 0407 08/04/2022 2322 DNR [id number]  Andris Baumann, MD Inpatient   05/09/2022 1413 05/10/2022 1529 DNR [id number]  Lucile Shutters, MD ED   09/24/2016 2035 09/30/2016 2336 Full Code [id number]  Adrian Saran, MD ED   03/14/2016  1537 03/14/2016 1545 Full Code [id number]  Milagros Loll, MD ED      Advance Directive Documentation    Flowsheet Row Most Recent Value  Type of Advance Directive Out of facility DNR (pink MOST or yellow form)  Pre-existing out of facility DNR order (yellow form or pink MOST form) Pink MOST form placed in chart (order not valid for inpatient use)   "MOST" Form in Place? --       Prognosis: Poor overall   Thank you for allowing the Palliative Medicine Team to assist in the care of this patient.   Morton Stall, NP  Please contact Palliative Medicine Team phone at [phone] for questions and concerns.

## 2023-02-27 NOTE — Progress Notes (Signed)
SLP Cancellation Note  Patient Details Name: Katherine Fields MRN: [id number] DOB: [birth year]   Cancelled treatment:       Reason Eval/Treat Not Completed:  (chart reviewed; consulted NSG/Palliative Care and MD re: pt's status.)  Pt continues to be monitored/tx'd for seizure activity. Per Palliative Care consultation w/ Family and MD, the Family endorsed to Palliative Care, "that at baseline their mother is on a pured diet with thin liquids.  They state recommendations for thickened liquids have been previously made, but patient does not like thickened liquids and will not drink them.  The daughters advise that they are aware of the risk for aspiration. They advise that the patient herself stated that she did not want to thicken liquids, and the family wants to preserve her wishes and quality of life.  They confirm DNR and DNI status.  They advise they would not want a feeding tube for patient as she has previously had a feeding tube and would not want this in the future.".    The current recommendation by ST services is for a Dysphagia level 1 diet w/ Nectar liquids; strict aspiration precautions. Will defer the decision to change pt to thin liquids per QOL choice to the MD and Family. No further skilled ST services indicated at this time. MD to reconsult if new needs aruse.      Jerilynn Som, MS, CCC-SLP Speech Language Pathologist Rehab Services; [facility] [phone] (ascom) Harika Laidlaw 02/27/2023, 12:39 PM

## 2023-02-27 NOTE — Progress Notes (Signed)
Rapid Response Event Note   Reason for Call : Seizure   Initial Focused Assessment: upon assessment patient appearing to be having seizure.  Left side with jerking.       Interventions: Patient placed on 100% NRM, sp02 monitor applied, BP checked, portable cardiac monitor already in place and was sinus tach 150's.  Patient with history of seizures. Patient had PRN order for ativan and ativan 2mg  IV given.    Plan of Care: Dr. Lucianne Muss at bedside to assess patient.  Plan for PO antiseizures medication to be changed to IV. Neurologist to be notified of seizure.      Event Summary: Patient remained in room 257.  MD Notified: 10:42 Call Time:10:39 Arrival Time:10:40 End Time:10:50  Kelby Fam, RN

## 2023-02-27 NOTE — TOC Progression Note (Signed)
Transition of Care [facility]) - Progression Note    Patient Details  Name: Katherine Fields MRN: [id number] Date of Birth: 1965-06-24  Transition of Care [facility]) CM/SW Contact  Hetty Ely, RN Phone Number: 02/27/2023, 2:04 PM  Clinical Narrative:  CM spoke with MD via secure chat about discharging back to [facility] facility. MD says patient had a seizure this morning and not medically stable to discharge. WOM Deborah notified.          Expected Discharge Plan and Services                                               Social Determinants of Health (SDOH) Interventions SDOH Screenings   Food Insecurity: Patient Unable To Answer (02/24/2023)  Housing: Patient Unable To Answer (02/24/2023)  Transportation Needs: Patient Unable To Answer (02/24/2023)  Utilities: Patient Unable To Answer (02/24/2023)  Financial Resource Strain: Low Risk  (07/28/2021)   Received from [facility], [facility]  Tobacco Use: Low Risk  (02/24/2023)    Readmission Risk Interventions     No data to display

## 2023-02-27 NOTE — Progress Notes (Signed)
   02/27/23 1200  Spiritual Encounters  Type of Visit Initial;Attempt (pt unavailable)  OnCall Visit Yes  Spiritual Care Plan  Spiritual Care Issues Still Outstanding Referring to oncoming chaplain for further support   Responded to code to patient room there was not family present. Was unable to see patient at that time because medical time was doing  their assessment. Will revisit the patient later.

## 2023-02-27 NOTE — Progress Notes (Signed)
Neurology Progress Note  Patient ID: Katherine Fields is a 57 y.o. with PMHx of  has a past medical history of Chronic pain, G tube feedings (HCC), ICH (intracerebral hemorrhage) (HCC), Protein calorie malnutrition (HCC), and Stiffman syndrome.  Subjective: - Possible seizure activity at 10 AM when meds were due  - Bilateral arm shaking, grunting, < 3 min, ativan given   Exam: Vitals:   02/27/23 1044 02/27/23 1049  BP: (!) 169/85 (!) 157/84  Pulse: (!) 138 (!) 133  Resp:  20  Temp:  98.2 F (36.8 C)  SpO2: 100% 100%   Gen: In bed not grunting, more  Resp: non-labored breathing, no grossly audible wheezing Cardiac: Perfusing extremities well  Abd: soft, nt   Witnessed event during my examination 9/28: Right upper extremity flexion contracture with a right head turn and right gaze deviation, repetitive dorsi and plantarflexion of both feet concurrently (unclear if this was suppressible as it resolved by the time I was able to touch her feet).  Event lasted less than 1 minute.  Postevent right facial droop  Neuro: MS: Awake, alert, not following any commands CN: No facial droop at this time, eyes move bilaterally spontaneously, does not clearly track. Spontaneous head movement side to side and extension Motor: Not following commands today. Some stiffing / raising up of LLE but suppressible. Sponatenous movement of LUE > RUE.   Does not open mouth for medications offered at lips   Pertinent Labs:  Basic Metabolic Panel: Recent Labs  Lab 02/23/23 1225 02/25/23 0857 02/26/23 0616 02/27/23 0520  NA 137 135 136 138  K 3.7 3.5 3.6 3.4*  CL 105 105 107 107  CO2 18* 21* 20* 21*  GLUCOSE 104* 76 75 78  BUN 24* 12 12 11   CREATININE 1.15* 0.87 0.93 0.91  CALCIUM 9.1 9.0 9.1 9.3  MG  --  1.9 2.0 1.9  PHOS  --  3.7 4.1 3.1    CBC: Recent Labs  Lab 02/23/23 1225 02/25/23 0857 02/26/23 0616 02/27/23 0520  WBC 6.2 6.9 4.3 4.4  HGB 11.1* 11.4* 11.3* 11.1*  HCT 33.2* 32.2*  33.2* 31.2*  MCV 107.1* 100.9* 103.8* 102.3*  PLT 221 225 205 216    Coagulation Studies: No results for input(s): "LABPROT", "INR" in the last 72 hours.    GAD65 Ab, Serum 3290 (H) <=0.02 nmol/L  11/27/2022 11:22 AM EDT    Assessment:   Unclear if today's event was epileptic or non epileptic, noting patient also has chart history of non-epileptic events  On 9/26 Lengthy discussion with the daughter [name] at the bedside as well as Sheara by phone.  We discussed how the event I witnessed was concerning for a focal seizure, but brief and self-limited.  At this time they feel that her seizure frequency is at recent baseline although it did increase in the last 1 to 2 months.  We reviewed risk/benefit of IVIG at length and they elected further discussion of this with outpatient specialists rather than proceeding with treatment in the hospital at this time.  They report they are not considering VNS placement at this time as they do not feel surgical interventions would be within goals of care, defer further discussion of this to outpatient neurologist as they do feel that her current dose of seizure medications is making her very sleepy (VNS placement at times can allow reduction of seizure medications and improvement in quality of life).   Discharge timing per primary team, discussed with family that Dr.  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 - Minimally verbal at baseline but able to follow commands and take PO   Recommendations: # Seizures  -Continue baclofen 10 mg twice daily (home dose) -Continue Vimpat 200 mg twice daily -Continue Depakote total daily dose of 1 g (500 mg BID PO, okay for one time 500 mg IV)  -STAT depakote level  -Continue Keppra 1500 twice daily -Continue Zonisamide 100 in the morning and 300 at night  (100 mg twice daily with additional 200 mg capsule nightly) -Continue gabapentin 100 mg 3 times daily -Given expense of intranasal midazolam, I have ordered Ativan solution to be given into the cheek, 2 mg up to 2 doses 5 minutes apart for seizure activity lasting greater than 5 minutes, this should be included in the discharge medications  # Goals of Care -Primary team to re-discuss with family  1) empiric IVIG due to increased frequency of events and stiffness on exam,  2) potential transfer to facility with continuous EEG monitoring for spell characterization (most likely [facility], could attempt to reach out to [facility] for transfer though unlikely to be successful due to bed availability) 3) clarification of goals of care if she continues to not take PO -Benzodiazepines (onfi, clonazepam) may be a good alternative to consider in the future for dual treatment of stiff person syndrome and seizures but defer this to outpatient neurologist -Discussed with nursing and Dr. Burna Cash MD-PhD [facility] [phone]

## 2023-02-28 DIAGNOSIS — R569 Unspecified convulsions: Secondary | ICD-10-CM | POA: Diagnosis not present

## 2023-02-28 LAB — CBC
HCT: 30.7 % — ABNORMAL LOW (ref 36.0–46.0)
Hemoglobin: 11 g/dL — ABNORMAL LOW (ref 12.0–15.0)
MCH: 36.7 pg — ABNORMAL HIGH (ref 26.0–34.0)
MCHC: 35.8 g/dL (ref 30.0–36.0)
MCV: 102.3 fL — ABNORMAL HIGH (ref 80.0–100.0)
Platelets: 212 10*3/uL (ref 150–400)
RBC: 3 MIL/uL — ABNORMAL LOW (ref 3.87–5.11)
RDW: 14.4 % (ref 11.5–15.5)
WBC: 4.7 10*3/uL (ref 4.0–10.5)
nRBC: 0 % (ref 0.0–0.2)

## 2023-02-28 LAB — BASIC METABOLIC PANEL
Anion gap: 8 (ref 5–15)
BUN: 8 mg/dL (ref 6–20)
CO2: 21 mmol/L — ABNORMAL LOW (ref 22–32)
Calcium: 9.4 mg/dL (ref 8.9–10.3)
Chloride: 111 mmol/L (ref 98–111)
Creatinine, Ser: 0.83 mg/dL (ref 0.44–1.00)
GFR, Estimated: >60 mL/min (ref >60)
Glucose, Bld: 83 mg/dL (ref 70–99)
Potassium: 3.4 mmol/L — ABNORMAL LOW (ref 3.5–5.1)
Sodium: 140 mmol/L (ref 135–145)

## 2023-02-28 LAB — MAGNESIUM: Magnesium: 2 mg/dL (ref 1.7–2.4)

## 2023-02-28 LAB — PHOSPHORUS: Phosphorus: 3.1 mg/dL (ref 2.5–4.6)

## 2023-02-28 MED ORDER — SODIUM CHLORIDE 0.9 % IV BOLUS
500.0000 mL | INTRAVENOUS | Status: AC
  Administered 2023-02-28 – 2023-03-04 (×5): 500 mL via INTRAVENOUS

## 2023-02-28 MED ORDER — SODIUM CHLORIDE 0.9 % IV BOLUS
250.0000 mL | INTRAVENOUS | Status: AC
  Administered 2023-02-28 – 2023-03-04 (×5): 250 mL via INTRAVENOUS

## 2023-02-28 MED ORDER — SODIUM CHLORIDE 0.9 % IV BOLUS: 500.0000 mL | INTRAVENOUS | Status: DC

## 2023-02-28 MED ORDER — SODIUM CHLORIDE 0.9 % IV BOLUS
250.0000 mL | INTRAVENOUS | Status: DC
Start: 2023-02-28 — End: 2023-02-28

## 2023-02-28 MED ORDER — IMMUNE GLOBULIN (HUMAN) 10 GM/100ML IV SOLN
400.0000 mg/kg | INTRAVENOUS | Status: AC
  Administered 2023-02-28 – 2023-03-04 (×5): 25 g via INTRAVENOUS
  Filled 2023-02-28 (×5): qty 250

## 2023-02-28 MED ORDER — LEVETIRACETAM IN NACL 1500 MG/100ML IV SOLN
1500.0000 mg | Freq: Two times a day (BID) | INTRAVENOUS | Status: DC
  Administered 2023-02-28 – 2023-03-05 (×10): 1500 mg via INTRAVENOUS
  Filled 2023-02-28 (×11): qty 100

## 2023-02-28 MED ORDER — SODIUM CHLORIDE 0.9 % IV SOLN
200.0000 mg | Freq: Two times a day (BID) | INTRAVENOUS | Status: DC
  Administered 2023-02-28 – 2023-03-05 (×10): 200 mg via INTRAVENOUS
  Filled 2023-02-28 (×11): qty 20

## 2023-02-28 MED ORDER — VALPROATE SODIUM 100 MG/ML IV SOLN
250.0000 mg | Freq: Four times a day (QID) | INTRAVENOUS | Status: DC
  Administered 2023-02-28 – 2023-03-05 (×21): 250 mg via INTRAVENOUS
  Filled 2023-02-28 (×26): qty 2.5

## 2023-02-28 NOTE — Progress Notes (Addendum)
[facility] Progress Note  Patient: Katherine Fields    ZOX:[id number]  DOA: 02/23/2023     Date of Service: the patient was seen and examined on 02/28/2023  Chief Complaint  Patient presents with   Seizures    History of seizures - Patient brought in by EMS from [facility] for seizures; Was given Ativan 1 mg IM by WOM, patient continued to have focal seizures, IV was established by EMS and an additional Versed 5 mg IV was administered; Patient is somnolent upon arrival but does respond to verbal stimuli, Pupils are equal, round and reactive   Brief hospital course: Katherine Fields is a 57 y.o. 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    Of note: recent outpatient neurology visit at [facility] last Friday 09/20, concern for elevated GAD Ab as cause for seizures, patient was referred to see neuro immunology clinic for possible intervention. Rx recs at that time: increasing Keppra from 1000 twice daily to 1250 twice daily;continue Depakote, Vimpat gabapentin and zonisamide same doses.      Hospital course / significant events:  09/23: received Ativan en route w/ EMS, postictal/sedated in ED. Labs unremarkable. CT head no acute findings. UA showed WBC 11-20. [facility] neurology physician was contacted by [facility] ED physician - they recommended increase Keppra to 1500 mg twice daily, Depakote 500 mg twice daily and zonisamide to 200 mg twice daily. Admitting hospitalist left messages to patient daughter.   09/24: seizure overnight per RN requiring IV ativan. This morning, daughters state she is about at her baseline. SLP eval - essentially no swallow function, switched all meds to IV for now, if not recovering may need to consider palliative consult tomorrow      Consultants:  EDP spoke w/  pt's outpatient neurologist, consulted Neuro Dr Iver Nestle ios following   Procedures/Surgeries: None    Assessment and Plan:  # Breakthrough seizure Given dysphagia, question not swallowing meds well might be a reason for breakthrough seizures?  EEG normal (post/inter-interictal) ED physician discussed case patient's neurology in [facility], who recommended continue current treatment. Neurology consulted, recommended following medications, patient received IV meds which were switched to oral and again switched to IV as patient is having intermittent seizures and unable to take oral meds 9/28 continue Keppra 1500 mg IV twice daily, Depacon 250 mg IV every 6 hourly, Vimpat 200 mg IV twice daily, continue zonisamide 100 mg p.o. twice daily and 200 mg p.o. nightly Continue gabapentin 100 mg 3 times daily and Baclofen 10 mg p.o. 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 but defer this to outpatient neurologist Continue IV Ativan as needed for seizures > 5 minutes duration Continue seizure precaution, Aspiration precaution SLP eval done and started pured diet S/p IVF for hydration, continue [facility] Palliative care consulted for goals of care discussion with family 9/28 discussed with patient's daughters, agreed for IV IgG Started IV IgG.  She Chane 25 g IV daily for 5 days along with pre and post NS for hydration to prevent AKI 9/28 as per neuro patient will benefit from LTM EEG monitoring, family requested to transfer to [facility], currently [facility] is at capacity and cannot keep the patient on the waiting list.  We will try to  call them again tomorrow a.m.   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  On pured diet at baseline, seen by SLP, started on pured diet again on 9/25    HTN BP initially low, held home lisinopril and metoprolol d/t cannot take po, hydralazine prn  Vitamin B12 deficiency: Started vitamin B12 1000 mcg IM injection.  Body mass index is 24.19 kg/m.  Nutrition Problem: Inadequate oral intake Etiology: dysphagia Interventions: Interventions: Magic cup, MVI   Diet: Pured DVT Prophylaxis: Subcutaneous Lovenox   Advance goals of care discussion: DNR/DNI and NO feeding Tube  Family Communication: family was not present at bedside, at the time of interview.  The pt provided permission to discuss medical plan with the family. Opportunity was given to ask question and all questions were answered satisfactorily.  9/28 discussed with patient's daughter over the phone  Disposition:  Pt is LTC resident from [facility], admitted with intractable seizures, still has seizure, which precludes a safe discharge. Started IVIG for 5 days total 03/04/23 Discharge to same facility, when cleared by Neurologist.  Subjective: No significant events overnight, patient was lying comfortably, without any discomfort.   Physical Exam: General: NAD, comfortable.   Eyes: PERRLA ENT: Oral Mucosa Clear, dry Neck: no JVD,  Cardiovascular: S1 and S2 Present, no Murmur,  Respiratory: good respiratory effort, Bilateral Air entry equal and Decreased, no Crackles, no wheezes Abdomen: Bowel Sound present, Soft and no tenderness,  Skin: no rashes Extremities: no Pedal edema, no calf tenderness Neurologic: Stiffness of all extremities due to stiff man syndrome Nonambulatory at baseline  Vitals:   02/28/23 1242 02/28/23 1432 02/28/23 1447 02/28/23 1502  BP: (!) 146/69 (!) 152/101 (!) 159/95 (!) 143/81   Pulse: 93 92 90 88  Resp: (!) 22 20 19    Temp: 98.2 F (36.8 C) 98.6 F (37 C) 98.5 F (36.9 C) 98.6 F (37 C)  TempSrc:  Axillary Axillary Axillary  SpO2: 100% 100% 100% 100%  Weight:      Height:        Intake/Output Summary (Last 24 hours) at 02/28/2023 1520 Last data filed at 02/28/2023 0841 Gross per 24 hour  Intake 757.71 ml  Output 725 ml  Net 32.71 ml   Filed Weights   02/23/23 1225  Weight: 68 kg    Data Reviewed: I have personally reviewed and interpreted daily labs, tele strips, imagings as discussed above. I reviewed all nursing notes, pharmacy notes, vitals, pertinent old records I have discussed plan of care as described above with RN and patient/family.  CBC: Recent Labs  Lab 02/23/23 1225 02/25/23 0857 02/26/23 0616 02/27/23 0520 02/28/23 0530  WBC 6.2 6.9 4.3 4.4 4.7  HGB 11.1* 11.4* 11.3* 11.1* 11.0*  HCT 33.2* 32.2* 33.2* 31.2* 30.7*  MCV 107.1* 100.9* 103.8* 102.3* 102.3*  PLT 221 225 205 216 212   Basic Metabolic Panel: Recent Labs  Lab 02/23/23 1225 02/25/23 0857 02/26/23 0616 02/27/23 0520 02/28/23 0530  NA 137 135 136 138 140  K 3.7 3.5 3.6 3.4* 3.4*  CL 105 105 107 107 111  CO2 18* 21* 20* 21* 21*  GLUCOSE 104*  76 75 78 83  BUN 24* 12 12 11 8   CREATININE 1.15* 0.87 0.93 0.91 0.83  CALCIUM 9.1 9.0 9.1 9.3 9.4  MG  --  1.9 2.0 1.9 2.0  PHOS  --  3.7 4.1 3.1 3.1    Studies: No results found.  Scheduled Meds:  baclofen  10 mg Oral BID   cyanocobalamin  1,000 mcg Intramuscular Daily   Followed by   Melene Muller ON 03/05/2023] cyanocobalamin  1,000 mcg Intramuscular Weekly   Followed by   Melene Muller ON 04/02/2023] cyanocobalamin  1,000 mcg Intramuscular Q30 days   enoxaparin (LOVENOX) injection  40 mg Subcutaneous Q24H   gabapentin  100 mg Oral TID   multivitamin with minerals  1 tablet Oral Daily   zonisamide  100 mg Oral BID   zonisamide  200 mg Oral QHS   Continuous Infusions:  dextrose 5 % and 0.9 % NaCl 75 mL/hr at 02/28/23  1154   Immune Globulin 10%     lacosamide (VIMPAT) IV     levETIRAcetam     sodium chloride 250 mL (02/28/23 1407)   And   sodium chloride     valproate sodium 250 mg (02/28/23 1437)   PRN Meds: acetaminophen, bisacodyl, hydrALAZINE, LORazepam, LORazepam, [DISCONTINUED] ondansetron **OR** ondansetron (ZOFRAN) IV, mouth rinse, oxyCODONE  Time spent: 60 minutes  Author: Gillis Santa. MD [facility] 02/28/2023 3:20 PM  To reach On-call, see care teams to locate the attending and reach out to them via [email or web address]. If 7PM-7AM, please contact night-coverage If you still have difficulty reaching the attending provider, please page the [facility] (Director on Call) for [facility] on amion for assistance.

## 2023-02-28 NOTE — Plan of Care (Signed)

## 2023-02-28 NOTE — Progress Notes (Signed)
Neurology Progress Note  Patient ID: Katherine Fields is a 57 y.o. with PMHx of  has a past medical history of Chronic pain, G tube feedings (HCC), ICH (intracerebral hemorrhage) (HCC), Protein calorie malnutrition (HCC), and Stiffman syndrome.  Subjective: -No further seizure activity but mental status continues to fluctuate with intermittent significant difficulty accepting p.o. medications   Exam: Vitals:   02/28/23 0500 02/28/23 0837  BP: (!) 148/98 (!) 153/92  Pulse:  96  Resp:  20  Temp:  98.6 F (37 C)  SpO2:  100%   Gen: In bed, poorly interactive Resp: non-labored breathing, no grossly audible wheezing Cardiac: Perfusing extremities well  Abd: soft, nt   Witnessed event during my examination 9/28: Right upper extremity flexion contracture with a right head turn and right gaze deviation, repetitive dorsi and plantarflexion of both feet concurrently (unclear if this was suppressible as it resolved by the time I was able to touch her feet).  Event lasted less than 1 minute.  Postevent right facial droop  Neuro: MS: Awake, alert, not following any commands today.  Not nodding head yes or no today.  Not clearly orienting to examiner today CN: No facial droop at this time, eyes move bilaterally spontaneously, does not clearly track.  Motor: Not following commands today.  Increased tone in all extremities.  Slight movement spontaneously left side more than right side  Does not open mouth for medications offered at lips at the time of my evaluation again today although took some medications with nursing earlier   Pertinent Labs:  Basic Metabolic Panel: Recent Labs  Lab 02/23/23 1225 02/25/23 0857 02/26/23 0616 02/27/23 0520 02/28/23 0530  NA 137 135 136 138 140  K 3.7 3.5 3.6 3.4* 3.4*  CL 105 105 107 107 111  CO2 18* 21* 20* 21* 21*  GLUCOSE 104* 76 75 78 83  BUN 24* 12 12 11 8   CREATININE 1.15* 0.87 0.93 0.91 0.83  CALCIUM 9.1 9.0 9.1 9.3 9.4  MG  --  1.9 2.0  1.9 2.0  PHOS  --  3.7 4.1 3.1 3.1    CBC: Recent Labs  Lab 02/23/23 1225 02/25/23 0857 02/26/23 0616 02/27/23 0520 02/28/23 0530  WBC 6.2 6.9 4.3 4.4 4.7  HGB 11.1* 11.4* 11.3* 11.1* 11.0*  HCT 33.2* 32.2* 33.2* 31.2* 30.7*  MCV 107.1* 100.9* 103.8* 102.3* 102.3*  PLT 221 225 205 216 212    Coagulation Studies: No results for input(s): "LABPROT", "INR" in the last 72 hours.   Valproic acid level 39 on admission, 38 9/27  GAD65 Ab, Serum 3290 (H) <=0.02 nmol/L  11/27/2022 11:22 AM EDT    Assessment:  57 year old with significant baseline debility but able to say some words at times and generally takes her medications when offered and shakes her head yes and no mostly correctly and follows commands with all 4 extremities. I am concerned that she is having increasing stiffness and reduced responsiveness over my serial examinations although she does seem to have significant fluctuations.  With her poor baseline mental status and documented concerns for epileptic as well as nonepileptic events, I would hesitate to further adjust his seizure medications without long-term EEG monitoring to clarify events.  At this time family is still considering whether they would be comfortable with transfer to [facility] as they do have a strong preference for [facility] given her other care is there  Her worsening may be related to delirium/progression of an underlying neurodegenerative condition but with her high  titer recent GAD 65 result I do think a trial of IVIG is appropriate and appreciate primary team's discussion with family obtaining consent for the same  Impression: - GAD antibody positive stiff person syndrome - Intractable epilepsy with seizure focus from prior left frontal ICH - Minimally verbal at baseline but able to follow commands and take PO typically  - Possible element of delirium - Possible UTI being treated by primary team  Recommendations: # Intractable focal  epilepsy Antiseizure medications available IV have been changed to IV while patient is also getting treated with IVIG (Vimpat, Depakote, Keppra) -Continue baclofen 10 mg twice daily (home dose) -Continue Vimpat 200 mg twice daily (1-1 conversion to PO when appropriate) -Continue Depakote total daily dose of 1 g (250 mg every 6 hours IV due to pharmacokinetics, convert to 500 mg BID PO when appropriate) -Continue Keppra 1500 twice daily (1-1 conversion IV if needed) -Continue Zonisamide 100 in the morning and 300 at night (100 mg twice daily with additional 200 mg capsule nightly; available p.o. only ) -Continue gabapentin 100 mg 3 times daily (available p.o. only) -Given expense of intranasal midazolam, I have ordered Ativan solution to be given into the cheek, 2 mg up to 2 doses 5 minutes apart for seizure activity lasting greater than 5 minutes, this should be included in the discharge medications.  Okay to use IV Ativan while in the hospital but would only give Ativan for an event lasting greater than 5 minutes and neurology should be notified if this medication is used -Recommend transfer for long-term EEG monitoring, which family is still considering.  If they consent to transfer and no bed is available at [facility] I will reach out to [facility] neurohospitalist to follow in consultation  # Stiff person syndrome with recent high titer GAD-65 antibody -IVIG 2 g/kg divided over 5 days  -250 cc normal saline prehydration and 500 cc normal saline post hydration for renal protection -Benzodiazepines (onfi, clonazepam or scheduled Ativan) may be a good alternative to consider in the future for dual treatment of stiff person syndrome, but will hold off for now due to complexity of her seizure medication regimen; consider rapid cross titration with one of her other seizure medications such as zonisamide once on long-term EEG -Discussed with nursing and Dr. Lucianne Muss via secure chat  Brooke Dare MD-PhD [facility] [phone]

## 2023-02-28 NOTE — Plan of Care (Signed)
  Problem: Clinical Measurements: Goal: Will remain free from infection Outcome: Progressing Goal: Diagnostic test results will improve Outcome: Progressing Goal: Respiratory complications will improve Outcome: Progressing Goal: Cardiovascular complication will be avoided Outcome: Progressing   Problem: Activity: Goal: Risk for activity intolerance will decrease Outcome: Progressing   Problem: Nutrition: Goal: Adequate nutrition will be maintained Outcome: Progressing   Problem: Elimination: Goal: Will not experience complications related to bowel motility Outcome: Progressing   Problem: Pain Managment: Goal: General experience of comfort will improve Outcome: Progressing   Problem: Safety: Goal: Ability to remain free from injury will improve Outcome: Progressing   Problem: Skin Integrity: Goal: Risk for impaired skin integrity will decrease Outcome: Progressing

## 2023-02-28 NOTE — Progress Notes (Signed)
Patient has CVA history, but it was not clear which site is weaker than the other. Patient is not following the commands which is hard to assess. Lt. Side was better move and Rt. Side was not move at all. Dr. Lucianne Muss was not sure CVA site was. Dr. Lucianne Muss was okay to place PIV access either arms. HS [employer]

## 2023-03-01 DIAGNOSIS — R569 Unspecified convulsions: Secondary | ICD-10-CM | POA: Diagnosis not present

## 2023-03-01 LAB — CBC
HCT: 30.2 % — ABNORMAL LOW (ref 36.0–46.0)
Hemoglobin: 10.6 g/dL — ABNORMAL LOW (ref 12.0–15.0)
MCH: 35.8 pg — ABNORMAL HIGH (ref 26.0–34.0)
MCHC: 35.1 g/dL (ref 30.0–36.0)
MCV: 102 fL — ABNORMAL HIGH (ref 80.0–100.0)
Platelets: 190 10*3/uL (ref 150–400)
RBC: 2.96 MIL/uL — ABNORMAL LOW (ref 3.87–5.11)
RDW: 14.5 % (ref 11.5–15.5)
WBC: 3.5 10*3/uL — ABNORMAL LOW (ref 4.0–10.5)
nRBC: 0 % (ref 0.0–0.2)

## 2023-03-01 LAB — BASIC METABOLIC PANEL
Anion gap: 8 (ref 5–15)
BUN: 8 mg/dL (ref 6–20)
CO2: 20 mmol/L — ABNORMAL LOW (ref 22–32)
Calcium: 8.8 mg/dL — ABNORMAL LOW (ref 8.9–10.3)
Chloride: 108 mmol/L (ref 98–111)
Creatinine, Ser: 0.77 mg/dL (ref 0.44–1.00)
GFR, Estimated: >60 mL/min (ref >60)
Glucose, Bld: 76 mg/dL (ref 70–99)
Potassium: 3.3 mmol/L — ABNORMAL LOW (ref 3.5–5.1)
Sodium: 136 mmol/L (ref 135–145)

## 2023-03-01 MED ORDER — POTASSIUM CHLORIDE 10 MEQ/100ML IV SOLN
10.0000 meq | INTRAVENOUS | Status: DC
  Administered 2023-03-01: 10 meq via INTRAVENOUS
  Filled 2023-03-01 (×4): qty 100

## 2023-03-01 MED ORDER — POTASSIUM CHLORIDE CRYS ER 20 MEQ PO TBCR
40.0000 meq | EXTENDED_RELEASE_TABLET | Freq: Once | ORAL | Status: AC
  Administered 2023-03-01: 40 meq via ORAL
  Filled 2023-03-01: qty 2

## 2023-03-01 NOTE — Progress Notes (Signed)
[facility] Progress Note  Patient: Katherine Fields    WUJ:[id number]  DOA: 02/23/2023     Date of Service: the patient was seen and examined on 03/01/2023  Chief Complaint  Patient presents with   Seizures    History of seizures - Patient brought in by EMS from [facility] for seizures; Was given Ativan 1 mg IM by WOM, patient continued to have focal seizures, IV was established by EMS and an additional Versed 5 mg IV was administered; Patient is somnolent upon arrival but does respond to verbal stimuli, Pupils are equal, round and reactive   Brief hospital course: Irasema Chalk is a 57 y.o. 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    Of note: recent outpatient neurology visit at [facility] last Friday 09/20, concern for elevated GAD Ab as cause for seizures, patient was referred to see neuro immunology clinic for possible intervention. Rx recs at that time: increasing Keppra from 1000 twice daily to 1250 twice daily;continue Depakote, Vimpat gabapentin and zonisamide same doses.      Hospital course / significant events:  09/23: received Ativan en route w/ EMS, postictal/sedated in ED. Labs unremarkable. CT head no acute findings. UA showed WBC 11-20. [facility] neurology physician was contacted by [facility] ED physician - they recommended increase Keppra to 1500 mg twice daily, Depakote 500 mg twice daily and zonisamide to 200 mg twice daily. Admitting hospitalist left messages to patient daughter.   09/24: seizure overnight per RN requiring IV ativan. This morning, daughters state she is about at her baseline. SLP eval - essentially no swallow function, switched all meds to IV for now, if not recovering may need to consider palliative consult tomorrow      Consultants:  EDP spoke w/  pt's outpatient neurologist, consulted Neuro Dr Iver Nestle ios following   Procedures/Surgeries: None    Assessment and Plan:  # Breakthrough seizure Given dysphagia, question not swallowing meds well might be a reason for breakthrough seizures?  EEG normal (post/inter-interictal) ED physician discussed case patient's neurology in [facility], who recommended continue current treatment. Neurology consulted, recommended following medications, patient received IV meds which were switched to oral and again switched to IV as patient is having intermittent seizures and unable to take oral meds 9/28 continue Keppra 1500 mg IV twice daily, Depacon 250 mg IV every 6 hourly, Vimpat 200 mg IV twice daily, continue zonisamide 100 mg p.o. twice daily and 200 mg p.o. nightly Continue gabapentin 100 mg 3 times daily and Baclofen 10 mg p.o. 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 but defer this to outpatient neurologist Continue IV Ativan as needed for seizures > 5 minutes duration Continue seizure precaution, Aspiration precaution SLP eval done and started pured diet S/p IVF for hydration, continue [facility] Palliative care consulted for goals of care discussion with family 9/28 discussed with patient's daughters, agreed for IV IgG Started IV IgG.  She Chane 25 g IV daily for 5 days along with pre and post NS for hydration to prevent AKI 9/28 as per neuro patient will benefit from LTM EEG monitoring, family requested to transfer to [facility], currently [facility] is at capacity and cannot keep the patient on the waiting list.  9/29 d/w neurologist, recommended  to continue IVIG for 5 days, if she has recurrent seizures then transferred to tertiary care center for LTM EEG monitoring otherwise we can try to manage here. 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  On pured diet at baseline, seen by SLP, started on pured diet again on 9/25    HTN BP initially low, held home lisinopril and metoprolol d/t cannot take po, hydralazine prn  Vitamin B12 deficiency: Started vitamin B12 1000 mcg IM injection.  Body mass index is 24.19 kg/m.  Nutrition Problem: Inadequate oral intake Etiology: dysphagia Interventions: Interventions: Magic cup, MVI   Diet: Pured DVT Prophylaxis: Subcutaneous Lovenox   Advance goals of care discussion: DNR/DNI and NO feeding Tube  Family Communication: family was not present at bedside, at the time of interview.  The pt provided permission to discuss medical plan with the family. Opportunity was given to ask question and all questions were answered satisfactorily.  9/28 discussed with patient's daughter over the phone  Disposition:  Pt is LTC resident from [facility], admitted with intractable seizures, still has seizure, which precludes a safe discharge. Started IVIG for 5 days total 03/04/23 Discharge to same facility, when cleared by Neurologist.  Subjective: No significant events overnight, patient was lying comfortably, without any discomfort. As per RN patient is responding a little bit, squeezing left hand, has a right hand weakness.  Physical Exam: General: NAD, comfortable.   Eyes: PERRLA ENT: Oral Mucosa Clear, dry Neck: no JVD,  Cardiovascular: S1 and S2 Present, no Murmur,  Respiratory: good respiratory effort, Bilateral Air entry equal and Decreased, no Crackles, no wheezes Abdomen:  Bowel Sound present, Soft and no tenderness,  Skin: no rashes Extremities: no Pedal edema, no calf tenderness Neurologic: Stiffness of all extremities due to stiff man syndrome Nonambulatory at baseline  Vitals:   03/01/23 1216 03/01/23 1434 03/01/23 1459 03/01/23 1516  BP: (!) 180/93 (!) 154/81 (!) 143/91 (!) 140/83  Pulse: 81 71 75 73  Resp: 20 19 20 19   Temp: (!) 97.4 F (36.3 C) 98.7 F (37.1 C) 98.7 F (37.1 C) 98.3 F (36.8 C)  TempSrc: Oral Axillary Axillary Axillary  SpO2: 100% 100% 100% 100%  Weight:      Height:        Intake/Output Summary (Last 24 hours) at 03/01/2023 1530 Last data filed at 03/01/2023 1100 Gross per 24 hour  Intake 2886.51 ml  Output 1300 ml  Net 1586.51 ml   Filed Weights   02/23/23 1225  Weight: 68 kg    Data Reviewed: I have personally reviewed and interpreted daily labs, tele strips, imagings as discussed above. I reviewed all nursing notes, pharmacy notes, vitals, pertinent old records I have discussed plan of care as described above with RN and patient/family.  CBC: Recent Labs  Lab 02/25/23 0857 02/26/23 0616 02/27/23 0520 02/28/23 0530 03/01/23 0605  WBC 6.9 4.3 4.4 4.7 3.5*  HGB 11.4* 11.3* 11.1* 11.0* 10.6*  HCT 32.2* 33.2* 31.2* 30.7* 30.2*  MCV 100.9* 103.8* 102.3* 102.3* 102.0*  PLT 225 205 216  212 190   Basic Metabolic Panel: Recent Labs  Lab 02/25/23 0857 02/26/23 0616 02/27/23 0520 02/28/23 0530 03/01/23 0605  NA 135 136 138 140 136  K 3.5 3.6 3.4* 3.4* 3.3*  CL 105 107 107 111 108  CO2 21* 20* 21* 21* 20*  GLUCOSE 76 75 78 83 76  BUN 12 12 11 8 8   CREATININE 0.87 0.93 0.91 0.83 0.77  CALCIUM 9.0 9.1 9.3 9.4 8.8*  MG 1.9 2.0 1.9 2.0  --   PHOS 3.7 4.1 3.1 3.1  --     Studies: No results found.  Scheduled Meds:  baclofen  10 mg Oral BID   cyanocobalamin  1,000 mcg Intramuscular Daily   Followed by   Melene Muller ON 03/05/2023] cyanocobalamin  1,000 mcg Intramuscular Weekly   Followed by   Melene Muller ON  04/02/2023] cyanocobalamin  1,000 mcg Intramuscular Q30 days   enoxaparin (LOVENOX) injection  40 mg Subcutaneous Q24H   gabapentin  100 mg Oral TID   multivitamin with minerals  1 tablet Oral Daily   zonisamide  100 mg Oral BID   zonisamide  200 mg Oral QHS   Continuous Infusions:  dextrose 5 % and 0.9 % NaCl Stopped (03/01/23 1454)   Immune Globulin 10%     lacosamide (VIMPAT) IV 200 mg (03/01/23 1610)   levETIRAcetam 1,500 mg (03/01/23 0914)   sodium chloride 250 mL (03/01/23 1328)   And   sodium chloride 10 mL/hr at 02/28/23 2208   valproate sodium 250 mg (03/01/23 1128)   PRN Meds: acetaminophen, bisacodyl, hydrALAZINE, LORazepam, LORazepam, [DISCONTINUED] ondansetron **OR** ondansetron (ZOFRAN) IV, mouth rinse, oxyCODONE  Time spent: 40 minutes  Author: Gillis Santa. MD [facility] 03/01/2023 3:30 PM  To reach On-call, see care teams to locate the attending and reach out to them via [email or web address]. If 7PM-7AM, please contact night-coverage If you still have difficulty reaching the attending provider, please page the [facility] (Director on Call) for [facility] on amion for assistance.

## 2023-03-01 NOTE — Plan of Care (Signed)

## 2023-03-01 NOTE — Progress Notes (Signed)
Neurology Progress Note  Subjective: Seen and examined around 12:30 PM.  No family at bedside.  No further seizure activity but mental status continues to fluctuate with intermittent significant difficulty accepting p.o. medications   Exam: Vitals:   03/01/23 0821 03/01/23 1216  BP: (!) 153/92 (!) 180/93  Pulse: 80 81  Resp: 17 20  Temp: (!) 96.4 F (35.8 C) (!) 97.4 F (36.3 C)  SpO2: 100% 100%   Gen: In bed, eyes open, inconsistent tracking  Resp: non-labored breathing, no grossly audible wheezing Cardiac: Perfusing extremities well  Abd: soft, nt Neurological exam Eyes open Inconsistently tracks examiner Pupils equal round reactive to light Does not follow commands On consistently asking her questions, she starts to moan. Cranial nerves: Pupils equal round react light, extraocular movements difficult to assess, seems to be preferring the right but also looks to the left some.  Blinks to threat from both sides consistently. Motor examination: No spontaneous movement.  Not following commands.  Increased tone in all extremities.  Some grimace and withdrawal on the left, grimaces to noxious stimulation on the right with minimal withdrawal.  Pertinent Labs:  Basic Metabolic Panel: Recent Labs  Lab 02/25/23 0857 02/26/23 0616 02/27/23 0520 02/28/23 0530 03/01/23 0605  NA 135 136 138 140 136  K 3.5 3.6 3.4* 3.4* 3.3*  CL 105 107 107 111 108  CO2 21* 20* 21* 21* 20*  GLUCOSE 76 75 78 83 76  BUN 12 12 11 8 8   CREATININE 0.87 0.93 0.91 0.83 0.77  CALCIUM 9.0 9.1 9.3 9.4 8.8*  MG 1.9 2.0 1.9 2.0  --   PHOS 3.7 4.1 3.1 3.1  --     CBC: Recent Labs  Lab 02/25/23 0857 02/26/23 0616 02/27/23 0520 02/28/23 0530 03/01/23 0605  WBC 6.9 4.3 4.4 4.7 3.5*  HGB 11.4* 11.3* 11.1* 11.0* 10.6*  HCT 32.2* 33.2* 31.2* 30.7* 30.2*  MCV 100.9* 103.8* 102.3* 102.3* 102.0*  PLT 225 205 216 212 190    GAD65 Ab, Serum 3290 (H) <=0.02 nmol/L  11/27/2022 11:22 AM EDT      Assessment:  57 year old with significant baseline debility due to ICH and stiff person syndrome as well as seizures post ICH with worsening mentation. Prior to me taking over service, my colleague had witnessed some serial deterioration in her ability to stay awake and responsiveness-with the caveat that at baseline she is only able to track people and is nonverbal. She had multiple episodes concerning for seizures.  She is on multiple antiseizure medications already. Discussion about LTM EEG was had with family but they would want that to be done at [facility]. At this time, she has been started on IVIG with the concern that the overall worsening might be due to the worsening levels of the gad 65 antibodies and the stiff person syndrome might be causing part of the presentation. 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 medications typically at home prior to presentation. -Possible delirium in the setting of acute infection -Possible UTI-being treated by the primary team  Recommendations: -Continue antiseizure medications at the dose currently being used.  -Vimpat 200 twice daily -Depakote total dose of  1 g-to 50 mg every 6 hours.  When able to take p.o., convert to 500 mg twice daily.  -Continue Keppra 1500 twice daily  -Continue Zonegran 100 mg in the morning and 300 mg at night.  -Continue gabapentin 100 mg 3 times a day. -As needed IV Ativan for seizure activity more than 5 minutes.  For home, intranasal midazolam was cost prohibitive.  Dr. Iver Nestle has recommended Ativan solution. -If she has any further seizure  activity, would recommend urgent transfer to a tertiary care center with LTM facilities. -Started IVIG yesterday--complete 5 doses as advised. -Management of of toxic metabolic derangements per primary team.   Neurology will follow. Discussed with nursing and Dr. Lucianne Muss via secure chat   -- Milon Dikes, MD Neurologist [facility] Pager: [phone]

## 2023-03-02 DIAGNOSIS — R569 Unspecified convulsions: Secondary | ICD-10-CM | POA: Diagnosis not present

## 2023-03-02 LAB — CBC WITH DIFFERENTIAL/PLATELET
Abs Immature Granulocytes: 0.01 10*3/uL (ref 0.00–0.07)
Basophils Absolute: 0 10*3/uL (ref 0.0–0.1)
Basophils Relative: 0 %
Eosinophils Absolute: 0 10*3/uL (ref 0.0–0.5)
Eosinophils Relative: 0 %
HCT: 33.4 % — ABNORMAL LOW (ref 36.0–46.0)
Hemoglobin: 11.5 g/dL — ABNORMAL LOW (ref 12.0–15.0)
Immature Granulocytes: 0 %
Lymphocytes Relative: 24 %
Lymphs Abs: 0.7 10*3/uL (ref 0.7–4.0)
MCH: 35.5 pg — ABNORMAL HIGH (ref 26.0–34.0)
MCHC: 34.4 g/dL (ref 30.0–36.0)
MCV: 103.1 fL — ABNORMAL HIGH (ref 80.0–100.0)
Monocytes Absolute: 0.3 10*3/uL (ref 0.1–1.0)
Monocytes Relative: 9 %
Neutro Abs: 2.1 10*3/uL (ref 1.7–7.7)
Neutrophils Relative %: 67 %
Platelets: 214 10*3/uL (ref 150–400)
RBC: 3.24 MIL/uL — ABNORMAL LOW (ref 3.87–5.11)
RDW: 14.7 % (ref 11.5–15.5)
WBC: 3.1 10*3/uL — ABNORMAL LOW (ref 4.0–10.5)
nRBC: 0 % (ref 0.0–0.2)

## 2023-03-02 LAB — BASIC METABOLIC PANEL
Anion gap: 9 (ref 5–15)
BUN: 11 mg/dL (ref 6–20)
CO2: 19 mmol/L — ABNORMAL LOW (ref 22–32)
Calcium: 9.1 mg/dL (ref 8.9–10.3)
Chloride: 109 mmol/L (ref 98–111)
Creatinine, Ser: 0.88 mg/dL (ref 0.44–1.00)
GFR, Estimated: >60 mL/min (ref >60)
Glucose, Bld: 78 mg/dL (ref 70–99)
Potassium: 3.4 mmol/L — ABNORMAL LOW (ref 3.5–5.1)
Sodium: 137 mmol/L (ref 135–145)

## 2023-03-02 LAB — PHOSPHORUS: Phosphorus: 2.8 mg/dL (ref 2.5–4.6)

## 2023-03-02 LAB — MAGNESIUM: Magnesium: 1.8 mg/dL (ref 1.7–2.4)

## 2023-03-02 MED ORDER — HYDRALAZINE HCL 20 MG/ML IJ SOLN: 5.0000 mg | INTRAMUSCULAR | Status: DC | PRN

## 2023-03-02 MED ORDER — SODIUM BICARBONATE 650 MG PO TABS
650.0000 mg | ORAL_TABLET | Freq: Three times a day (TID) | ORAL | Status: DC
  Administered 2023-03-02: 650 mg via ORAL
  Filled 2023-03-02: qty 1

## 2023-03-02 MED ORDER — GABAPENTIN 250 MG/5ML PO SOLN
100.0000 mg | Freq: Three times a day (TID) | ORAL | Status: DC
  Administered 2023-03-03 – 2023-03-04 (×4): 100 mg via ORAL
  Filled 2023-03-02 (×11): qty 2

## 2023-03-02 MED ORDER — POTASSIUM CHLORIDE CRYS ER 20 MEQ PO TBCR
40.0000 meq | EXTENDED_RELEASE_TABLET | Freq: Once | ORAL | Status: AC
  Administered 2023-03-02: 40 meq via ORAL
  Filled 2023-03-02: qty 2

## 2023-03-02 NOTE — Procedures (Signed)
Patient Name: Katherine Fields  MRN: [id number]  Epilepsy Attending: Charlsie Quest  Referring Physician/Provider: Rejeana Brock, MD  Date: 03/02/2023  Duration: 23.44 mins  Patient history: 57yo f with ams getting eeg to evaluate for seizure  Level of alertness: Awake  AEDs during EEG study: LEV, LCM, VP, ZNS, GBP  Technical aspects: This EEG study was done with scalp electrodes positioned according to the 10-20 International system of electrode placement. Electrical activity was reviewed with band pass filter of 1-70Hz , sensitivity of 7 uV/mm, display speed of 7mm/sec with a 60Hz  notched filter applied as appropriate. EEG data were recorded continuously and digitally stored.  Video monitoring was available and reviewed as appropriate.  Description: The posterior dominant rhythm consists of 8 Hz activity of moderate voltage (25-35 uV) seen predominantly in posterior head regions, symmetric and reactive to eye opening and eye closing. EEG showed intermittent generalized 5 to 7 Hz theta slowing. Physiologic photic driving was not seen during photic stimulation.  Hyperventilation was not performed.     ABNORMALITY - Intermittent slow, generalized  IMPRESSION: This study is suggestive of mild diffuse encephalopathy. No seizures or epileptiform discharges were seen throughout the recording.  Katherine Fields

## 2023-03-02 NOTE — Progress Notes (Signed)
Attempted to give patient PO medication. Patient did not have the ability to swallow medication that was crushed and dissolved in applesauce. She let most of the applesauce run out of her mouth. She did not want to swallow the medication and acted like she did not know what to do with the applesauce once it was placed in her mouth.I notified Dr. Lucianne Muss that I was unable to get patient to take PO medication and that I would not attempt to give her any more PO medication. I also notified my director Prudy Feeler of this as well. Patient is having difficulty swallowing PO medication and I have documented this in the [facility]. I did continue to give IV medications as ordered.

## 2023-03-02 NOTE — Progress Notes (Signed)
[facility] Progress Note  Patient: Katherine Fields    ZOX:[id number]  DOA: 02/23/2023     Date of Service: the patient was seen and examined on 03/02/2023  Chief Complaint  Patient presents with   Seizures    History of seizures - Patient brought in by EMS from [facility] for seizures; Was given Ativan 1 mg IM by WOM, patient continued to have focal seizures, IV was established by EMS and an additional Versed 5 mg IV was administered; Patient is somnolent upon arrival but does respond to verbal stimuli, Pupils are equal, round and reactive   Brief hospital course: Katherine Fields is a 57 y.o. 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    Of note: recent outpatient neurology visit at [facility] last Friday 09/20, concern for elevated GAD Ab as cause for seizures, patient was referred to see neuro immunology clinic for possible intervention. Rx recs at that time: increasing Keppra from 1000 twice daily to 1250 twice daily;continue Depakote, Vimpat gabapentin and zonisamide same doses.      Hospital course / significant events:  09/23: received Ativan en route w/ EMS, postictal/sedated in ED. Labs unremarkable. CT head no acute findings. UA showed WBC 11-20. [facility] neurology physician was contacted by [facility] ED physician - they recommended increase Keppra to 1500 mg twice daily, Depakote 500 mg twice daily and zonisamide to 200 mg twice daily. Admitting hospitalist left messages to patient daughter.   09/24: seizure overnight per RN requiring IV ativan. This morning, daughters state she is about at her baseline. SLP eval - essentially no swallow function, switched all meds to IV for now, if not recovering may need to consider palliative consult tomorrow      Consultants:  EDP spoke w/  pt's outpatient neurologist, consulted Neuro Dr Iver Nestle was following   Procedures/Surgeries: None    Assessment and Plan:  # Breakthrough seizure Given dysphagia, question not swallowing meds well might be a reason for breakthrough seizures?  EEG normal (post/inter-interictal) ED physician discussed case patient's neurology in [facility], who recommended continue current treatment. Neurology consulted, recommended following medications, patient received IV meds which were switched to oral and again switched to IV as patient is having intermittent seizures and unable to take oral meds 9/28 continue Keppra 1500 mg IV twice daily, Depacon 250 mg IV every 6 hourly, Vimpat 200 mg IV twice daily, continue zonisamide 100 mg p.o. twice daily and 200 mg p.o. nightly Continue gabapentin 100 mg 3 times daily and Baclofen 10 mg p.o. 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  9/29 d/w neurologist, recommended to continue IVIG for 5 days, if she has recurrent seizures then transferred to tertiary care center for LTM EEG monitoring otherwise we can try to manage here. If no seizures then patient  can be discharged to long-term care facility after IVIG 9/30 patient is a reluctant to take oral medications, we will continue IV meds and IVIG.  If no improvement then palliative care will be consulted again to discuss goals of care for possible hospice care.  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  On pured diet at baseline, seen by SLP, started on pured diet again on 9/25    HTN BP initially low, held home lisinopril and metoprolol d/t cannot take po, hydralazine prn  Vitamin B12 deficiency: Started vitamin B12 1000 mcg IM injection.  Body mass index is 21.56 kg/m.  Nutrition Problem: Inadequate oral intake Etiology: dysphagia Interventions: Interventions: Magic cup, MVI   Diet: Pured DVT Prophylaxis: Subcutaneous Lovenox   Advance goals of care discussion: DNR/DNI and NO feeding Tube  Family Communication: family was not present at bedside, at the time of interview.  The pt provided permission to discuss medical plan with the family. Opportunity was given to ask question and all questions were answered satisfactorily.  9/28 discussed with patient's daughter over the phone  Disposition:  Pt is LTC resident from [facility], admitted with intractable seizures, still has seizure, which precludes a safe discharge. Started IVIG for 5 days total, till 03/04/23 Discharge to same facility, when cleared by Neurologist.  Subjective: No significant events overnight, patient was lying comfortably without any discomfort. Patient is nodding her head sometimes and trying to answer  questions but unable to comprehend.  Patient is trying to follow commands, she did lift her both hands to squeeze my fingers, power is good in the left arm than the right.  Patient was able to open her mouth as well.   Physical Exam: General: NAD, comfortable.   Eyes: PERRLA ENT: Oral Mucosa Clear, dry Neck: no JVD,  Cardiovascular: S1 and S2 Present, no Murmur,  Respiratory: good respiratory effort, Bilateral Air entry equal and Decreased, no Crackles, no wheezes Abdomen: Bowel Sound present, Soft and no tenderness,  Skin: no rashes Extremities: no Pedal edema, no calf tenderness Neurologic: Stiffness of lower extremities, right arm power 3/5, left arm power 4/5 Nonambulatory at baseline  Vitals:   03/02/23 0811 03/02/23 0853 03/02/23 0900 03/02/23 1100  BP: (!) 143/82   126/72  Pulse: 90     Resp: 19     Temp: (!) 96.4 F (35.8 C) (!) 79.1 F (26.2 C) 97.6 F (36.4 C) 97.8 F (36.6 C)  TempSrc:  Rectal Axillary Axillary  SpO2: 100%     Weight:      Height:        Intake/Output Summary (Last 24 hours) at 03/02/2023 1507 Last data filed at 03/02/2023 0634 Gross per 24 hour  Intake 944.78 ml  Output 1650 ml  Net -705.22 ml   Filed Weights   02/23/23 1225 03/02/23 0351  Weight: 68 kg 60.6 kg  Data Reviewed: I have personally reviewed and interpreted daily labs, tele strips, imagings as discussed above. I reviewed all nursing notes, pharmacy notes, vitals, pertinent old records I have discussed plan of care as described above with RN and patient/family.  CBC: Recent Labs  Lab 02/26/23 0616 02/27/23 0520 02/28/23 0530 03/01/23 0605 03/02/23 0508  WBC 4.3 4.4 4.7 3.5* 3.1*  NEUTROABS  --   --   --   --  2.1  HGB 11.3* 11.1* 11.0* 10.6* 11.5*  HCT 33.2* 31.2* 30.7* 30.2* 33.4*  MCV 103.8* 102.3* 102.3* 102.0* 103.1*  PLT 205 216 212 190 214   Basic Metabolic Panel: Recent Labs  Lab 02/25/23 0857 02/26/23 0616 02/27/23 0520 02/28/23 0530 03/01/23 0605  03/02/23 0508  NA 135 136 138 140 136 137  K 3.5 3.6 3.4* 3.4* 3.3* 3.4*  CL 105 107 107 111 108 109  CO2 21* 20* 21* 21* 20* 19*  GLUCOSE 76 75 78 83 76 78  BUN 12 12 11 8 8 11   CREATININE 0.87 0.93 0.91 0.83 0.77 0.88  CALCIUM 9.0 9.1 9.3 9.4 8.8* 9.1  MG 1.9 2.0 1.9 2.0  --  1.8  PHOS 3.7 4.1 3.1 3.1  --  2.8    Studies: No results found.  Scheduled Meds:  baclofen  10 mg Oral BID   cyanocobalamin  1,000 mcg Intramuscular Daily   Followed by   Melene Muller ON 03/05/2023] cyanocobalamin  1,000 mcg Intramuscular Weekly   Followed by   Melene Muller ON 04/02/2023] cyanocobalamin  1,000 mcg Intramuscular Q30 days   enoxaparin (LOVENOX) injection  40 mg Subcutaneous Q24H   gabapentin  100 mg Oral Q8H   multivitamin with minerals  1 tablet Oral Daily   sodium bicarbonate  650 mg Oral TID   zonisamide  100 mg Oral BID   zonisamide  200 mg Oral QHS   Continuous Infusions:  dextrose 5 % and 0.9 % NaCl 20 mL/hr at 03/02/23 0634   Immune Globulin 10%     lacosamide (VIMPAT) IV 200 mg (03/02/23 0955)   levETIRAcetam 1,500 mg (03/02/23 1040)   sodium chloride 250 mL (03/02/23 1439)   And   sodium chloride 500 mL (03/01/23 1736)   valproate sodium 250 mg (03/02/23 1303)   PRN Meds: acetaminophen, bisacodyl, hydrALAZINE, LORazepam, LORazepam, [DISCONTINUED] ondansetron **OR** ondansetron (ZOFRAN) IV, mouth rinse, oxyCODONE  Time spent: 40 minutes  Author: Gillis Santa. MD [facility] 03/02/2023 3:07 PM  To reach On-call, see care teams to locate the attending and reach out to them via [email or web address]. If 7PM-7AM, please contact night-coverage If you still have difficulty reaching the attending provider, please page the [facility] (Director on Call) for [facility] on amion for assistance.

## 2023-03-02 NOTE — Progress Notes (Signed)
CROSS COVER NOTE  NAME: Katherine Fields MRN: [id number] DOB : 10/02/1965    Concern as stated by nurse / staff   Seizure while receiving IVIG, aborted with Ativan not tolerating ivig. at shift change, BP up into 190's, HR 90's, lowered back down to 60/mg/kg/hr. tolerated well for a little bit, now 192/147 w/ hr in the 130's. paused ivig at the moment, please advise. thank you!      Pertinent findings on chart review: Last progress note reviewed.  Patient with history of stroke and stiff man syndrome, admitted for breakthrough seizure. On 929  Med review: Keppra 1500 mg IV twice daily, Depacon 250 mg IV every 6 hourly, Vimpat 200 mg IV twice daily, continue zonisamide 100 mg p.o. twice daily and 200 mg p.o. nightly Continue gabapentin 100 mg 3 times daily and Baclofen 10 mg p.o. twice daily  Per 9/29 d/w neurologist, recommended to continue IVIG for 5 days, if she has recurrent seizures then transferred to tertiary care center for LTM EEG monitoring otherwise we can try to manage here. If no seizures then patient can be discharged to long-term care facility after IVIG 9/30 patient is a reluctant to take oral medications, we will continue IV meds and IVIG.  If no improvement then palliative care will be consulted again to discuss goals of care for possible hospice care.    Assessment and  Interventions   Assessment:  Repeat breakthrough seizures while on IVIG  Plan: X X X

## 2023-03-02 NOTE — Progress Notes (Signed)
Neurology Progress Note  Subjective: No significant changes   Exam: Vitals:   03/02/23 0900 03/02/23 1100  BP:  126/72  Pulse:    Resp:    Temp: 97.6 F (36.4 C) 97.8 F (36.6 C)  SpO2:     Gen: In bed, eyes open Resp: non-labored breathing, no grossly audible wheezing  Neurological exam Eyes open Inconsistently tracks examiner Pupils equal round reactive to light She follows command to wiggle her thumb as well as wiggle her toes. Cranial nerves: Pupils equal round react light, extraocular movements difficult to assess, seems to be preferring the right but also looks to the left some.  Blinks to threat from both sides consistently. Motor examination: Increased tone in all extremities.  She wiggles toes and shows thumb on the right side to command, moves left side to noxious stimulation.  Pertinent Labs:  Basic Metabolic Panel: Recent Labs  Lab 02/25/23 0857 02/26/23 0616 02/27/23 0520 02/28/23 0530 03/01/23 0605 03/02/23 0508  NA 135 136 138 140 136 137  K 3.5 3.6 3.4* 3.4* 3.3* 3.4*  CL 105 107 107 111 108 109  CO2 21* 20* 21* 21* 20* 19*  GLUCOSE 76 75 78 83 76 78  BUN 12 12 11 8 8 11   CREATININE 0.87 0.93 0.91 0.83 0.77 0.88  CALCIUM 9.0 9.1 9.3 9.4 8.8* 9.1  MG 1.9 2.0 1.9 2.0  --  1.8  PHOS 3.7 4.1 3.1 3.1  --  2.8    CBC: Recent Labs  Lab 02/26/23 0616 02/27/23 0520 02/28/23 0530 03/01/23 0605 03/02/23 0508  WBC 4.3 4.4 4.7 3.5* 3.1*  NEUTROABS  --   --   --   --  2.1  HGB 11.3* 11.1* 11.0* 10.6* 11.5*  HCT 33.2* 31.2* 30.7* 30.2* 33.4*  MCV 103.8* 102.3* 102.3* 102.0* 103.1*  PLT 205 216 212 190 214    GAD65 Ab, Serum 3290 (H) <=0.02 nmol/L  11/27/2022 11:22 AM EDT     Assessment:  57 year old with significant baseline debility due to ICH and stiff person syndrome as well as seizures post ICH who presented on 9/23 with worsening mentation.  She was noted to have some deterioration of her mental status, and has been started on  IVIG.  She had multiple episodes concerning for seizures.  She is on multiple antiseizure medications already.  If she continues to have concern for seizures, she will need to be transferred somewhere that can do continuous EEG.  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 medications typically at home prior to presentation.  Recommendations: -Continue antiseizure medications at the dose currently being used.  -Vimpat 200 twice daily -Depakote total dose of 250 mg every 6 hours.  When able to take p.o., convert to 500 mg twice daily.  -Continue Keppra 1500 twice daily  -Continue Zonegran 100 mg in the morning and 300 mg at night.  -Continue gabapentin 100 mg 3 times a day. -As needed IV Ativan for seizure activity more than 5 minutes.  For home, intranasal midazolam was cost prohibitive.  Dr. Iver Nestle has recommended Ativan solution. -If she has any further seizure activity, would recommend transfer to a tertiary care center with LTM facilities. -Started IVIG 9/28--complete 5 doses as advised. -Management of of toxic metabolic derangements per primary team. - Neurology will follow.  Ritta Slot, MD [facility] [phone]  If 7pm- 7am, please page neurology on call as listed in AMION.

## 2023-03-02 NOTE — Progress Notes (Signed)
PRN Ativan given for witnessed focal seizure activity, effective  SBP's were in 190's, HR up to 140's Other VSS, o2 sats adequate on RA  Per MD & RPH OK to continue IVIG therapy Pt tolerating rate of 60 mg/kg/hr

## 2023-03-03 DIAGNOSIS — R569 Unspecified convulsions: Secondary | ICD-10-CM | POA: Diagnosis not present

## 2023-03-03 DIAGNOSIS — Z7189 Other specified counseling: Secondary | ICD-10-CM | POA: Diagnosis not present

## 2023-03-03 LAB — BASIC METABOLIC PANEL
Anion gap: 7 (ref 5–15)
BUN: 13 mg/dL (ref 6–20)
CO2: 20 mmol/L — ABNORMAL LOW (ref 22–32)
Calcium: 9.1 mg/dL (ref 8.9–10.3)
Chloride: 107 mmol/L (ref 98–111)
Creatinine, Ser: 0.89 mg/dL (ref 0.44–1.00)
GFR, Estimated: >60 mL/min (ref >60)
Glucose, Bld: 67 mg/dL — ABNORMAL LOW (ref 70–99)
Potassium: 3.6 mmol/L (ref 3.5–5.1)
Sodium: 134 mmol/L — ABNORMAL LOW (ref 135–145)

## 2023-03-03 LAB — MAGNESIUM: Magnesium: 1.9 mg/dL (ref 1.7–2.4)

## 2023-03-03 LAB — CBC WITH DIFFERENTIAL/PLATELET
Abs Immature Granulocytes: 0.01 10*3/uL (ref 0.00–0.07)
Basophils Absolute: 0 10*3/uL (ref 0.0–0.1)
Basophils Relative: 0 %
Eosinophils Absolute: 0 10*3/uL (ref 0.0–0.5)
Eosinophils Relative: 0 %
HCT: 32.2 % — ABNORMAL LOW (ref 36.0–46.0)
Hemoglobin: 11 g/dL — ABNORMAL LOW (ref 12.0–15.0)
Immature Granulocytes: 0 %
Lymphocytes Relative: 36 %
Lymphs Abs: 1.2 10*3/uL (ref 0.7–4.0)
MCH: 35.7 pg — ABNORMAL HIGH (ref 26.0–34.0)
MCHC: 34.2 g/dL (ref 30.0–36.0)
MCV: 104.5 fL — ABNORMAL HIGH (ref 80.0–100.0)
Monocytes Absolute: 0.4 10*3/uL (ref 0.1–1.0)
Monocytes Relative: 11 %
Neutro Abs: 1.7 10*3/uL (ref 1.7–7.7)
Neutrophils Relative %: 53 %
Platelets: 205 10*3/uL (ref 150–400)
RBC: 3.08 MIL/uL — ABNORMAL LOW (ref 3.87–5.11)
RDW: 14.6 % (ref 11.5–15.5)
WBC: 3.3 10*3/uL — ABNORMAL LOW (ref 4.0–10.5)
nRBC: 0 % (ref 0.0–0.2)

## 2023-03-03 LAB — PHOSPHORUS: Phosphorus: 3 mg/dL (ref 2.5–4.6)

## 2023-03-03 MED ORDER — CYANOCOBALAMIN 1000 MCG/ML IJ SOLN: 1000.0000 ug | INTRAMUSCULAR | Status: DC

## 2023-03-03 MED ORDER — SODIUM BICARBONATE 8.4 % IV SOLN: 50.0000 meq | Freq: Once | INTRAVENOUS | Status: DC

## 2023-03-03 MED ORDER — HYDRALAZINE HCL 20 MG/ML IJ SOLN
10.0000 mg | Freq: Four times a day (QID) | INTRAMUSCULAR | Status: DC | PRN
  Administered 2023-03-03 (×2): 10 mg via INTRAVENOUS
  Filled 2023-03-03 (×2): qty 1

## 2023-03-03 NOTE — Discharge Summary (Signed)
[facility] Discharge Summary   Patient: Katherine Fields ZOX:[id number]  PCP: Sherol Dade, DO  Date of admission: 02/23/2023   Date of discharge:  03/03/2023     Discharge Diagnoses:  Principal Problem:   Seizure [facility]) Active Problems:   Seizures (HCC)   Admitted From: SNF Disposition: Transfer to [facility] under neurology service. Accepting physician Dr. Dan Humphreys  Recommendations for Outpatient Follow-up:  PCP: as per dc recommendation from [facility] Follow up LABS/TEST:     Diet recommendation: Dysphagia type 1 thin Liquid  Activity: The patient is advised to gradually reintroduce usual activities, as tolerated  Discharge Condition: stable  Code Status: DNR   History of present illness: As per the H and P dictated on admission Hospital Course:  Katherine Fields is a 57 y.o. 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  Of note: recent outpatient neurology visit at [facility] last Friday 09/20, concern for elevated GAD Ab as cause for seizures, patient was referred to see neuro immunology clinic for possible intervention. Rx recs at that time: increasing Keppra from 1000 twice daily to 1250 twice daily;continue Depakote, Vimpat gabapentin and zonisamide same doses.   Hospital course / significant events:  09/23: received Ativan en route w/ EMS, postictal/sedated in ED. Labs unremarkable. CT head no acute findings. UA showed WBC 11-20. [facility] neurology physician was contacted by [facility] ED physician - they recommended increase Keppra to 1500 mg twice daily, Depakote 500 mg twice daily and zonisamide to 200 mg twice daily. Admitting hospitalist left messages to patient daughter.   09/24: seizure overnight per RN requiring IV ativan. This morning, daughters state she is about at her  baseline. SLP eval - essentially no swallow function, switched all meds to IV for now, if not recovering may need to consider palliative consult  # Breakthrough seizure Given dysphagia, question not swallowing meds well might be a reason for breakthrough seizures?  EEG normal (post/inter-interictal) ED physician discussed case patient's neurology in [facility], who recommended continue current treatment. Neurology consulted, recommended following medications, patient received IV meds which were switched to oral and again switched to IV as patient is having intermittent seizures and unable to take oral meds 9/28 continue Keppra 1500 mg IV twice daily, Depacon 250 mg IV every 6 hourly, Vimpat 200 mg IV twice daily, continue zonisamide 100 mg p.o. twice daily and 200 mg p.o. nightly Continue gabapentin 100 mg 3 times daily and Baclofen 10 mg p.o. 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  9/29 d/w neurologist, recommended to continue IVIG for 5 days, if  she has recurrent seizures then transferred to tertiary care center for LTM EEG monitoring otherwise we can try to manage here. If no  seizures then patient can be discharged to long-term care facility after IVIG 9/30 patient is a reluctant to take oral medications, we will continue IV meds and IVIG.  If no improvement then palliative care will be consulted again to discuss goals of care for possible hospice care. 10/1 discussed with neurology, patient will benefit from LTM EEG monitoring to titrate antiseizure medication accordingly. I called [facility] again and discussed with neurologist Dr. Dan Humphreys who accepted the patient to be transferred under their service.  So patient will be transferred when bed will be available.  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 No acute concern. CT head reassuring # Chronic dysphagia - worse now question postictal or med effects vs new baseline, question not swallowing meds well as reason for breakthrough seizures?  On pured diet at baseline, seen by SLP, started on pured diet again on 9/25 # HTN, BP initially low, held home lisinopril and metoprolol d/t cannot take po, hydralazine prn # Vitamin B12 deficiency: Started vitamin B12 1000 mcg IM injection.   Body mass index is 21.56 kg/m.  Nutrition Problem: Inadequate oral intake Etiology: dysphagia Nutrition Interventions: Interventions: Magic cup, MVI  Patient is bedbound at baseline  On the day of the discharge the patient's vitals were stable, and no other acute medical condition were reported by patient. the patient was felt safe to be discharge, to transfer to [facility] under neurology service.  Accepting physician Dr. Dan Humphreys.    Consultants: Neurologist Procedures: None  Discharge Exam: General: Appear in no distress, no Rash; Oral Mucosa Clear, but dry Cardiovascular: S1 and S2 Present, no  Murmur, Respiratory: normal respiratory effort, Bilateral Air entry present and no Crackles, no wheezes Abdomen: Bowel Sound present, Soft and no tenderness, no hernia Extremities: no Pedal edema, no calf tenderness Neurology: Nonverbal at baseline, bilateral lower extremity stiffness, right arm weakness power 3/5, left arm power 4/5.  Off-and-on follows command to move her hands and open her mouth.  Bedbound at baseline  [facility] Weights   02/23/23 1225 03/02/23 0351  Weight: 68 kg 60.6 kg   Vitals:   03/03/23 1446 03/03/23 1505  BP: (!) 151/84 136/83  Pulse: 93 91  Resp: 20 19  Temp: 98.2 F (36.8 C) 98.4 F (36.9 C)  SpO2: 100% 100%    DISCHARGE MEDICATION: Allergies as of 03/03/2023       Reactions   Tramadol    Other reaction(s): Unknown        Medication List     STOP taking these medications    pantoprazole 40 MG tablet Commonly known as: PROTONIX       TAKE these medications    acetaminophen 500 MG tablet Commonly known as: TYLENOL Take 1 tablet (500 mg total) by mouth every 6 (six) hours as needed for mild pain (or Fever >/= 101).   ammonium lactate 12 % cream Commonly known as: AMLACTIN Apply 1 Application topically at bedtime. Apply to bilateral feet and legs for dry skin   atorvastatin 40 MG tablet Commonly known as: LIPITOR Take 40 mg by mouth daily.   Baclofen 5 MG Tabs Take 2 tablets by mouth 2 (  two) times daily.   bisacodyl 10 MG suppository Commonly known as: DULCOLAX Place 10 mg rectally as needed for moderate constipation.   cetirizine 10 MG tablet Commonly known as: ZYRTEC Take 10 mg by mouth daily.   cyanocobalamin 1000 MCG/ML injection Commonly known as: VITAMIN B12 Inject 1 mL (1,000 mcg total) into the muscle every 30 (thirty) days for 12 doses.   Deep Sea Nasal Spray 0.65 % nasal spray Generic drug: sodium chloride Place 1 spray into the nose every 4 (four) hours as needed.   divalproex 125 MG capsule Commonly known as:  DEPAKOTE SPRINKLE Take 3 capsules (375 mg total) by mouth 2 (two) times daily.   divalproex 125 MG capsule Commonly known as: DEPAKOTE SPRINKLE Take 500 mg by mouth 2 (two) times daily.   DULoxetine HCl 40 MG Cpep Take 1 capsule by mouth daily. What changed: Another medication with the same name was removed. Continue taking this medication, and follow the directions you see here.   esomeprazole 20 MG capsule Commonly known as: NEXIUM Take 20 mg by mouth daily.   gabapentin 100 MG capsule Commonly known as: NEURONTIN Take 100 mg by mouth 3 (three) times daily.   Glucagon Emergency 1 MG Kit SMARTSIG:1 Milligram(s) IM Once PRN   guaiFENesin 100 MG/5ML liquid Commonly known as: ROBITUSSIN Take 5 mLs by mouth every 6 (six) hours as needed for cough or to loosen phlegm.   lacosamide 200 MG Tabs tablet Commonly known as: VIMPAT Take 200 mg by mouth 2 (two) times daily.   lactulose 10 GM/15ML solution Commonly known as: CHRONULAC Take 30 mLs by mouth 2 (two) times daily.   levETIRAcetam 1000 MG tablet Commonly known as: KEPPRA Take 1,000 mg by mouth 2 (two) times daily. Take with 250 mg for a total 1250 mg What changed: Another medication with the same name was removed. Continue taking this medication, and follow the directions you see here.   levETIRAcetam 250 MG tablet Commonly known as: KEPPRA 250 mg 2 (two) times daily. Take with 1000 mg for a total of 1250 mg What changed: Another medication with the same name was removed. Continue taking this medication, and follow the directions you see here.   lisinopril 20 MG tablet Commonly known as: ZESTRIL Take 20 mg by mouth daily.   LORazepam 0.5 MG tablet Commonly known as: ATIVAN Take 1 tablet (0.5 mg total) by mouth 2 (two) times daily as needed for anxiety. What changed: when to take this   LORazepam 2 MG/ML injection Commonly known as: ATIVAN Inject 1 mg into the muscle once. Only use if having a seizer What changed:  Another medication with the same name was changed. Make sure you understand how and when to take each.   metoprolol tartrate 25 MG tablet Commonly known as: LOPRESSOR Take 25 mg by mouth 2 (two) times daily.   ondansetron 4 MG tablet Commonly known as: ZOFRAN Take 1 tablet (4 mg total) by mouth every 8 (eight) hours as needed for nausea or vomiting.   One-Daily Multi-Vit/Mineral Tabs Take 1 tablet by mouth daily.   oxyCODONE 5 MG immediate release tablet Commonly known as: Oxy IR/ROXICODONE Take 5 mg by mouth every 6 (six) hours as needed.   promethazine 25 MG/ML injection Commonly known as: PHENERGAN Inject 25 mg into the muscle every 4 (four) hours as needed for vomiting or nausea.   senna 8.6 MG Tabs tablet Commonly known as: SENOKOT Take 2 tablets by mouth 2 (two) times daily.  sodium phosphate Pediatric 3.5-9.5 GM/59ML enema Place 1 enema rectally. Only Tuesday and Wednesday   Vitamin D (Cholecalciferol) 25 MCG (1000 UT) Tabs Take 1 tablet by mouth daily.   zonisamide 100 MG capsule Commonly known as: ZONEGRAN Take 1 capsule (100 mg total) by mouth 2 (two) times daily. What changed:  how much to take when to take this       Allergies  Allergen Reactions   Tramadol     Other reaction(s): Unknown   Discharge Instructions     Call MD for:   Complete by: As directed    Intractable seizures   Discharge instructions   Complete by: As directed    To follow neurology for further recommendation   Increase activity slowly   Complete by: As directed        The results of significant diagnostics from this hospitalization (including imaging, microbiology, ancillary and laboratory) are listed below for reference.    Significant Diagnostic Studies: EEG adult  Result Date: 29-Mar-2023 Charlsie Quest, MD     March 29, 2023  4:46 PM Patient Name: Katherine Fields MRN: [id number] Epilepsy Attending: Charlsie Quest Referring Physician/Provider: Rejeana Brock,  MD Date: Mar 29, 2023 Duration: 23.44 mins Patient history: 57yo f with ams getting eeg to evaluate for seizure Level of alertness: Awake AEDs during EEG study: LEV, LCM, VP, ZNS, GBP Technical aspects: This EEG study was done with scalp electrodes positioned according to the 10-20 International system of electrode placement. Electrical activity was reviewed with band pass filter of 1-70Hz , sensitivity of 7 uV/mm, display speed of 68mm/sec with a 60Hz  notched filter applied as appropriate. EEG data were recorded continuously and digitally stored.  Video monitoring was available and reviewed as appropriate. Description: The posterior dominant rhythm consists of 8 Hz activity of moderate voltage (25-35 uV) seen predominantly in posterior head regions, symmetric and reactive to eye opening and eye closing. EEG showed intermittent generalized 5 to 7 Hz theta slowing. Physiologic photic driving was not seen during photic stimulation.  Hyperventilation was not performed.   ABNORMALITY - Intermittent slow, generalized IMPRESSION: This study is suggestive of mild diffuse encephalopathy. No seizures or epileptiform discharges were seen throughout the recording. Charlsie Quest   DG Shoulder Left  Result Date: 02/27/2023 CLINICAL DATA:  Left shoulder dislocation. EXAM: LEFT SHOULDER - 2+ VIEW COMPARISON:  Chest radiograph dated 02/25/2023. FINDINGS: Evaluation is very limited due to patient's positioning and in the absence of an axial view. There is overlapping of the medial humeral head over the glenoid on the frontal view which may be positional or represent a degree of posterior dislocation. On the Y-view however the radial head appears in alignment with the glenoid. No acute fracture. The bones are osteopenic. The soft tissues are unremarkable. IMPRESSION: Possible degree of posterior shoulder dislocation. An axillary view recommended for better evaluation. Electronically Signed   By: Elgie Collard M.D.   On:  02/27/2023 16:19   DG Chest Port 1 View  Result Date: 02/25/2023 CLINICAL DATA:  Seizure EXAM: PORTABLE CHEST 1 VIEW COMPARISON:  02/23/2023 FINDINGS: Normal heart size and mediastinal contours. No acute infiltrate or edema. No effusion or pneumothorax. No acute osseous findings. IMPRESSION: No active disease. Electronically Signed   By: Tiburcio Pea M.D.   On: 02/25/2023 08:00   EEG adult  Result Date: 02/24/2023 Charlsie Quest, MD     02/24/2023  1:09 PM Patient Name: Katherine Fields MRN: [id number] Epilepsy Attending: Charlsie Quest Referring Physician/Provider: Emeline General,  MD Date: 02/24/2023 Duration: 31.04 mins Patient history: 57yo F with epilepsy presented with breakthrough seizure getting eeg to evaluate for seizure Level of alertness: Awake AEDs during EEG study: LEV, LCM, VPA, GBP Technical aspects: This EEG study was done with scalp electrodes positioned according to the 10-20 International system of electrode placement. Electrical activity was reviewed with band pass filter of 1-70Hz , sensitivity of 7 uV/mm, display speed of 20mm/sec with a 60Hz  notched filter applied as appropriate. EEG data were recorded continuously and digitally stored.  Video monitoring was available and reviewed as appropriate. Description: The posterior dominant rhythm consists of 8-9 Hz activity of moderate voltage (25-35 uV) seen predominantly in posterior head regions, symmetric and reactive to eye opening and eye closing. Hyperventilation and photic stimulation were not performed.   IMPRESSION: This study is within normal limits. No seizures or epileptiform discharges were seen throughout the recording. A normal interictal EEG does not exclude the diagnosis of epilepsy. Priyanka Annabelle Harman   CT HEAD WO CONTRAST ( )  Result Date: 02/23/2023 CLINICAL DATA:  Mental status change EXAM: CT HEAD WITHOUT CONTRAST TECHNIQUE: Contiguous axial images were obtained from the base of the skull through the vertex without  intravenous contrast. RADIATION DOSE REDUCTION: This exam was performed according to the departmental dose-optimization program which includes automated exposure control, adjustment of the mA and/or kV according to patient size and/or use of iterative reconstruction technique. COMPARISON:  Head CT 12/26/2022.  MRI head 08/06/2027. FINDINGS: Brain: No evidence of acute infarction, hemorrhage, hydrocephalus, or extra-axial fluid collection. Again seen is a moderate-sized focal area of chronic encephalomalacia/gliosis in the left frontal lobe. There is ex vacuo dilatation of the adjacent frontal horn of the left lateral ventricle, unchanged. Vascular: No hyperdense vessel or unexpected calcification. Skull: Normal. Negative for fracture or focal lesion. Sinuses/Orbits: No acute finding. Other: None. IMPRESSION: 1. No acute intracranial abnormality. 2. Stable moderate-sized focal area of chronic encephalomalacia/gliosis in the left frontal lobe. Electronically Signed   By: Darliss Cheney M.D.   On: 02/23/2023 15:54   DG Chest Portable 1 View  Result Date: 02/23/2023 CLINICAL DATA:  Seizures EXAM: PORTABLE CHEST - 1 VIEW COMPARISON:  08/31/2022 FINDINGS: Mild left retrocardiac atelectasis/consolidation.  Right lung clear. Heart size and mediastinal contours are within normal limits. No effusion. Visualized bones unremarkable. IMPRESSION: Left retrocardiac atelectasis/consolidation. Electronically Signed   By: Corlis Leak M.D.   On: 02/23/2023 14:56    Microbiology: Recent Results (from the past 240 hour(s))  MRSA Next Gen by PCR, Nasal     Status: None   Collection Time: 02/24/23  2:15 PM   Specimen: Nasal Mucosa; Nasal Swab  Result Value Ref Range Status   MRSA by PCR Next Gen NOT DETECTED NOT DETECTED Final    Comment: (NOTE) The GeneXpert MRSA Assay (FDA approved for NASAL specimens only), is one component of a comprehensive MRSA colonization surveillance program. It is not intended to diagnose MRSA  infection nor to guide or monitor treatment for MRSA infections. Test performance is not FDA approved in patients less than 18 years old. Performed at [facility], [address] [place], [place] [address]      Labs: CBC: Recent Labs  Lab 02/27/23 0520 02/28/23 0530 03/01/23 0605 03/02/23 0508 03/03/23 0916  WBC 4.4 4.7 3.5* 3.1* 3.3*  NEUTROABS  --   --   --  2.1 1.7  HGB 11.1* 11.0* 10.6* 11.5* 11.0*  HCT 31.2* 30.7* 30.2* 33.4* 32.2*  MCV 102.3* 102.3* 102.0* 103.1*  104.5*  PLT 216 212 190 214 205   Basic Metabolic Panel: Recent Labs  Lab 02/26/23 0616 02/27/23 0520 02/28/23 0530 03/01/23 0605 03/02/23 0508 03/03/23 0916  NA 136 138 140 136 137 134*  K 3.6 3.4* 3.4* 3.3* 3.4* 3.6  CL 107 107 111 108 109 107  CO2 20* 21* 21* 20* 19* 20*  GLUCOSE 75 78 83 76 78 67*  BUN 12 11 8 8 11 13   CREATININE 0.93 0.91 0.83 0.77 0.88 0.89  CALCIUM 9.1 9.3 9.4 8.8* 9.1 9.1  MG 2.0 1.9 2.0  --  1.8 1.9  PHOS 4.1 3.1 3.1  --  2.8 3.0   Liver Function Tests: No results for input(s): "AST", "ALT", "ALKPHOS", "BILITOT", "PROT", "ALBUMIN" in the last 168 hours. No results for input(s): "LIPASE", "AMYLASE" in the last 168 hours. No results for input(s): "AMMONIA" in the last 168 hours. Cardiac Enzymes: No results for input(s): "CKTOTAL", "CKMB", "CKMBINDEX", "TROPONINI" in the last 168 hours. BNP (last 3 results) Recent Labs    05/09/22 0911  BNP 77.4   CBG: Recent Labs  Lab 02/25/23 0720 02/27/23 2147  GLUCAP 142* 78    Time spent: 35 minutes  Signed:  Gillis Santa  [facility]  03/03/2023 4:13 PM

## 2023-03-03 NOTE — TOC Progression Note (Signed)
Transition of Care [facility]) - Progression Note    Patient Details  Name: Katherine Fields MRN: [id number] Date of Birth: September 03, 1965  Transition of [facility] CM/SW Contact  Truddie Hidden, RN Phone Number: 03/03/2023, 10:01 AM  Clinical Narrative:    TOC continuing to follow patient's progress throughout discharge planning.        Expected Discharge Plan and Services                                               Social Determinants of Health (SDOH) Interventions SDOH Screenings   Food Insecurity: Patient Unable To Answer (02/24/2023)  Housing: Patient Unable To Answer (02/24/2023)  Transportation Needs: Patient Unable To Answer (02/24/2023)  Utilities: Patient Unable To Answer (02/24/2023)  Financial Resource Strain: Low Risk  (07/28/2021)   Received from [facility]  Tobacco Use: Low Risk  (02/24/2023)    Readmission Risk Interventions     No data to display

## 2023-03-03 NOTE — Progress Notes (Addendum)
Made Dr. Lucianne Muss aware that patient's daughter Cay Kath called back and states they now want the patient to be transferred to [facility]. Dr. Lucianne Muss acknowledged.   I discussed with patient's daughter and when she stated she does not want to transfer to [facility] so I canceled the [facility] transfer.  I called her again and discussed with palliative care as well.  Now final decision is that she needs to be comfort measures.   Palliative care will discuss with her daughters tomorrow regarding placement [facility] hospice facility.

## 2023-03-03 NOTE — Progress Notes (Signed)
[facility] Progress Note  Patient: Katherine Fields    GEX:[id number]  DOA: 02/23/2023     Date of Service: the patient was seen and examined on 03/03/2023  Chief Complaint  Patient presents with   Seizures    History of seizures - Patient brought in by EMS from [facility] for seizures; Was given Ativan 1 mg IM by WOM, patient continued to have focal seizures, IV was established by EMS and an additional Versed 5 mg IV was administered; Patient is somnolent upon arrival but does respond to verbal stimuli, Pupils are equal, round and reactive   Brief hospital course: Katherine Fields is a 57 y.o. 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    Of note: recent outpatient neurology visit at [facility] last Friday 09/20, concern for elevated GAD Ab as cause for seizures, patient was referred to see neuro immunology clinic for possible intervention. Rx recs at that time: increasing Keppra from 1000 twice daily to 1250 twice daily;continue Depakote, Vimpat gabapentin and zonisamide same doses.      Hospital course / significant events:  09/23: received Ativan en route w/ EMS, postictal/sedated in ED. Labs unremarkable. CT head no acute findings. UA showed WBC 11-20. [facility] neurology physician was contacted by [facility] ED physician - they recommended increase Keppra to 1500 mg twice daily, Depakote 500 mg twice daily and zonisamide to 200 mg twice daily. Admitting hospitalist left messages to patient daughter.   09/24: seizure overnight per RN requiring IV ativan. This morning, daughters state she is about at her baseline. SLP eval - essentially no swallow function, switched all meds to IV for now, if not recovering may need to consider palliative consult tomorrow      Consultants:  EDP spoke w/  pt's outpatient neurologist, consulted Neuro Dr Iver Nestle was following   Procedures/Surgeries: None    Assessment and Plan:  # Breakthrough seizure Given dysphagia, question not swallowing meds well might be a reason for breakthrough seizures?  EEG normal (post/inter-interictal) ED physician discussed case patient's neurology in [facility], who recommended continue current treatment. Neurology consulted, recommended following medications, patient received IV meds which were switched to oral and again switched to IV as patient is having intermittent seizures and unable to take oral meds 9/28 continue Keppra 1500 mg IV twice daily, Depacon 250 mg IV every 6 hourly, Vimpat 200 mg IV twice daily, continue zonisamide 100 mg p.o. twice daily and 200 mg p.o. nightly Continue gabapentin 100 mg 3 times daily and Baclofen 10 mg p.o. 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  9/29 d/w neurologist, recommended to continue IVIG for 5 days, if she has recurrent seizures then transferred to tertiary care center for LTM EEG monitoring otherwise we can try to manage here. If no seizures then patient  can be discharged to long-term care facility after IVIG 9/30 patient is a reluctant to take oral medications, we will continue IV meds and IVIG.  If no improvement then palliative care will be consulted again to discuss goals of care for possible hospice care.  10/1 discussed with neurology, patient will benefit from LTM EEG monitoring to titrate antiseizure medication accordingly. I called [facility] again and discussed with neurologist Dr. Dan Humphreys who accepted the patient to be transferred under their service.  I informed to patient's daughter and she said okay but she needs to discuss with other sisters and she will get back to me I got information by RN that patient's daughter wanted to cancel transfer and move forward to comfort measures only. I called patient's daughter again and she wants her mother to be transferred to back to the facility with comfort measures.  So I called [facility] and canceled the transfer. Follow palliative care and hospice placement.   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  On pured diet at baseline, seen by SLP, started on pured diet again on 9/25    HTN BP initially low, held home lisinopril and metoprolol d/t cannot take po, hydralazine prn  Vitamin B12 deficiency: Started vitamin B12 1000 mcg IM injection.  Body mass index is 21.56 kg/m.  Nutrition Problem: Inadequate oral intake Etiology: dysphagia Interventions: Interventions: Magic cup, MVI   Diet: Pured DVT Prophylaxis: Subcutaneous Lovenox   Advance goals of care discussion: DNR/DNI and NO feeding  Tube  Family Communication: family was not present at bedside, at the time of interview.  The pt provided permission to discuss medical plan with the family. Opportunity was given to ask question and all questions were answered satisfactorily.  10/1 discussed with patient's daughter over the phone, wanted to move forward with hospice care, canceled [facility] transfer. Follow palliative care for hospice placement  Disposition:  Pt is LTC resident from [facility], admitted with intractable seizures, still has seizure, which precludes a safe discharge. Started IVIG for 5 days total, till 03/04/23 Discharge to same facility, when cleared by Neurologist. 10/1 Follow palliative care and hospice placement.  Subjective: Overnight patient had hypothermia in the episode of seizure, after receiving IVIG.  In the morning time patient remains same at baseline, no events.  Physical Exam: General: NAD, comfortable.   Eyes: PERRLA ENT: Oral Mucosa Clear, dry Neck: no JVD,  Cardiovascular: S1 and S2 Present, no Murmur,  Respiratory: good respiratory effort, Bilateral Air entry equal and Decreased, no Crackles, no wheezes Abdomen: Bowel Sound present, Soft and no tenderness,  Skin: no rashes Extremities: no Pedal edema, no calf tenderness Neurologic: Stiffness of lower extremities, right arm power 3/5, left arm power 4/5 Nonambulatory at baseline  Vitals:   03/03/23 1430 03/03/23 1446 03/03/23 1505 03/03/23 1630  BP: (!) 145/93 (!) 151/84 136/83 (!) 148/94  Pulse: 97 93 91  Resp: 20 20 19 19   Temp: 98.2 F (36.8 C) 98.2 F (36.8 C) 98.4 F (36.9 C)   TempSrc: Axillary Axillary Axillary   SpO2: 100% 100% 100% 100%  Weight:      Height:        Intake/Output Summary (Last 24 hours) at 03/03/2023 1658 Last data filed at 03/03/2023 1343 Gross per 24 hour  Intake 330 ml  Output 1050 ml  Net -720 ml   Filed Weights   02/23/23 1225 03/02/23 0351  Weight: 68 kg 60.6 kg    Data Reviewed: I  have personally reviewed and interpreted daily labs, tele strips, imagings as discussed above. I reviewed all nursing notes, pharmacy notes, vitals, pertinent old records I have discussed plan of care as described above with RN and patient/family.  CBC: Recent Labs  Lab 02/27/23 0520 02/28/23 0530 03/01/23 0605 03/02/23 0508 03/03/23 0916  WBC 4.4 4.7 3.5* 3.1* 3.3*  NEUTROABS  --   --   --  2.1 1.7  HGB 11.1* 11.0* 10.6* 11.5* 11.0*  HCT 31.2* 30.7* 30.2* 33.4* 32.2*  MCV 102.3* 102.3* 102.0* 103.1* 104.5*  PLT 216 212 190 214 205   Basic Metabolic Panel: Recent Labs  Lab 02/26/23 0616 02/27/23 0520 02/28/23 0530 03/01/23 0605 03/02/23 0508 03/03/23 0916  NA 136 138 140 136 137 134*  K 3.6 3.4* 3.4* 3.3* 3.4* 3.6  CL 107 107 111 108 109 107  CO2 20* 21* 21* 20* 19* 20*  GLUCOSE 75 78 83 76 78 67*  BUN 12 11 8 8 11 13   CREATININE 0.93 0.91 0.83 0.77 0.88 0.89  CALCIUM 9.1 9.3 9.4 8.8* 9.1 9.1  MG 2.0 1.9 2.0  --  1.8 1.9  PHOS 4.1 3.1 3.1  --  2.8 3.0    Studies: No results found.  Scheduled Meds:  baclofen  10 mg Oral BID   cyanocobalamin  1,000 mcg Intramuscular Daily   Followed by   Melene Muller ON 03/05/2023] cyanocobalamin  1,000 mcg Intramuscular Weekly   Followed by   Melene Muller ON 04/02/2023] cyanocobalamin  1,000 mcg Intramuscular Q30 days   enoxaparin (LOVENOX) injection  40 mg Subcutaneous Q24H   gabapentin  100 mg Oral Q8H   multivitamin with minerals  1 tablet Oral Daily   zonisamide  100 mg Oral BID   zonisamide  200 mg Oral QHS   Continuous Infusions:  dextrose 5 % and 0.9 % NaCl 20 mL/hr at 03/02/23 0634   Immune Globulin 10% 36.4 mL/hr at 03/03/23 1436   lacosamide (VIMPAT) IV 200 mg (03/03/23 1021)   levETIRAcetam 1,500 mg (03/03/23 0940)   sodium chloride 250 mL (03/03/23 1343)   And   sodium chloride 500 mL (03/03/23 0035)   valproate sodium 250 mg (03/03/23 1232)   PRN Meds: acetaminophen, bisacodyl, hydrALAZINE, LORazepam, LORazepam,  [DISCONTINUED] ondansetron **OR** ondansetron (ZOFRAN) IV, mouth rinse, oxyCODONE  Time spent: 60 minutes  Author: Gillis Santa. MD [facility] 03/03/2023 4:58 PM  To reach On-call, see care teams to locate the attending and reach out to them via [email or web address]. If 7PM-7AM, please contact night-coverage If you still have difficulty reaching the attending provider, please page the [facility] (Director on Call) for [facility] on amion for assistance.

## 2023-03-03 NOTE — Progress Notes (Signed)
Neurology Progress Note  Subjective: Sz overnight   Exam: Vitals:   03/03/23 2030 03/03/23 2043  BP: (!) 164/96   Pulse:    Resp: 20   Temp:  98.4 F (36.9 C)  SpO2:     Gen: In bed, eyes open Resp: non-labored breathing  Neurological exam Eyes open Inconsistently tracks examiner Pupils equal round reactive to light She follows command to show two fingers as well as wiggle her toes. Cranial nerves: Pupils equal round react light, extraocular movements difficult to assess, seems to be preferring the right but also looks to the left some.  Blinks to threat from both sides consistently. Motor examination: Increased tone in all extremities.  She wiggles toes and shows thumb on the right side to command, moves left side to noxious stimulation.  Pertinent Labs:  Basic Metabolic Panel: Recent Labs  Lab 02/26/23 0616 02/27/23 0520 02/28/23 0530 03/01/23 0605 03/02/23 0508 03/03/23 0916  NA 136 138 140 136 137 134*  K 3.6 3.4* 3.4* 3.3* 3.4* 3.6  CL 107 107 111 108 109 107  CO2 20* 21* 21* 20* 19* 20*  GLUCOSE 75 78 83 76 78 67*  BUN 12 11 8 8 11 13   CREATININE 0.93 0.91 0.83 0.77 0.88 0.89  CALCIUM 9.1 9.3 9.4 8.8* 9.1 9.1  MG 2.0 1.9 2.0  --  1.8 1.9  PHOS 4.1 3.1 3.1  --  2.8 3.0    CBC: Recent Labs  Lab 02/27/23 0520 02/28/23 0530 03/01/23 0605 03/02/23 0508 03/03/23 0916  WBC 4.4 4.7 3.5* 3.1* 3.3*  NEUTROABS  --   --   --  2.1 1.7  HGB 11.1* 11.0* 10.6* 11.5* 11.0*  HCT 31.2* 30.7* 30.2* 33.4* 32.2*  MCV 102.3* 102.3* 102.0* 103.1* 104.5*  PLT 216 212 190 214 205    GAD65 Ab, Serum 3290 (H) <=0.02 nmol/L  11/27/2022 11:22 AM EDT     Assessment:  57 year old with significant baseline debility due to ICH and stiff person syndrome as well as seizures post ICH who presented on 9/23 with worsening mentation.  She was noted to have some deterioration of her mental status, and has been started on IVIG.  She had multiple episodes concerning for  seizures.  She is on multiple antiseizure medications already.  With further seizures, I am not sure if her sedation is overmedication or breakthrough seizures. I do think missing zonisamide likely played a role. I would favor restarting this, including NG tube if needed and transfer to tertiary center unless family wants to move to comfort care.   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 medications typically at home prior to presentation.  Recommendations: -Continue antiseizure medications at the dose currently being used.  -Vimpat 200 twice daily -Depakote total dose of 250 mg every 6 hours.  When able to take p.o., convert to 500 mg twice daily.  -Continue Keppra 1500 twice daily  -Continue Zonegran 100 mg in the morning and 300 mg at night.  -Continue gabapentin 100 mg 3 times a day. -As needed IV Ativan for seizure activity more than 5 minutes.  For home, intranasal midazolam was cost prohibitive.  Dr. Iver Nestle has recommended Ativan solution. -recommend transfer to a tertiary care center with LTM facilities. -Started IVIG 9/28--complete 5 doses as advised. -Management of of  toxic metabolic derangements per primary team. - Neurology will follow.   Ritta Slot, MD [facility] [phone]  If 7pm- 7am, please page neurology on call as listed in AMION.

## 2023-03-03 NOTE — Plan of Care (Signed)

## 2023-03-03 NOTE — Progress Notes (Signed)
Daily Progress Note   Patient Name: Katherine Fields       Date: 03/03/2023 DOB: 31-Mar-1966  Age: 57 y.o. MRN#: [id number] Attending Physician: Gillis Santa, MD Primary Care Physician: Sherol Dade, DO Admit Date: 02/23/2023  Reason for Consultation/Follow-up: Establishing goals of care  Subjective: Notes and lab reviewed.  As per note, patient had a seizure this morning.  She has been on IVIG since 9/28.  As per notes patient's p.o. intake has been very poor.  Spoke with RD who confirms the same.  Called to speak with patient's daughter [name].  Second daughter was on the phone with her.  They discuss updates and states this morning patient did take her medications as they were present at the time they were administered.  We discussed her overall p.o. intake and daughters voiced understanding that she is not eating enough to sustain her.  We discussed different options.  Daughters state that patient has been clear she does not want a core track or PEG feeding tube for any length of time.  We discussed the importance of nutrition.  Daughters to discuss care moving forward.  PMT will reach back out to family tomorrow.  Length of Stay: 7  Current Medications: Scheduled Meds:   baclofen  10 mg Oral BID   cyanocobalamin  1,000 mcg Intramuscular Daily   Followed by   Melene Muller ON 03/05/2023] cyanocobalamin  1,000 mcg Intramuscular Weekly   Followed by   Melene Muller ON 04/02/2023] cyanocobalamin  1,000 mcg Intramuscular Q30 days   enoxaparin (LOVENOX) injection  40 mg Subcutaneous Q24H   gabapentin  100 mg Oral Q8H   multivitamin with minerals  1 tablet Oral Daily   zonisamide  100 mg Oral BID   zonisamide  200 mg Oral QHS    Continuous Infusions:  dextrose 5 % and 0.9 % NaCl 20 mL/hr at  03/02/23 0634   Immune Globulin 10% Stopped (03/03/23 0035)   lacosamide (VIMPAT) IV 200 mg (03/03/23 1021)   levETIRAcetam 1,500 mg (03/03/23 0940)   sodium chloride 250 mL (03/02/23 1439)   And   sodium chloride 500 mL (03/03/23 0035)   valproate sodium 250 mg (03/03/23 1232)    PRN Meds: acetaminophen, bisacodyl, hydrALAZINE, LORazepam, LORazepam, [DISCONTINUED] ondansetron **OR** ondansetron (ZOFRAN) IV, mouth rinse, oxyCODONE  Physical Exam Constitutional:  Comments: Eyes closed  Pulmonary:     Effort: Pulmonary effort is normal.             Vital Signs: BP 119/66 (BP Location: Right Arm)   Pulse (!) 110   Temp (!) 97 F (36.1 C) (Axillary)   Resp 20   Ht 5\' 6"  (1.676 m)   Wt 60.6 kg Comment: weight with 1 chux pad, blanket, sheet, pillow  LMP  (LMP Unknown)   SpO2 98%   BMI 21.56 kg/m  SpO2: SpO2: 98 % O2 Device: O2 Device: Room Air O2 Flow Rate: O2 Flow Rate (L/min): 0 L/min  Intake/output summary:  Intake/Output Summary (Last 24 hours) at 03/03/2023 1259 Last data filed at 03/03/2023 1021 Gross per 24 hour  Intake 240 ml  Output 1050 ml  Net -810 ml   LBM: Last BM Date : 02/28/23 Baseline Weight: Weight: 68 kg Most recent weight: Weight: 60.6 kg (weight with 1 chux pad, blanket, sheet, pillow)     Patient Active Problem List   Diagnosis Date Noted   Paraplegia (HCC) 08/04/2022   Seizure disorder (HCC) 08/04/2022   Proctocolitis 08/02/2022   Urinary tract infection 08/01/2022   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   G tube feedings (HCC) 08/01/2022   Hypoglycemia 05/09/2022   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   Chronic pain 05/09/2022   UTI (urinary tract infection) 05/09/2022   Essential hypertension 05/09/2022   Functional quadriplegia (HCC) 05/09/2022   Epilepsy, generalized, convulsive (HCC)    Dysphagia    Seizures (HCC) 09/24/2016   Seizure (HCC) 03/14/2016   Dysphagia following cerebral infarction 10/18/2014   Hemiparesis affecting dominant  side as late effect of cerebrovascular accident (HCC) 03/02/2014   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 intake with daughters.  Daughters to consider care moving forward.  PMT will continue to follow.  Code Status:    Code Status Orders  (From admission, onward)           Start     Ordered   02/23/23 1520  Do not attempt resuscitation (DNR)- Limited -Do Not Intubate (DNI)  Continuous       Question Answer Comment  If pulseless and not breathing No CPR or chest compressions.   In Pre-Arrest Conditions (Patient Is Breathing and Has A Pulse) Do not intubate. Provide all appropriate non-invasive medical interventions. Avoid ICU transfer unless indicated or required.   Consent: Discussion documented in EHR or advanced directives reviewed      02/23/23 1520           Code Status History     Date Active Date Inactive Code Status Order ID Comments User Context   08/31/2022 0638 08/31/2022 1439 DNR [id number]  Delton Prairie, MD ED   08/01/2022 0407 08/04/2022 2322 DNR [id number]  Andris Baumann, MD Inpatient   05/09/2022 1413 05/10/2022 1529 DNR [id number]  Lucile Shutters, MD ED   09/24/2016 2035 09/30/2016 2336 Full Code [id number]  Adrian Saran, MD ED   03/14/2016 1537 03/14/2016 1545 Full Code [id number]  Milagros Loll, MD ED      Advance Directive Documentation    Flowsheet Row Most Recent Value  Type of Advance Directive Out of facility DNR (pink MOST or yellow form)  Pre-existing out of facility DNR order (yellow form or pink MOST form) Pink MOST form placed in chart (order not valid for inpatient use)  "MOST" Form in Place? --  Prognosis: Poor    Care plan was discussed with RD  Thank you for allowing the Palliative Medicine Team to assist in the care of this patient.    Morton Stall, NP  Please contact Palliative Medicine Team phone at  [phone] for questions and concerns.

## 2023-03-03 NOTE — Progress Notes (Signed)
Nutrition Follow-up  DOCUMENTATION CODES:   Not applicable  INTERVENTION:   -Continue Magic cup TID with meals, each supplement provides 290 kcal and 9 grams of protein  -Continue MVI with minerals daily -Continue feeding assistance with meals  NUTRITION DIAGNOSIS:   Inadequate oral intake related to dysphagia as evidenced by per patient/family report.  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  9/24- s/p BSE- NPO 9/25- s/p BSE- advanced to dysphagia 1 diet with nectar thickened liquids 9/30- s/p EEG- reveals mild diffuse encephalopathy  Reviewed I/O's: -1.1 L x 24 hours and +961 ml since admission  UOP: 1.1 L x 24 hours  Palliative care following; pt is a DNR/ DNI. Pt does not want a feeding tube.   Pt lying in bed, unresponsive at time of visit.   Pt had another seizure early this morning. Pt unable to following commands or swallow at this time. Noted pt has been having difficulty taking medications as well as poor oral intake this admission. Meal completions documented 0-15%.   Pt not eating enough PO's to sustain herself. Per chart review, family considering comfort care if this worsens.   Case discussed extensively with palliative care team. Family still does not desire feeding tube and will likely transition to comfort care tomorrow.    Medications reviewed and include vitamin B-12, vimpat, keppra, and neurontin.   Labs reviewed: K: 3.4, CBGS: 78 (inpatient orders for glycemic control are none).    Diet Order:   Diet Order              DIET - DYS 1 Room service appropriate? Yes with Assist; Fluid consistency: Thin  Diet effective now                   EDUCATION NEEDS:   No education needs have been identified at this time  Skin:  Skin Assessment: Reviewed RN Assessment  Last BM:  02/28/23 (type 6)  Height:   Ht Readings from Last 1 Encounters:  02/23/23 5\' 6"  (1.676 m)    Weight:   Wt Readings from Last 1 Encounters:  03/02/23 60.6 kg    Ideal Body Weight:  59.1 kg  BMI:  Body mass index is 21.56 kg/m.  Estimated Nutritional Needs:   Kcal:  1550-1750  Protein:  85-100 grams  Fluid:  > 1.5 L    Levada Schilling, RD, LDN, CDCES Registered Dietitian II Certified Diabetes Care and Education Specialist Please refer to [facility] for RD and/or RD on-call/weekend/after hours pager

## 2023-03-03 NOTE — Progress Notes (Addendum)
EOS: At start of shift, inconsistent tracking, PERRLA, right sided deficits (prior ICH), not following commands. Witnessed seizure overnight, elevated BP, see eMAR for PRN's. Pt following basic commands after somnolent post ictal period. Not able to prompt pt for safe swallowing, pt unable to follow more complex commands at this time.   9604: PRN Ativan for seizure activity. Overnight pt observed, twitching/jerking, staring, altered breathing pattern & facial expression  Charge RN Noreene Larsson updated throughout shift with all changes, progress, & MEWS

## 2023-03-03 NOTE — Progress Notes (Signed)
Made Dr. Lucianne Muss aware that patient's daughter Esperansa Sarabia called and states that her and her sisters do not want patient transferred to [facility], they want her sent back to [facility] when she is ready for discharge. MD acknowledged.

## 2023-03-04 DIAGNOSIS — Z515 Encounter for palliative care: Secondary | ICD-10-CM

## 2023-03-04 DIAGNOSIS — R569 Unspecified convulsions: Secondary | ICD-10-CM | POA: Diagnosis not present

## 2023-03-04 DIAGNOSIS — Z7189 Other specified counseling: Secondary | ICD-10-CM | POA: Diagnosis not present

## 2023-03-04 LAB — CBC WITH DIFFERENTIAL/PLATELET
Abs Immature Granulocytes: 0.01 10*3/uL (ref 0.00–0.07)
Basophils Absolute: 0 10*3/uL (ref 0.0–0.1)
Basophils Relative: 1 %
Eosinophils Absolute: 0 10*3/uL (ref 0.0–0.5)
Eosinophils Relative: 0 %
HCT: 30.5 % — ABNORMAL LOW (ref 36.0–46.0)
Hemoglobin: 10.4 g/dL — ABNORMAL LOW (ref 12.0–15.0)
Immature Granulocytes: 0 %
Lymphocytes Relative: 35 %
Lymphs Abs: 1.1 10*3/uL (ref 0.7–4.0)
MCH: 35.7 pg — ABNORMAL HIGH (ref 26.0–34.0)
MCHC: 34.1 g/dL (ref 30.0–36.0)
MCV: 104.8 fL — ABNORMAL HIGH (ref 80.0–100.0)
Monocytes Absolute: 0.5 10*3/uL (ref 0.1–1.0)
Monocytes Relative: 14 %
Neutro Abs: 1.6 10*3/uL — ABNORMAL LOW (ref 1.7–7.7)
Neutrophils Relative %: 50 %
Platelets: 220 10*3/uL (ref 150–400)
RBC: 2.91 MIL/uL — ABNORMAL LOW (ref 3.87–5.11)
RDW: 14.6 % (ref 11.5–15.5)
WBC: 3.2 10*3/uL — ABNORMAL LOW (ref 4.0–10.5)
nRBC: 0 % (ref 0.0–0.2)

## 2023-03-04 LAB — BASIC METABOLIC PANEL
Anion gap: 7 (ref 5–15)
BUN: 14 mg/dL (ref 6–20)
CO2: 20 mmol/L — ABNORMAL LOW (ref 22–32)
Calcium: 8.8 mg/dL — ABNORMAL LOW (ref 8.9–10.3)
Chloride: 108 mmol/L (ref 98–111)
Creatinine, Ser: 0.91 mg/dL (ref 0.44–1.00)
GFR, Estimated: >60 mL/min (ref >60)
Glucose, Bld: 75 mg/dL (ref 70–99)
Potassium: 3.2 mmol/L — ABNORMAL LOW (ref 3.5–5.1)
Sodium: 135 mmol/L (ref 135–145)

## 2023-03-04 LAB — MAGNESIUM: Magnesium: 1.8 mg/dL (ref 1.7–2.4)

## 2023-03-04 LAB — PHOSPHORUS: Phosphorus: 2.7 mg/dL (ref 2.5–4.6)

## 2023-03-04 NOTE — Plan of Care (Signed)
  Problem: Clinical Measurements: Goal: Ability to maintain clinical measurements within normal limits will improve Outcome: Progressing Goal: Will remain free from infection Outcome: Progressing Goal: Diagnostic test results will improve Outcome: Progressing Goal: Respiratory complications will improve Outcome: Progressing Goal: Cardiovascular complication will be avoided Outcome: Progressing   Problem: Activity: Goal: Risk for activity intolerance will decrease Outcome: Progressing   Problem: Nutrition: Goal: Adequate nutrition will be maintained Outcome: Progressing   Problem: Coping: Goal: Level of anxiety will decrease Outcome: Progressing   Problem: Elimination: Goal: Will not experience complications related to bowel motility Outcome: Progressing Goal: Will not experience complications related to urinary retention Outcome: Progressing   Problem: Pain Managment: Goal: General experience of comfort will improve Outcome: Progressing   Problem: Safety: Goal: Ability to remain free from injury will improve Outcome: Progressing   Problem: Skin Integrity: Goal: Risk for impaired skin integrity will decrease Outcome: Progressing   Problem: Coping: Goal: Ability to adjust to condition or change in health will improve Outcome: Progressing   Problem: Health Behavior/Discharge Planning: Goal: Compliance with prescribed medication regimen will improve Outcome: Progressing   Problem: Medication: Goal: Risk for medication side effects will decrease Outcome: Progressing   Problem: Clinical Measurements: Goal: Complications related to the disease process, condition or treatment will be avoided or minimized Outcome: Progressing Goal: Diagnostic test results will improve Outcome: Progressing   Problem: Self-Concept: Goal: Level of anxiety will decrease Outcome: Progressing Goal: Ability to verbalize feelings about condition will improve Outcome: Progressing    Problem: Education: Goal: Knowledge of General Education information will improve Description: Including pain rating scale, medication(s)/side effects and non-pharmacologic comfort measures Outcome: Not Progressing Note: Patient experiencing confusion at this time. Will continue to educate and reorient as needed.   Problem: Health Behavior/Discharge Planning: Goal: Ability to manage health-related needs will improve Outcome: Not Progressing Note: Patient experiencing confusion at this time. Will continue to educate and reorient as needed.   Problem: Education: Goal: Expressions of having a comfortable level of knowledge regarding the disease process will increase Outcome: Not Progressing Note: Patient experiencing confusion at this time. Will continue to educate and reorient as needed.   Problem: Coping: Goal: Ability to identify appropriate support needs will improve Outcome: Not Progressing Note: Patient experiencing confusion at this time. Will continue to educate and reorient as needed.   Problem: Safety: Goal: Verbalization of understanding the information provided will improve Outcome: Not Progressing Note: Patient experiencing confusion at this time and patient is nonverbal. Will continue to educate and reorient as needed.

## 2023-03-04 NOTE — Progress Notes (Addendum)
Daily Progress Note   Patient Name: Katherine Fields       Date: 03/04/2023 DOB: 1965/08/18  Age: 57 y.o. MRN#: [id number] Attending Physician: Marcelino Duster, MD Primary Care Physician: Sherol Dade, DO Admit Date: 02/23/2023  Reason for Consultation/Follow-up: Establishing goals of care  Subjective: Notes and labs reviewed.  Spoke with attending team last evening [facility] care.  Spoke with patient's daughter [name] on phone and at bedside.  She advises that she has 3 siblings which have been on the phone during our conversations, but states muted and just listens.    She states they have discussed comfort focused care.  We discussed comfort care and hospice facility versus return to her facility with hospice to follow.  Discussed concern for symptom management needs and care in depth.  Offered support and answer questions as able regarding her questions and concerns in care.  She states she will speak to them regarding care moving forward.  PMT will continue to follow.  Length of Stay: 8  Current Medications: Scheduled Meds:   baclofen  10 mg Oral BID   [START ON 03/05/2023] cyanocobalamin  1,000 mcg Intramuscular Weekly   Followed by   Melene Muller ON 04/02/2023] cyanocobalamin  1,000 mcg Intramuscular Q30 days   enoxaparin (LOVENOX) injection  40 mg Subcutaneous Q24H   gabapentin  100 mg Oral Q8H   multivitamin with minerals  1 tablet Oral Daily   zonisamide  100 mg Oral BID   zonisamide  200 mg Oral QHS    Continuous Infusions:  dextrose 5 % and 0.9 % NaCl Stopped (03/04/23 2952)   Immune Globulin 10% Stopped (03/03/23 1835)   lacosamide (VIMPAT) IV 200 mg (03/04/23 1026)   levETIRAcetam 1,500 mg (03/04/23 1028)   sodium chloride 999 mL/hr  at 03/04/23 8413   And   sodium chloride Stopped (03/03/23 1858)   valproate sodium Stopped (03/04/23 2440)    PRN Meds: acetaminophen, bisacodyl, hydrALAZINE, LORazepam, LORazepam, [DISCONTINUED] ondansetron **OR** ondansetron (ZOFRAN) IV, mouth rinse, oxyCODONE  Physical Exam Pulmonary:     Effort: Pulmonary effort is normal.  Neurological:     Mental Status: She is alert.             Vital Signs: BP (!) 163/92 (BP Location: Right Arm)   Pulse 93  Temp 98.6 F (37 C)   Resp 16   Ht 5\' 6"  (1.676 m)   Wt 60.6 kg Comment: weight with 1 chux pad, blanket, sheet, pillow  LMP  (LMP Unknown)   SpO2 99%   BMI 21.56 kg/m  SpO2: SpO2: 99 % O2 Device: O2 Device: Room Air O2 Flow Rate: O2 Flow Rate (L/min): 0 L/min  Intake/output summary:  Intake/Output Summary (Last 24 hours) at 03/04/2023 1350 Last data filed at 03/04/2023 1134 Gross per 24 hour  Intake 6722.04 ml  Output 2950 ml  Net 3772.04 ml   LBM: Last BM Date : 03/01/23 Baseline Weight: Weight: 68 kg Most recent weight: Weight: 60.6 kg (weight with 1 chux pad, blanket, sheet, pillow)    Patient Active Problem List   Diagnosis Date Noted   Paraplegia (HCC) 08/04/2022   Seizure disorder (HCC) 08/04/2022   Proctocolitis 08/02/2022   Urinary tract infection 08/01/2022   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   G tube feedings (HCC) 08/01/2022   Hypoglycemia 05/09/2022   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   Chronic pain 05/09/2022   UTI (urinary tract infection) 05/09/2022   Essential hypertension 05/09/2022   Functional quadriplegia (HCC) 05/09/2022   Epilepsy, generalized, convulsive (HCC)    Dysphagia    Seizures (HCC) 09/24/2016   Seizure (HCC) 03/14/2016   Dysphagia following cerebral infarction 10/18/2014   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   In Pre-Arrest Conditions (Patient Is Breathing and Has A Pulse) Do not intubate. Provide all appropriate non-invasive medical interventions. Avoid ICU transfer unless indicated or required.   Consent: Discussion documented in EHR or advanced directives reviewed      02/23/23 1520           Code Status History     Date Active Date Inactive Code Status Order ID Comments User Context   08/31/2022 0638 08/31/2022 1439 DNR [id number]  Delton Prairie, MD ED   08/01/2022 0407 08/04/2022 2322 DNR [id number]  Andris Baumann, MD Inpatient   05/09/2022 1413 05/10/2022 1529 DNR [id number]  Lucile Shutters, MD ED   09/24/2016 2035 09/30/2016 2336 Full Code [id number]  Adrian Saran, MD ED   03/14/2016 1537 03/14/2016 1545 Full Code [id number]  Milagros Loll, MD ED      Advance Directive Documentation    Flowsheet Row Most Recent Value  Type of Advance Directive Out of facility DNR (pink MOST or yellow form)  Pre-existing out of facility DNR order (yellow form or pink MOST form) Pink MOST form placed in chart (order not valid for inpatient use)  "MOST" Form in Place? --       Prognosis: Poor   Care plan was discussed with attending  Thank you for allowing the Palliative Medicine Team to assist in the care of this patient.   Morton Stall, NP  Please contact Palliative Medicine Team phone at [phone] for questions and concerns.

## 2023-03-04 NOTE — Plan of Care (Signed)

## 2023-03-04 NOTE — Progress Notes (Signed)
Progress Note   Patient: Katherine Fields WUJ:[id number] DOB: January 10, 1966 DOA: 02/23/2023     8 DOS: the patient was seen and examined on 03/04/2023   Brief hospital course:   Assessment and Plan: # Breakthrough seizure Given dysphagia, question not swallowing meds well might be a reason for breakthrough seizures?  EEG normal (post/inter-interictal) ED physician discussed case patient's neurology in [facility], who recommended continue current treatment. Neurology consulted, recommended following medications, patient received IV meds which were switched to oral and again switched to IV as patient is having intermittent seizures and unable to take oral meds 9/28 continue Keppra 1500 mg IV twice daily, Depacon 250 mg IV every 6 hourly, Vimpat 200 mg IV twice daily, continue zonisamide 100 mg p.o. twice daily and 200 mg p.o. nightly Continue gabapentin 100 mg 3 times daily and Baclofen 10 mg p.o. 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  9/29 d/w neurologist, recommended to continue IVIG for 5 days, if she has recurrent seizures then transferred to tertiary care center for LTM EEG  monitoring otherwise we can try to manage here. If no seizures then patient can be discharged to long-term care facility after IVIG 9/30 patient is a reluctant to take oral medications, we will continue IV meds and IVIG.  If no improvement then palliative care will be consulted again to discuss goals of care for possible hospice care.   10/1 discussed with neurology, patient will benefit from LTM EEG monitoring to titrate antiseizure medication accordingly. I called [facility] again and discussed with neurologist Dr. Dan Humphreys who accepted the patient to be transferred under their service.  I informed to patient's daughter and she said okay but she needs to discuss with other sisters and she will get back to me I got information by RN that patient's daughter wanted to cancel transfer and move forward to comfort measures only. Patient family wishes to transfer her back to facility with hospice care only. Palliative care on board, discussed with family.  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  On pured diet at baseline, seen by SLP, started on pured diet again on 9/25     HTN BP initially low, held home lisinopril and metoprolol d/t cannot take po, hydralazine prn   Vitamin B12 deficiency: Started vitamin B12 1000 mcg IM injection.  Out of bed to chair. Incentive spirometry. Nursing supportive care. Fall, aspiration precautions. DVT prophylaxis   Code Status: Limited: Do not attempt resuscitation (DNR) -DNR-LIMITED -Do Not Intubate/DNI   Subjective: Patient is seen and examined today  morning. She is poor historian due to her mental status. No overnight issues. Family chose comfort and hospice care upon discharge back to facility  Physical Exam: Vitals:   03/04/23 1623 03/04/23 1640 03/04/23 1727 03/04/23 1940  BP: (!) 151/84 (!) 143/91 (!) 148/80 132/68  Pulse:    79  Resp: (!) 24 19 19 20   Temp:    98.5 F (36.9 C)  TempSrc:    Oral  SpO2:    100%  Weight:      Height:        General - Middle aged [place] American female, does not follow commands. HEENT - PERRLA, EOMI, atraumatic head, non tender sinuses. Lung - Clear, decreased, diffuse rales, rhonchi, no wheezes. Heart - S1, S2 heard, no murmurs, rubs, trace pedal edema. Abdomen - Soft, non tender, bowel sounds good Neuro - Alert, does not follow commands, unable to do full neuro exam. Skin - Warm and dry.  Data Reviewed:      Latest Ref Rng & Units 03/04/2023    5:45 AM 03/03/2023    9:16 AM 03/02/2023    5:08 AM  CBC  WBC 4.0 - 10.5 K/uL 3.2  3.3  3.1   Hemoglobin 12.0 - 15.0 g/dL 25.9  56.3  87.5   Hematocrit 36.0 - 46.0 % 30.5  32.2  33.4   Platelets 150 - 400 K/uL 220  205  214       Latest Ref Rng & Units 03/04/2023    5:45 AM 03/03/2023    9:16 AM 03/02/2023    5:08 AM  BMP  Glucose 70 - 99 mg/dL 75  67  78   BUN 6 - 20 mg/dL 14  13  11    Creatinine 0.44 - 1.00 mg/dL 6.43  3.29  5.18   Sodium 135 - 145 mmol/L 135  134  137   Potassium 3.5 - 5.1 mmol/L 3.2  3.6  3.4   Chloride 98 - 111 mmol/L 108  107  109   CO2 22 - 32 mmol/L 20  20  19    Calcium 8.9 - 10.3 mg/dL 8.8  9.1  9.1    No results found.   Family Communication: Discussed with family, they understand and agree. All questions answereed.    Disposition: Status is: Inpatient Remains inpatient appropriate because: comfort care only  Planned Discharge Destination:  hospice facility     Time spent: 39 minutes  Author: Marcelino Duster, MD 03/04/2023 10:40 PM Secure chat 7am to 7pm For on call review  [email or web address].

## 2023-03-05 DIAGNOSIS — G2582 Stiff-man syndrome: Secondary | ICD-10-CM

## 2023-03-05 DIAGNOSIS — G40919 Epilepsy, unspecified, intractable, without status epilepticus: Secondary | ICD-10-CM | POA: Diagnosis not present

## 2023-03-05 DIAGNOSIS — I69359 Hemiplegia and hemiparesis following cerebral infarction affecting unspecified side: Secondary | ICD-10-CM

## 2023-03-05 DIAGNOSIS — Z515 Encounter for palliative care: Secondary | ICD-10-CM | POA: Diagnosis not present

## 2023-03-05 DIAGNOSIS — R569 Unspecified convulsions: Secondary | ICD-10-CM | POA: Diagnosis not present

## 2023-03-05 DIAGNOSIS — N1831 Chronic kidney disease, stage 3a: Secondary | ICD-10-CM

## 2023-03-05 DIAGNOSIS — Z7189 Other specified counseling: Secondary | ICD-10-CM | POA: Diagnosis not present

## 2023-03-05 MED ORDER — LORAZEPAM 2 MG/ML IJ SOLN: 2.0000 mg | INTRAMUSCULAR | 0 refills | Status: AC | PRN

## 2023-03-05 MED ORDER — SODIUM CHLORIDE 0.9 % IV SOLN: INTRAVENOUS | Status: DC | PRN

## 2023-03-05 MED ORDER — FLEET PEDIATRIC 3.5-9.5 GM/59ML RE ENEM: 1.0000 | ENEMA | Freq: Once | RECTAL | Status: AC | PRN

## 2023-03-05 MED FILL — Immune Globulin (Human) IV Soln 20 GM/200ML: INTRAVENOUS | Qty: 200 | Status: AC

## 2023-03-05 MED FILL — Immune Globulin (Human) IV Soln 5 GM/50ML: INTRAVENOUS | Qty: 50 | Status: AC

## 2023-03-05 NOTE — TOC Progression Note (Signed)
Transition of Care [facility]) - Progression Note    Patient Details  Name: Nyleah Mcginnis MRN: [id number] Date of Birth: 04-15-1966  Transition of Care [facility]) CM/SW Contact  Truddie Hidden, RN Phone Number: 03/05/2023, 3:09 PM  Clinical Narrative:    Per Palliative NP patient's family is requesting hospice. RNCM spoke with family at bedside regarding hospice. Her daughter was present and another daughter was on the phone as wwe spoke. Patient's daughter's expressed wish for IPU at [facility] They have been advised patient referral would be made to [facility]. RNCM also advised patient would have to meet inpatient criteria. RNCM advised patient may likely transfer today if that criteria is met and a bed is available. They verbalized their understanding and wished to proceed.   Referral made to [facility] from [facility].         Expected Discharge Plan and Services         Expected Discharge Date: 03/05/23                                     Social Determinants of Health (SDOH) Interventions SDOH Screenings   Food Insecurity: Patient Unable To Answer (02/24/2023)  Housing: Patient Unable To Answer (02/24/2023)  Transportation Needs: Patient Unable To Answer (02/24/2023)  Utilities: Patient Unable To Answer (02/24/2023)  Financial Resource Strain: Low Risk  (07/28/2021)   Received from [facility]  Tobacco Use: Low Risk  (02/24/2023)    Readmission Risk Interventions     No data to display

## 2023-03-05 NOTE — Progress Notes (Signed)
Patient with noted seizure movement, foaming at mouth and increased heart rate--150s. Ativan 2mg  IV given as ordered and resolved. Dr. Arville Care notified. Patient heart rate 100s and no seizure movements noted.

## 2023-03-05 NOTE — Plan of Care (Signed)
Discharging to [facility].   Problem: Education: Goal: Knowledge of General Education information will improve Description: Including pain rating scale, medication(s)/side effects and non-pharmacologic comfort measures Outcome: Adequate for Discharge   Problem: Health Behavior/Discharge Planning: Goal: Ability to manage health-related needs will improve Outcome: Adequate for Discharge   Problem: Clinical Measurements: Goal: Ability to maintain clinical measurements within normal limits will improve Outcome: Adequate for Discharge Goal: Will remain free from infection Outcome: Adequate for Discharge Goal: Diagnostic test results will improve Outcome: Adequate for Discharge Goal: Respiratory complications will improve Outcome: Adequate for Discharge Goal: Cardiovascular complication will be avoided Outcome: Adequate for Discharge   Problem: Activity: Goal: Risk for activity intolerance will decrease Outcome: Adequate for Discharge   Problem: Nutrition: Goal: Adequate nutrition will be maintained Outcome: Adequate for Discharge   Problem: Coping: Goal: Level of anxiety will decrease Outcome: Adequate for Discharge   Problem: Elimination: Goal: Will not experience complications related to bowel motility Outcome: Adequate for Discharge Goal: Will not experience complications related to urinary retention Outcome: Adequate for Discharge   Problem: Pain Managment: Goal: General experience of comfort will improve Outcome: Adequate for Discharge   Problem: Safety: Goal: Ability to remain free from injury will improve Outcome: Adequate for Discharge   Problem: Skin Integrity: Goal: Risk for impaired skin integrity will decrease Outcome: Adequate for Discharge   Problem: Education: Goal: Expressions of having a comfortable level of knowledge regarding the disease process will increase Outcome: Adequate for Discharge   Problem: Coping: Goal: Ability to adjust to  condition or change in health will improve Outcome: Adequate for Discharge Goal: Ability to identify appropriate support needs will improve Outcome: Adequate for Discharge   Problem: Health Behavior/Discharge Planning: Goal: Compliance with prescribed medication regimen will improve Outcome: Adequate for Discharge   Problem: Medication: Goal: Risk for medication side effects will decrease Outcome: Adequate for Discharge   Problem: Clinical Measurements: Goal: Complications related to the disease process, condition or treatment will be avoided or minimized Outcome: Adequate for Discharge Goal: Diagnostic test results will improve Outcome: Adequate for Discharge   Problem: Safety: Goal: Verbalization of understanding the information provided will improve Outcome: Adequate for Discharge   Problem: Self-Concept: Goal: Level of anxiety will decrease Outcome: Adequate for Discharge Goal: Ability to verbalize feelings about condition will improve Outcome: Adequate for Discharge

## 2023-03-05 NOTE — Discharge Summary (Signed)
Physician Discharge Summary   Patient: Katherine Fields MRN: [id number] DOB: June 22, 1965  Admit date:     02/23/2023  Discharge date: 03/05/23  Discharge Physician: Marcelino Duster   PCP: Sherol Dade, DO   Recommendations at discharge:    Hospice care only.  Discharge Diagnoses: Principal Problem:   Seizure [facility]) Active Problems:   Seizures (HCC)  Resolved Problems:   * No resolved hospital problems. *  Hospital Course: Katherine Fields is a 57 y.o. 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    Of note: recent outpatient neurology visit at [facility] last Friday 09/20, concern for elevated GAD Ab as cause for seizures, patient was referred to see neuro immunology clinic for possible intervention. Rx recs at that time: increasing Keppra from 1000 twice daily to 1250 twice daily;continue Depakote, Vimpat gabapentin and zonisamide same doses  Hospital course / significant events:  09/23: received Ativan en route w/ EMS, postictal/sedated in ED. Labs unremarkable. CT head no acute findings. UA showed WBC 11-20. [facility] neurology physician was contacted by [facility] ED physician - they recommended increase Keppra to 1500 mg twice daily, Depakote 500 mg twice daily and zonisamide to 200 mg twice daily. Admitting hospitalist left messages to patient daughter.   09/24: seizure overnight per RN requiring IV ativan. This morning, daughters state she is about at her baseline. SLP eval - essentially no swallow function, switched all meds to IV for now, if not recovering may need to consider palliative consult. 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 Started IV IgG 25 g IV daily for 5  days along with pre and post NS for hydration to prevent AKI. 9/28 as per neuro patient will benefit from LTM EEG monitoring, family requested to transfer     to [place], currently [facility] is at capacity and cannot keep the patient on the waiting list.  9/29 d/w neurologist, recommended to continue IVIG for 5 days, if she has recurrent seizures then transferred to tertiary care center for LTM EEG monitoring otherwise we can try to manage here. If no seizures then patient can be discharged to long-term care facility after IVIG 9/30 patient is a reluctant to take oral medications, we will continue IV meds and IVIG. If no improvement then palliative care will be consulted again to discuss goals of care for possible hospice care.  10/1 discussed with neurology, patient will benefit from LTM EEG monitoring to titrate antiseizure medication accordingly.  Patient family decided to make her comfort care only and wished to transfer back to facility with hospice care. 10/2 Palliative discussed with family, who agreed with hospice care.  10/3 - Overnight seizure episode, had to start her on anti seizure IV meds. She cannot go back to her facility. Hospice liaison spoke to family and they agreed for hospice home transfer.     Consultants:  Neurology, Palliative care   Procedures/Surgeries: None        Disposition: Hospice care Diet recommendation:  NPO pleasure feeds DISCHARGE MEDICATION: Allergies as of 03/05/2023       Reactions   Tramadol    Other reaction(s): Unknown        Medication List     STOP taking these medications    acetaminophen 500 MG tablet Commonly known  as: TYLENOL   atorvastatin 40 MG tablet Commonly known as: LIPITOR   Baclofen 5 MG Tabs   bisacodyl 10 MG suppository Commonly known as: DULCOLAX   cetirizine 10 MG tablet Commonly known as: ZYRTEC   divalproex 125 MG capsule Commonly known as: DEPAKOTE SPRINKLE   DULoxetine 20 MG capsule Commonly known as:  CYMBALTA   DULoxetine HCl 40 MG Cpep   esomeprazole 20 MG capsule Commonly known as: NEXIUM   gabapentin 100 MG capsule Commonly known as: NEURONTIN   Glucagon Emergency 1 MG Kit   guaiFENesin 100 MG/5ML liquid Commonly known as: ROBITUSSIN   lacosamide 200 MG Tabs tablet Commonly known as: VIMPAT   lactulose 10 GM/15ML solution Commonly known as: CHRONULAC   levETIRAcetam 100 MG/ML solution Commonly known as: KEPPRA   levETIRAcetam 1000 MG tablet Commonly known as: KEPPRA   levETIRAcetam 250 MG tablet Commonly known as: KEPPRA   lisinopril 20 MG tablet Commonly known as: ZESTRIL   metoprolol tartrate 25 MG tablet Commonly known as: LOPRESSOR   ondansetron 4 MG tablet Commonly known as: ZOFRAN   One-Daily Multi-Vit/Mineral Tabs   oxyCODONE 5 MG immediate release tablet Commonly known as: Oxy IR/ROXICODONE   pantoprazole 40 MG tablet Commonly known as: PROTONIX   promethazine 25 MG/ML injection Commonly known as: PHENERGAN   senna 8.6 MG Tabs tablet Commonly known as: SENOKOT   Vitamin D (Cholecalciferol) 25 MCG (1000 UT) Tabs   zonisamide 100 MG capsule Commonly known as: ZONEGRAN       TAKE these medications    ammonium lactate 12 % cream Commonly known as: AMLACTIN Apply 1 Application topically at bedtime. Apply to bilateral feet and legs for dry skin   Deep Sea Nasal Spray 0.65 % nasal spray Generic drug: sodium chloride Place 1 spray into the nose every 4 (four) hours as needed.   LORazepam 0.5 MG tablet Commonly known as: ATIVAN Take 1 tablet (0.5 mg total) by mouth 2 (two) times daily as needed for anxiety. What changed: when to take this   LORazepam 2 MG/ML injection Commonly known as: ATIVAN Inject 1 mg into the muscle once. Only use if having a seizer What changed:  Another medication with the same name was added. Make sure you understand how and when to take each. Another medication with the same name was changed. Make sure  you understand how and when to take each.   LORazepam 2 MG/ML injection Commonly known as: ATIVAN Inject 1 mL (2 mg total) into the vein every 5 (five) minutes x 3 doses as needed for seizure. What changed: You were already taking a medication with the same name, and this prescription was added. Make sure you understand how and when to take each.   sodium phosphate Pediatric 3.5-9.5 GM/59ML enema Place 66 mLs (1 enema total) rectally once as needed for up to 1 dose for severe constipation. Only Tuesday and Wednesday What changed:  when to take this reasons to take this        Discharge Exam: Filed Weights   02/23/23 1225 03/02/23 0351  Weight: 68 kg 60.6 kg   General - Middle aged [place] American female, does not follow commands. HEENT - PERRLA, EOMI, atraumatic head, non tender sinuses. Lung - Clear, decreased, diffuse rales, rhonchi, no wheezes. Heart - S1, S2 heard, no murmurs, rubs, trace pedal edema. Abdomen - Soft, non tender, bowel sounds good Neuro - does not follow commands, unable to do full neuro exam. Skin - Warm and dry.  Condition at discharge: poor  The results of significant diagnostics from this hospitalization (including imaging, microbiology, ancillary and laboratory) are listed below for reference.   Imaging Studies: EEG adult  Result Date: 03-26-2023 Katherine Quest, MD     March 26, 2023  4:46 PM Patient Name: Katherine Fields MRN: [id number] Epilepsy Attending: Charlsie Fields Referring Physician/Provider: Rejeana Brock, MD Date: Mar 26, 2023 Duration: 23.44 mins Patient history: 57yo f with ams getting eeg to evaluate for seizure Level of alertness: Awake AEDs during EEG study: LEV, LCM, VP, ZNS, GBP Technical aspects: This EEG study was done with scalp electrodes positioned according to the 10-20 International system of electrode placement. Electrical activity was reviewed with band pass filter of 1-70Hz , sensitivity of 7 uV/mm, display speed of  72mm/sec with a 60Hz  notched filter applied as appropriate. EEG data were recorded continuously and digitally stored.  Video monitoring was available and reviewed as appropriate. Description: The posterior dominant rhythm consists of 8 Hz activity of moderate voltage (25-35 uV) seen predominantly in posterior head regions, symmetric and reactive to eye opening and eye closing. EEG showed intermittent generalized 5 to 7 Hz theta slowing. Physiologic photic driving was not seen during photic stimulation.  Hyperventilation was not performed.   ABNORMALITY - Intermittent slow, generalized IMPRESSION: This study is suggestive of mild diffuse encephalopathy. No seizures or epileptiform discharges were seen throughout the recording. Katherine Fields   DG Shoulder Left  Result Date: 02/27/2023 CLINICAL DATA:  Left shoulder dislocation. EXAM: LEFT SHOULDER - 2+ VIEW COMPARISON:  Chest radiograph dated 02/25/2023. FINDINGS: Evaluation is very limited due to patient's positioning and in the absence of an axial view. There is overlapping of the medial humeral head over the glenoid on the frontal view which may be positional or represent a degree of posterior dislocation. On the Y-view however the radial head appears in alignment with the glenoid. No acute fracture. The bones are osteopenic. The soft tissues are unremarkable. IMPRESSION: Possible degree of posterior shoulder dislocation. An axillary view recommended for better evaluation. Electronically Signed   By: Elgie Collard M.D.   On: 02/27/2023 16:19   DG Chest Port 1 View  Result Date: 02/25/2023 CLINICAL DATA:  Seizure EXAM: PORTABLE CHEST 1 VIEW COMPARISON:  02/23/2023 FINDINGS: Normal heart size and mediastinal contours. No acute infiltrate or edema. No effusion or pneumothorax. No acute osseous findings. IMPRESSION: No active disease. Electronically Signed   By: Tiburcio Pea M.D.   On: 02/25/2023 08:00   EEG adult  Result Date: 02/24/2023 Katherine Quest, MD     02/24/2023  1:09 PM Patient Name: Katherine Fields MRN: [id number] Epilepsy Attending: Charlsie Fields Referring Physician/Provider: Emeline General, MD Date: 02/24/2023 Duration: 31.04 mins Patient history: 57yo F with epilepsy presented with breakthrough seizure getting eeg to evaluate for seizure Level of alertness: Awake AEDs during EEG study: LEV, LCM, VPA, GBP Technical aspects: This EEG study was done with scalp electrodes positioned according to the 10-20 International system of electrode placement. Electrical activity was reviewed with band pass filter of 1-70Hz , sensitivity of 7 uV/mm, display speed of 22mm/sec with a 60Hz  notched filter applied as appropriate. EEG data were recorded continuously and digitally stored.  Video monitoring was available and reviewed as appropriate. Description: The posterior dominant rhythm consists of 8-9 Hz activity of moderate voltage (25-35 uV) seen predominantly in posterior head regions, symmetric and reactive to eye opening and eye closing. Hyperventilation and photic stimulation were not performed.   IMPRESSION: This study  is within normal limits. No seizures or epileptiform discharges were seen throughout the recording. A normal interictal EEG does not exclude the diagnosis of epilepsy. Katherine Fields   CT HEAD WO CONTRAST ( )  Result Date: 02/23/2023 CLINICAL DATA:  Mental status change EXAM: CT HEAD WITHOUT CONTRAST TECHNIQUE: Contiguous axial images were obtained from the base of the skull through the vertex without intravenous contrast. RADIATION DOSE REDUCTION: This exam was performed according to the departmental dose-optimization program which includes automated exposure control, adjustment of the mA and/or kV according to patient size and/or use of iterative reconstruction technique. COMPARISON:  Head CT 12/26/2022.  MRI head 08/06/2027. FINDINGS: Brain: No evidence of acute infarction, hemorrhage, hydrocephalus, or extra-axial fluid  collection. Again seen is a moderate-sized focal area of chronic encephalomalacia/gliosis in the left frontal lobe. There is ex vacuo dilatation of the adjacent frontal horn of the left lateral ventricle, unchanged. Vascular: No hyperdense vessel or unexpected calcification. Skull: Normal. Negative for fracture or focal lesion. Sinuses/Orbits: No acute finding. Other: None. IMPRESSION: 1. No acute intracranial abnormality. 2. Stable moderate-sized focal area of chronic encephalomalacia/gliosis in the left frontal lobe. Electronically Signed   By: Darliss Cheney M.D.   On: 02/23/2023 15:54   DG Chest Portable 1 View  Result Date: 02/23/2023 CLINICAL DATA:  Seizures EXAM: PORTABLE CHEST - 1 VIEW COMPARISON:  08/31/2022 FINDINGS: Mild left retrocardiac atelectasis/consolidation.  Right lung clear. Heart size and mediastinal contours are within normal limits. No effusion. Visualized bones unremarkable. IMPRESSION: Left retrocardiac atelectasis/consolidation. Electronically Signed   By: Corlis Leak M.D.   On: 02/23/2023 14:56    Microbiology: Results for orders placed or performed during the hospital encounter of 02/23/23  MRSA Next Gen by PCR, Nasal     Status: None   Collection Time: 02/24/23  2:15 PM   Specimen: Nasal Mucosa; Nasal Swab  Result Value Ref Range Status   MRSA by PCR Next Gen NOT DETECTED NOT DETECTED Final    Comment: (NOTE) The GeneXpert MRSA Assay (FDA approved for NASAL specimens only), is one component of a comprehensive MRSA colonization surveillance program. It is not intended to diagnose MRSA infection nor to guide or monitor treatment for MRSA infections. Test performance is not FDA approved in patients less than 39 years old. Performed at [facility], [address] [place], [place] [address]     Labs: CBC: Recent Labs  Lab 02/28/23 0530 03/01/23 1914 03/02/23 0508 03/03/23 0916 03/04/23 0545  WBC 4.7 3.5* 3.1* 3.3* 3.2*  NEUTROABS  --   --  2.1 1.7  1.6*  HGB 11.0* 10.6* 11.5* 11.0* 10.4*  HCT 30.7* 30.2* 33.4* 32.2* 30.5*  MCV 102.3* 102.0* 103.1* 104.5* 104.8*  PLT 212 190 214 205 220   Basic Metabolic Panel: Recent Labs  Lab 02/27/23 0520 02/28/23 0530 03/01/23 0605 03/02/23 0508 03/03/23 0916 03/04/23 0545  NA 138 140 136 137 134* 135  K 3.4* 3.4* 3.3* 3.4* 3.6 3.2*  CL 107 111 108 109 107 108  CO2 21* 21* 20* 19* 20* 20*  GLUCOSE 78 83 76 78 67* 75  BUN 11 8 8 11 13 14   CREATININE 0.91 0.83 0.77 0.88 0.89 0.91  CALCIUM 9.3 9.4 8.8* 9.1 9.1 8.8*  MG 1.9 2.0  --  1.8 1.9 1.8  PHOS 3.1 3.1  --  2.8 3.0 2.7   Liver Function Tests: No results for input(s): "AST", "ALT", "ALKPHOS", "BILITOT", "PROT", "ALBUMIN" in the last 168 hours. CBG: Recent Labs  Lab 02/27/23 2147  GLUCAP 78    Discharge time spent: 43 minutes.  Signed: Marcelino Duster, MD [facility] 03/05/2023

## 2023-03-05 NOTE — Progress Notes (Signed)
Nursing Discharge Note  Name: Katherine Fields MRN: [id number] DOB: [birth year]  Admit Date: 02/23/2023 Discharge Date: 03/05/2023  Katherine Fields is  to be discharged to an inpatient hospice facility. AVS completed, placed in discharge packet for facility review. Discharge packet compiled for facility. Non-emergency ambulance transport arranged. Report called to [place], LPN at [facility] [phone].   Allergies as of 03/05/2023       Reactions   Tramadol    Other reaction(s): Unknown        Medication List     STOP taking these medications    acetaminophen 500 MG tablet Commonly known as: TYLENOL   atorvastatin 40 MG tablet Commonly known as: LIPITOR   Baclofen 5 MG Tabs   bisacodyl 10 MG suppository Commonly known as: DULCOLAX   cetirizine 10 MG tablet Commonly known as: ZYRTEC   divalproex 125 MG capsule Commonly known as: DEPAKOTE SPRINKLE   DULoxetine 20 MG capsule Commonly known as: CYMBALTA   DULoxetine HCl 40 MG Cpep   esomeprazole 20 MG capsule Commonly known as: NEXIUM   gabapentin 100 MG capsule Commonly known as: NEURONTIN   Glucagon Emergency 1 MG Kit   guaiFENesin 100 MG/5ML liquid Commonly known as: ROBITUSSIN   lacosamide 200 MG Tabs tablet Commonly known as: VIMPAT   lactulose 10 GM/15ML solution Commonly known as: CHRONULAC   levETIRAcetam 100 MG/ML solution Commonly known as: KEPPRA   levETIRAcetam 1000 MG tablet Commonly known as: KEPPRA   levETIRAcetam 250 MG tablet Commonly known as: KEPPRA   lisinopril 20 MG tablet Commonly known as: ZESTRIL   metoprolol tartrate 25 MG tablet Commonly known as: LOPRESSOR   ondansetron 4 MG tablet Commonly known as: ZOFRAN   One-Daily Multi-Vit/Mineral Tabs   oxyCODONE 5 MG immediate release tablet Commonly known as: Oxy IR/ROXICODONE   pantoprazole 40 MG tablet Commonly known as: PROTONIX   promethazine 25 MG/ML injection Commonly known as: PHENERGAN   senna 8.6 MG  Tabs tablet Commonly known as: SENOKOT   Vitamin D (Cholecalciferol) 25 MCG (1000 UT) Tabs   zonisamide 100 MG capsule Commonly known as: ZONEGRAN       TAKE these medications    ammonium lactate 12 % cream Commonly known as: AMLACTIN Apply 1 Application topically at bedtime. Apply to bilateral feet and legs for dry skin   Deep Sea Nasal Spray 0.65 % nasal spray Generic drug: sodium chloride Place 1 spray into the nose every 4 (four) hours as needed.   LORazepam 0.5 MG tablet Commonly known as: ATIVAN Take 1 tablet (0.5 mg total) by mouth 2 (two) times daily as needed for anxiety. What changed: when to take this   LORazepam 2 MG/ML injection Commonly known as: ATIVAN Inject 1 mg into the muscle once. Only use if having a seizer What changed:  Another medication with the same name was added. Make sure you understand how and when to take each. Another medication with the same name was changed. Make sure you understand how and when to take each.   LORazepam 2 MG/ML injection Commonly known as: ATIVAN Inject 1 mL (2 mg total) into the vein every 5 (five) minutes x 3 doses as needed for seizure. What changed: You were already taking a medication with the same name, and this prescription was added. Make sure you understand how and when to take each.   sodium phosphate Pediatric 3.5-9.5 GM/59ML enema Place 66 mLs (1 enema total) rectally once as needed for up to 1 dose for severe  constipation. Only Tuesday and Wednesday What changed:  when to take this reasons to take this             [facility] to provide transportation to facility for patient. Non-emergency ambulance transport at bedside. Handoff completed with [facility] staff/EMTs.   Patient discharged from hospital unit via stretcher. Stable at time of discharge. Discharged with right forearm 22g IV and left forearm 22g IV as requested by hospice facility to leave in place.

## 2023-03-05 NOTE — Progress Notes (Signed)
[facility]- Civil engineer, contracting    Received request from Transitions of Care Manger for family interest in [facility].  Eligibility has been confirmed.  Spoke with daughter, [name],  to confirm interest and explain services.  Family agreeable to transfer today.  Transitions of Care manager aware.  RN please call report to [phone] [facility]) prior to patient leaving the unit.  Please send signed and completed DNR with patient at discharge.  Thank you  Redge Gainer,  [facility] Liaison [phone]

## 2023-03-05 NOTE — Progress Notes (Addendum)
Daily Progress Note   Patient Name: Katherine Fields       Date: 03/05/2023 DOB: 1966/04/19  Age: 57 y.o. MRN#: [id number] Attending Physician: Marcelino Duster, MD Primary Care Physician: Sherol Dade, DO Admit Date: 02/23/2023  Reason for Consultation/Follow-up: Establishing goals of care  Subjective: Notes and labs reviewed; patient had a seizure this morning.  In to see patient.  Patient is resting in bed with no distress noted looking out the window, nonverbal at baseline.  Called to speak with daughter [name].  She states her sisters except for 1 is on board with transfer to the hospice facility.  She states the fourth sister would like for her to return to [facility] with hospice to follow there.  She called to get sister [name] on conference call.  Sheara.  Asked questions which I answered to the best of my ability.  Question of LTC facility's ability to provide patient's care.  Tension arose between the sisters, and [name] disconnected from the phone call.  Ronta advises that [name] will be coming into the hospital.  Spoke with a member of TOC to obtain phone number to reach out to [facility]. Called number provided to speak with LTC admissions coordinator Debra.  Debra connected me to Caryl Asp at the LTC facility.  Upon discussing patient's case and current medication regimen, she advises they will not be able to provide the care needed.  Into bedside to meet with [facility].  Attending at bedside upon my arrival.  Joined conversation.  Updated family on my conversation with patient's LTC facility. [name] shares that she and Tamera Punt both work at [employer], and are satisfied with the information I relayed to them.   We discussed her diagnoses, current  care, poor prognosis, GOC, EOL wishes disposition and options.  We discussed patient's poor oral intake, need for D 5% IV hydration, current care, and breakthrough seizures.  Created space and opportunity for patient  to explore thoughts and feelings regarding current medical information.   A detailed discussion was had today regarding advanced directives.  Concepts specific to code status, artifical feeding and hydration, IV antibiotics and rehospitalization were discussed.  The difference between an aggressive medical intervention path and a comfort care path was discussed.  Values and goals of care important to patient and family  were attempted to be elicited.  Discussed limitations of medical interventions to prolong quality of life in some situations and discussed the concept of human mortality.  [name] discusses that her mother has pulled out PEG tubes and NG tube previously.  She confirms a desire for transition to comfort care.  The daughter states they would like to proceed with hospice facility placement, and would like [facility] hospice facility.  [name] was able to conference call with her daughter Tamera Punt who agrees with plan for comfort care and hospice facility placement.  TOC arrived to room and was updated.   I completed a MOST form today with Sheara present and Ronta on speaker phone, patient and the signed original was placed in the chart. Each section of options on the form were reviewed in full detail and any questions were answered as needed. The form was scanned and sent to medical records for it to be uploaded under ACP tab in Epic. A photocopy was also placed in the chart to be scanned into EMR. The patient outlined their wishes for the following treatment decisions:  Cardiopulmonary Resuscitation: Do Not Attempt Resuscitation (DNR/No CPR)  Medical Interventions: Comfort Measures: Keep clean, warm, and dry. Use medication by any route, positioning, wound care, and other  measures to relieve pain and suffering. Use oxygen, suction and manual treatment of airway obstruction as needed for comfort. Do not transfer to the hospital unless comfort needs cannot be met in current location.  Antibiotics: No antibiotics (use other measures to relieve symptoms)  IV Fluids: No IV fluids (provide other measures to ensure comfort)  Feeding Tube: No feeding tube       Length of Stay: 9  Current Medications: Scheduled Meds:   baclofen  10 mg Oral BID   cyanocobalamin  1,000 mcg Intramuscular Weekly   Followed by   Melene Muller ON 04/02/2023] cyanocobalamin  1,000 mcg Intramuscular Q30 days   gabapentin  100 mg Oral Q8H   multivitamin with minerals  1 tablet Oral Daily   zonisamide  100 mg Oral BID   zonisamide  200 mg Oral QHS    Continuous Infusions:  sodium chloride 10 mL/hr at 03/05/23 1000   sodium chloride 10 mL/hr at 03/05/23 1016   dextrose 5 % and 0.9 % NaCl Stopped (03/04/23 1612)   lacosamide (VIMPAT) IV 200 mg (03/05/23 1001)   levETIRAcetam 1,500 mg (03/05/23 1019)   valproate sodium 250 mg (03/05/23 1118)    PRN Meds: sodium chloride, sodium chloride, acetaminophen, bisacodyl, hydrALAZINE, LORazepam, LORazepam, [DISCONTINUED] ondansetron **OR** ondansetron (ZOFRAN) IV, mouth rinse, oxyCODONE  Physical Exam Pulmonary:     Effort: Pulmonary effort is normal.  Neurological:     Mental Status: She is alert.             Vital Signs: BP (!) 161/93 (BP Location: Right Arm)   Pulse 84   Temp 97.6 F (36.4 C) (Oral)   Resp 20   Ht 5\' 6"  (1.676 m)   Wt 60.6 kg Comment: weight with 1 chux pad, blanket, sheet, pillow  LMP  (LMP Unknown)   SpO2 100%   BMI 21.56 kg/m  SpO2: SpO2: 100 % O2 Device: O2 Device: Room Air O2 Flow Rate: O2 Flow Rate (L/min): 0 L/min  Intake/output summary:  Intake/Output Summary (Last 24 hours) at 03/05/2023 1227 Last data filed at 03/05/2023 1124 Gross per 24 hour  Intake 1124.47 ml  Output 1850 ml  Net -725.53 ml    LBM: Last BM Date : 03/01/23  Baseline Weight: Weight: 68 kg Most recent weight: Weight: 60.6 kg (weight with 1 chux pad, blanket, sheet, pillow)         Patient Active Problem List   Diagnosis Date Noted   Paraplegia (HCC) 08/04/2022   Seizure disorder (HCC) 08/04/2022   Proctocolitis 08/02/2022   Urinary tract infection 08/01/2022   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   G tube feedings (HCC) 08/01/2022   Hypoglycemia 05/09/2022   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   Given patient's current medication requirements and anticipated symptom management burden, I recommend hospice facility placement. Continue IV antiepileptic agents.  Continue oral antiepileptic agents as long as patient can safely swallow them.  Continue other medications which patient would be uncomfortable without.   Code Status:    Code Status Orders  (From admission, onward)           Start     Ordered   02/23/23 1520  Do not attempt resuscitation (DNR)- Limited -Do Not Intubate (DNI)  Continuous       Question Answer Comment  If pulseless and not breathing No CPR or chest compressions.   In Pre-Arrest Conditions (Patient Is Breathing and Has A Pulse) Do not intubate. Provide all appropriate non-invasive medical interventions. Avoid ICU transfer unless indicated or required.   Consent: Discussion documented in EHR or advanced directives reviewed       02/23/23 1520           Code Status History     Date Active Date Inactive Code Status Order ID Comments User Context   08/31/2022 0638 08/31/2022 1439 DNR [id number]  Delton Prairie, MD ED   08/01/2022 0407 08/04/2022 2322 DNR [id number]  Andris Baumann, MD Inpatient   05/09/2022 1413 05/10/2022 1529 DNR [id number]  Lucile Shutters, MD ED   09/24/2016 2035 09/30/2016 2336 Full Code [id number]  Adrian Saran, MD ED   03/14/2016 1537 03/14/2016 1545 Full Code [id number]  Milagros Loll, MD ED      Advance Directive Documentation    Flowsheet Row Most Recent Value  Type of Advance Directive Out of facility DNR (pink MOST or yellow form)  Pre-existing out of facility DNR order (yellow form or pink MOST form) Pink MOST form placed in chart (order not valid for inpatient use)  "MOST" Form in Place? --       Prognosis:  < 2 weeks   Care plan was discussed with attending  Thank you for allowing the Palliative Medicine Team to assist in the care of this patient.  10:00-11:15= 65 min 11:40- 12:20= 105 min    Morton Stall, NP  Please contact Palliative Medicine Team phone at [phone] for questions and concerns.

## 2023-03-05 NOTE — Plan of Care (Signed)
  Problem: Clinical Measurements: Goal: Will remain free from infection Outcome: Progressing Goal: Diagnostic test results will improve Outcome: Progressing Goal: Respiratory complications will improve Outcome: Progressing Goal: Cardiovascular complication will be avoided Outcome: Progressing   Problem: Coping: Goal: Level of anxiety will decrease Outcome: Progressing   Problem: Elimination: Goal: Will not experience complications related to bowel motility Outcome: Progressing Goal: Will not experience complications related to urinary retention Outcome: Progressing   Problem: Pain Managment: Goal: General experience of comfort will improve Outcome: Progressing   Problem: Safety: Goal: Ability to remain free from injury will improve Outcome: Progressing   Problem: Skin Integrity: Goal: Risk for impaired skin integrity will decrease Outcome: Progressing   Problem: Coping: Goal: Ability to identify appropriate support needs will improve Outcome: Progressing

## 2023-03-05 NOTE — TOC Transition Note (Signed)
Transition of Care [facility]) - CM/SW Discharge Note   Patient Details  Name: Katherine Fields MRN: [id number] Date of Birth: Dec 25, 1965  [facility]) CM/SW Contact:  Truddie Hidden, RN Phone Number: 03/05/2023, 3:15 PM   Clinical Narrative:    Per Ree Kida from [facility] patient is approved for hospice IPU and will admit today.  Face sheet and medical necessity forms have been printed to the floor for EMS transport.    TOC signing off           Patient Goals and CMS Choice      Discharge Placement                         Discharge Plan and Services Additional resources added to the After Visit Summary for                                       Social Determinants of Health (SDOH) Interventions SDOH Screenings   Food Insecurity: Patient Unable To Answer (02/24/2023)  Housing: Patient Unable To Answer (02/24/2023)  Transportation Needs: Patient Unable To Answer (02/24/2023)  Utilities: Patient Unable To Answer (02/24/2023)  Financial Resource Strain: Low Risk  (07/28/2021)   Received from [facility]  Tobacco Use: Low Risk  (02/24/2023)     Readmission Risk Interventions     No data to display

## 2023-03-05 NOTE — Progress Notes (Signed)
Nutrition Brief Note  Chart reviewed. Pt now transitioning to comfort care.  No further nutrition interventions planned at this time.  Please re-consult as needed.   Levada Schilling, RD, LDN, CDCES Registered Dietitian III Certified Diabetes Care and Education Specialist Please refer to [facility] for RD and/or RD on-call/weekend/after hours pager
